# Patient Record
Sex: Male | Born: 1955 | Race: White | Hispanic: No | Marital: Married | Smoking: Never smoker
Health system: Southern US, Community
[De-identification: ages and names within clinical notes are randomized; demographics above are authoritative.]

## PROBLEM LIST (undated history)

## (undated) DIAGNOSIS — F419 Anxiety disorder, unspecified: Secondary | ICD-10-CM

## (undated) DIAGNOSIS — T8859XA Other complications of anesthesia, initial encounter: Secondary | ICD-10-CM

## (undated) DIAGNOSIS — Z860101 Personal history of adenomatous and serrated colon polyps: Secondary | ICD-10-CM

## (undated) DIAGNOSIS — M1990 Unspecified osteoarthritis, unspecified site: Secondary | ICD-10-CM

## (undated) DIAGNOSIS — F909 Attention-deficit hyperactivity disorder, unspecified type: Secondary | ICD-10-CM

## (undated) DIAGNOSIS — Z87898 Personal history of other specified conditions: Secondary | ICD-10-CM

## (undated) DIAGNOSIS — J302 Other seasonal allergic rhinitis: Secondary | ICD-10-CM

## (undated) DIAGNOSIS — Z9889 Other specified postprocedural states: Secondary | ICD-10-CM

## (undated) DIAGNOSIS — E039 Hypothyroidism, unspecified: Secondary | ICD-10-CM

## (undated) DIAGNOSIS — Z86010 Personal history of colonic polyps: Secondary | ICD-10-CM

## (undated) DIAGNOSIS — T4145XA Adverse effect of unspecified anesthetic, initial encounter: Secondary | ICD-10-CM

## (undated) DIAGNOSIS — I10 Essential (primary) hypertension: Secondary | ICD-10-CM

## (undated) DIAGNOSIS — C801 Malignant (primary) neoplasm, unspecified: Secondary | ICD-10-CM

## (undated) DIAGNOSIS — F32A Depression, unspecified: Secondary | ICD-10-CM

## (undated) DIAGNOSIS — Z85828 Personal history of other malignant neoplasm of skin: Secondary | ICD-10-CM

## (undated) DIAGNOSIS — F329 Major depressive disorder, single episode, unspecified: Secondary | ICD-10-CM

## (undated) HISTORY — DX: Essential (primary) hypertension: I10

## (undated) HISTORY — PX: POLYPECTOMY: SHX149

## (undated) HISTORY — PX: CHOLECYSTECTOMY: SHX55

## (undated) HISTORY — DX: Hypothyroidism, unspecified: E03.9

## (undated) HISTORY — DX: Unspecified osteoarthritis, unspecified site: M19.90

## (undated) HISTORY — PX: TOE OSTEOTOMY: SHX1071

## (undated) HISTORY — PX: COLONOSCOPY: SHX174

---

## 1982-09-14 HISTORY — PX: INGUINAL HERNIA REPAIR: SUR1180

## 2011-12-02 ENCOUNTER — Encounter: Payer: Self-pay | Admitting: Internal Medicine

## 2011-12-23 ENCOUNTER — Ambulatory Visit: Payer: Self-pay | Admitting: Internal Medicine

## 2011-12-30 ENCOUNTER — Encounter: Payer: Self-pay | Admitting: Internal Medicine

## 2012-01-01 ENCOUNTER — Encounter: Payer: Self-pay | Admitting: Internal Medicine

## 2012-01-01 ENCOUNTER — Ambulatory Visit (INDEPENDENT_AMBULATORY_CARE_PROVIDER_SITE_OTHER): Payer: BC Managed Care – PPO | Admitting: Internal Medicine

## 2012-01-01 VITALS — BP 130/86 | HR 82 | Ht 68.0 in | Wt 201.0 lb

## 2012-01-01 DIAGNOSIS — Z86010 Personal history of colonic polyps: Secondary | ICD-10-CM | POA: Insufficient documentation

## 2012-01-01 DIAGNOSIS — E039 Hypothyroidism, unspecified: Secondary | ICD-10-CM | POA: Insufficient documentation

## 2012-01-01 DIAGNOSIS — Z1211 Encounter for screening for malignant neoplasm of colon: Secondary | ICD-10-CM

## 2012-01-01 DIAGNOSIS — I10 Essential (primary) hypertension: Secondary | ICD-10-CM | POA: Insufficient documentation

## 2012-01-01 MED ORDER — PEG-KCL-NACL-NASULF-NA ASC-C 100 G PO SOLR: 1.0000 | Freq: Once | ORAL | Status: DC

## 2012-01-01 NOTE — Patient Instructions (Signed)
.  You have been scheduled for a colonoscopy with propofol. Please follow written instructions given to you at your visit today.  Please pick up your prep kit at the pharmacy within the next 1-3 days.  We have sent the following medications to your pharmacy for you to pick up at your convenience: moviprep

## 2012-01-01 NOTE — Progress Notes (Signed)
Subjective:    Patient ID: Robert Blair, male    DOB: 02/16/1956, 56 y.o.   MRN: [id number]  HPI Robert Blair is a 56 yo male with PMH of hypertension and hypothyroidism who is seen given in consultation from Dr. Su Hilt given his history of colon polyps. The patient is doing well today, without GI complaint. He does report very rare scant bright red blood on the toilet tissue. He feels this is due to perianal inflammation/irritation. He says this is worse when he eats a high fiber diet, particularly raw vegetables. Bowel habits for him to been normal, which consists of 4-5 bowel movements per day. Stools are soft but formed. No diarrhea or constipation. No abdominal pain. No change in appetite. No nausea or vomiting. No weight loss. He was recently diagnosed with hypothyroidism, and he was started on levothyroxine and the doses are being adjusted.  He reports having had a colonoscopy 5 years ago, with removal of 2 benign colon polyps. He was recommended that he repeat the colonoscopy in 5 years time.  This was performed by Dr. Matthias Hughs.  Review of Systems As per history of present illness, otherwise negative except for arthritis of great toe  Past Medical History  Diagnosis Date  . Unspecified essential hypertension   . Hypothyroidism   . Personal history of colonic polyps     Benign   Past Surgical History  Procedure Date  . Hernia repair     1984   Current Outpatient Prescriptions  Medication Sig Dispense Refill  . levothyroxine (SYNTHROID, LEVOTHROID) 50 MCG tablet Take 50 mcg by mouth daily.      . valsartan-hydrochlorothiazide (DIOVAN HCT) 320-12.5 MG per tablet Take 1 tablet by mouth daily.      . peg 3350 powder (MOVIPREP) SOLR Take 1 kit (100 g total) by mouth once.  1 kit  0   No Known Allergies  Family History  Problem Relation Age of Onset  . Breast cancer    . Colon polyps -- father     History  Substance Use Topics  . Smoking status: Never Smoker   . Smokeless  tobacco: Never Used  . Alcohol Use: No       Objective:   Physical Exam BP 130/86  Pulse 82  Ht 5\' 8"  (1.727 m)  Wt 201 lb (91.173 kg)  BMI 30.56 kg/m2 Constitutional: Well-developed and well-nourished. No distress. HEENT: Normocephalic and atraumatic. Oropharynx is clear and moist. No oropharyngeal exudate. Conjunctivae are normal. Pupils are equal round and reactive to light. No scleral icterus. Neck: Neck supple. Trachea midline. Cardiovascular: Normal rate, regular rhythm and intact distal pulses. No M/R/G Pulmonary/chest: Effort normal and breath sounds normal. No wheezing, rales or rhonchi. Abdominal: Soft, nontender, nondistended. Bowel sounds active throughout. There are no masses palpable. No hepatosplenomegaly. Extremities: no clubbing, cyanosis, or edema Lymphadenopathy: No cervical adenopathy noted. Neurological: Alert and oriented to person place and time. Skin: Skin is warm and dry. No rashes noted. Psychiatric: Normal mood and affect. Behavior is normal.    Assessment & Plan:  Robert Blair is a 56 yo male with PMH of hypertension and hypothyroidism who is seen given in consultation from Dr. Su Hilt given his history of colon polyps  1. History of colon polyps -- given the recommendation for repeat exam in 5 years, colon polyps that were removed were likely adenomatous. We will track down the colonoscopy and pathology records to confirm this. He is without symptoms today, he does have occasional scant rectal bleeding  which is likely due to perianal inflammation. He is not having bleeding now. I recommend repeating the colonoscopy, given that it has been 5 years since his last exam. We discussed the test today including the risks and benefits and he is agreeable to proceed. Time was provided for questions and answers.

## 2012-01-13 ENCOUNTER — Telehealth: Payer: Self-pay | Admitting: Gastroenterology

## 2012-01-13 NOTE — Telephone Encounter (Signed)
Called pt to confirm appointment time change from 2pm to 10:30am on 01/14/2012, LVM

## 2012-01-14 ENCOUNTER — Encounter: Payer: Self-pay | Admitting: Internal Medicine

## 2012-01-14 ENCOUNTER — Telehealth: Payer: Self-pay

## 2012-01-14 ENCOUNTER — Ambulatory Visit (AMBULATORY_SURGERY_CENTER): Payer: BC Managed Care – PPO | Admitting: Internal Medicine

## 2012-01-14 VITALS — BP 136/89 | HR 70 | Temp 96.7°F | Resp 16 | Ht 68.0 in | Wt 201.0 lb

## 2012-01-14 DIAGNOSIS — Z1211 Encounter for screening for malignant neoplasm of colon: Secondary | ICD-10-CM

## 2012-01-14 DIAGNOSIS — D126 Benign neoplasm of colon, unspecified: Secondary | ICD-10-CM

## 2012-01-14 DIAGNOSIS — Z860100 Personal history of colon polyps, unspecified: Secondary | ICD-10-CM

## 2012-01-14 DIAGNOSIS — D128 Benign neoplasm of rectum: Secondary | ICD-10-CM

## 2012-01-14 DIAGNOSIS — D129 Benign neoplasm of anus and anal canal: Secondary | ICD-10-CM

## 2012-01-14 MED ORDER — SODIUM CHLORIDE 0.9 % IV SOLN: 500.0000 mL | INTRAVENOUS | Status: DC

## 2012-01-14 NOTE — Progress Notes (Signed)
Patient did not have preoperative order for IV antibiotic SSI prophylaxis. (G8918)  Patient did not experience any of the following events: a burn prior to discharge; a fall within the facility; wrong site/side/patient/procedure/implant event; or a hospital transfer or hospital admission upon discharge from the facility. (G8907)  

## 2012-01-14 NOTE — Op Note (Signed)
[facility] [address] [place], [place]  [address]  COLONOSCOPY PROCEDURE REPORT  PATIENT:  Robert Blair, Robert Blair  MR#:  [id number] BIRTHDATE:  November 20, 1955, 56 yrs. old  GENDER:  male ENDOSCOPIST:  Carie Caddy. Dhairya Corales, MD REF. BY:  Burton Apley, M.D. PROCEDURE DATE:  01/14/2012 PROCEDURE:  Colonoscopy with snare polypectomy, Colon with cold biopsy polypectomy ASA CLASS:  Class II INDICATIONS:  surveillance and high-risk screening, history of colon polyps, last colonoscopy 5 years ago MEDICATIONS:   MAC sedation, administered by CRNA, propofol (Diprivan) 300 mg IV  DESCRIPTION OF PROCEDURE:   After the risks benefits and alternatives of the procedure were thoroughly explained, informed consent was obtained.  Digital rectal exam was performed and revealed no rectal masses.   The LB [id number] endoscope was introduced through the anus and advanced to the terminal ileum which was intubated for a short distance, without limitations. The quality of the prep was good, using MoviPrep.  The instrument was then slowly withdrawn as the colon was fully examined. <<PROCEDUREIMAGES>>  FINDINGS:  The terminal ileum appeared normal.  A 2 mm sessile polyp was found at the appendiceal orifice. Polyp was snared without cautery. Retrieval was successful.  Four sessile polyps measuring 3 - 5 mm were found in the ascending colon. Polyps were snared without cautery. Retrieval was successful.  Two sessile polyps measuring 3 - 5 mm were found in the descending colon. Polyp was snared without cautery. Retrieval was successful.  The polyp was removed using cold biopsy forceps.  Mild diverticulosis was found in the left colon.   Retroflexed views in the rectum revealed small internal hemorrhoids.   The scope was then withdrawn from the cecum and the procedure completed.  COMPLICATIONS:  None ENDOSCOPIC IMPRESSION: 1) Normal terminal ileum 2) Sessile polyp at the appendiceal orifice.  Removed  and sent to pathology. 3) Four polyps in the ascending colon.  Removed and sent to pathology. 4) Two polyps in the descending colon/  Removed and sent to pathology. 5) Mild diverticulosis in the left colon 6) Small internal hemorrhoids  RECOMMENDATIONS: 1) Hold aspirin, aspirin products, and anti-inflammatory medication for 1 week. 2) Await pathology results 3) High fiber diet. 4) If the polyps removed today are proven to be adenomatous (pre-cancerous) polyps, you will need a colonoscopy in 3 years. Otherwise you should have a repeat colonoscopy in 5 years given your history of previous colon polyps. You will receive a letter within 1-2 weeks with the results of your biopsy as well as final recommendations. Please call my office if you have not received a letter after 3 weeks.  Carie Caddy. Rhea Belton, MD  CC:  The Patient Burton Apley, MD  n. Rosalie DoctorCarie Caddy. Tieasha Larsen at 01/14/2012 11:12 AM  Adair Laundry, [phone]

## 2012-01-14 NOTE — Patient Instructions (Signed)
YOU HAD AN ENDOSCOPIC PROCEDURE TODAY AT THE [facility]: Refer to the procedure report that was given to you for any specific questions about what was found during the examination.  If the procedure report does not answer your questions, please call your gastroenterologist to clarify.  If you requested that your care partner not be given the details of your procedure findings, then the procedure report has been included in a sealed envelope for you to review at your convenience later.  YOU SHOULD EXPECT: Some feelings of bloating in the abdomen. Passage of more gas than usual.  Walking can help get rid of the air that was put into your GI tract during the procedure and reduce the bloating. If you had a lower endoscopy (such as a colonoscopy or flexible sigmoidoscopy) you may notice spotting of blood in your stool or on the toilet paper. If you underwent a bowel prep for your procedure, then you may not have a normal bowel movement for a few days.  DIET: Your first meal following the procedure should be a light meal and then it is ok to progress to your normal diet.  A half-sandwich or bowl of soup is an example of a good first meal.  Heavy or fried foods are harder to digest and may make you feel nauseous or bloated.  Likewise meals heavy in dairy and vegetables can cause extra gas to form and this can also increase the bloating.  Drink plenty of fluids but you should avoid alcoholic beverages for 24 hours.  ACTIVITY: Your care partner should take you home directly after the procedure.  You should plan to take it easy, moving slowly for the rest of the day.  You can resume normal activity the day after the procedure however you should NOT DRIVE or use heavy machinery for 24 hours (because of the sedation medicines used during the test).    SYMPTOMS TO REPORT IMMEDIATELY: A gastroenterologist can be reached at any hour.  During normal business hours, 8:30 AM to 5:00 PM Monday through Friday,  call [phone].  After hours and on weekends, please call the GI answering service at [phone] who will take a message and have the physician on call contact you.   Following lower endoscopy (colonoscopy or flexible sigmoidoscopy):  Excessive amounts of blood in the stool  Significant tenderness or worsening of abdominal pains  Swelling of the abdomen that is new, acute  Fever of 100F or higher    FOLLOW UP: If any biopsies were taken you will be contacted by phone or by letter within the next 1-3 weeks.  Call your gastroenterologist if you have not heard about the biopsies in 3 weeks.  Our staff will call the home number listed on your records the next business day following your procedure to check on you and address any questions or concerns that you may have at that time regarding the information given to you following your procedure. This is a courtesy call and so if there is no answer at the home number and we have not heard from you through the emergency physician on call, we will assume that you have returned to your regular daily activities without incident.  SIGNATURES/CONFIDENTIALITY: You and/or your care partner have signed paperwork which will be entered into your electronic medical record.  These signatures attest to the fact that that the information above on your After Visit Summary has been reviewed and is understood.  Full responsibility of the confidentiality   of this discharge information lies with you and/or your care-partner.     

## 2012-01-14 NOTE — Telephone Encounter (Signed)
Verified patient knew about his appointment time change;  pt acknowledged the change

## 2012-01-15 ENCOUNTER — Telehealth: Payer: Self-pay | Admitting: *Deleted

## 2012-01-15 NOTE — Telephone Encounter (Signed)
  Follow up Call-  Call back number 01/14/2012  Post procedure Call Back phone  # [phone]  Permission to leave phone message Yes     Patient questions:  Do you have a fever, pain , or abdominal swelling? no Pain Score  0 *  Have you tolerated food without any problems? yes  Have you been able to return to your normal activities? yes  Do you have any questions about your discharge instructions: Diet   no Medications  no Follow up visit  no  Do you have questions or concerns about your Care? no  Actions: * If pain score is 4 or above: No action needed, pain <4.

## 2012-01-19 ENCOUNTER — Encounter: Payer: Self-pay | Admitting: Internal Medicine

## 2013-01-06 DIAGNOSIS — L988 Other specified disorders of the skin and subcutaneous tissue: Secondary | ICD-10-CM | POA: Insufficient documentation

## 2013-01-06 DIAGNOSIS — L574 Cutis laxa senilis: Secondary | ICD-10-CM | POA: Insufficient documentation

## 2013-07-07 DIAGNOSIS — Z0000 Encounter for general adult medical examination without abnormal findings: Secondary | ICD-10-CM | POA: Insufficient documentation

## 2014-05-16 ENCOUNTER — Other Ambulatory Visit: Payer: Self-pay | Admitting: Internal Medicine

## 2014-05-16 ENCOUNTER — Ambulatory Visit
Admission: RE | Admit: 2014-05-16 | Discharge: 2014-05-16 | Disposition: A | Discharge status: Home / Self Care | Payer: BC Managed Care – PPO | Source: Ambulatory Visit | Attending: Internal Medicine | Admitting: Internal Medicine

## 2014-05-16 DIAGNOSIS — M79645 Pain in left finger(s): Secondary | ICD-10-CM

## 2014-05-16 DIAGNOSIS — W19XXXA Unspecified fall, initial encounter: Secondary | ICD-10-CM

## 2014-09-14 DIAGNOSIS — C801 Malignant (primary) neoplasm, unspecified: Secondary | ICD-10-CM

## 2014-09-14 HISTORY — DX: Malignant (primary) neoplasm, unspecified: C80.1

## 2014-10-03 ENCOUNTER — Other Ambulatory Visit (INDEPENDENT_AMBULATORY_CARE_PROVIDER_SITE_OTHER): Payer: Self-pay | Admitting: General Surgery

## 2014-10-23 ENCOUNTER — Other Ambulatory Visit (HOSPITAL_COMMUNITY): Payer: Self-pay | Admitting: *Deleted

## 2014-10-24 ENCOUNTER — Encounter (HOSPITAL_COMMUNITY)
Admission: RE | Admit: 2014-10-24 | Discharge: 2014-10-24 | Disposition: A | Discharge status: Home / Self Care | Payer: BLUE CROSS/BLUE SHIELD | Source: Ambulatory Visit | Attending: General Surgery | Admitting: General Surgery

## 2014-10-24 ENCOUNTER — Encounter (HOSPITAL_COMMUNITY): Payer: Self-pay

## 2014-10-24 DIAGNOSIS — Z01818 Encounter for other preprocedural examination: Secondary | ICD-10-CM | POA: Diagnosis not present

## 2014-10-24 DIAGNOSIS — K4091 Unilateral inguinal hernia, without obstruction or gangrene, recurrent: Secondary | ICD-10-CM | POA: Diagnosis not present

## 2014-10-24 HISTORY — DX: Malignant (primary) neoplasm, unspecified: C80.1

## 2014-10-24 HISTORY — DX: Major depressive disorder, single episode, unspecified: F32.9

## 2014-10-24 HISTORY — DX: Depression, unspecified: F32.A

## 2014-10-24 LAB — CBC WITH DIFFERENTIAL/PLATELET
Basophils Absolute: 0 10*3/uL (ref 0.0–0.1)
Basophils Relative: 0 % (ref 0–1)
Eosinophils Absolute: 0.2 10*3/uL (ref 0.0–0.7)
Eosinophils Relative: 2 % (ref 0–5)
HEMATOCRIT: 45.5 % (ref 39.0–52.0)
Hemoglobin: 15.8 g/dL (ref 13.0–17.0)
LYMPHS ABS: 2.1 10*3/uL (ref 0.7–4.0)
LYMPHS PCT: 23 % (ref 12–46)
MCH: 30.2 pg (ref 26.0–34.0)
MCHC: 34.7 g/dL (ref 30.0–36.0)
MCV: 86.8 fL (ref 78.0–100.0)
Monocytes Absolute: 0.8 10*3/uL (ref 0.1–1.0)
Monocytes Relative: 9 % (ref 3–12)
Neutro Abs: 6 10*3/uL (ref 1.7–7.7)
Neutrophils Relative %: 66 % (ref 43–77)
PLATELETS: 243 10*3/uL (ref 150–400)
RBC: 5.24 MIL/uL (ref 4.22–5.81)
RDW: 13.5 % (ref 11.5–15.5)
WBC: 9.1 10*3/uL (ref 4.0–10.5)

## 2014-10-24 LAB — COMPREHENSIVE METABOLIC PANEL
ALBUMIN: 4.5 g/dL (ref 3.5–5.2)
ALT: 46 U/L (ref 0–53)
ANION GAP: 7 (ref 5–15)
AST: 33 U/L (ref 0–37)
Alkaline Phosphatase: 72 U/L (ref 39–117)
BUN: 19 mg/dL (ref 6–23)
CALCIUM: 9.8 mg/dL (ref 8.4–10.5)
CO2: 32 mmol/L (ref 19–32)
CREATININE: 1.12 mg/dL (ref 0.50–1.35)
Chloride: 101 mmol/L (ref 96–112)
GFR calc Af Amer: 81 mL/min — ABNORMAL LOW (ref >90)
GFR, EST NON AFRICAN AMERICAN: 70 mL/min — AB (ref >90)
Glucose, Bld: 97 mg/dL (ref 70–99)
Potassium: 3.3 mmol/L — ABNORMAL LOW (ref 3.5–5.1)
Sodium: 140 mmol/L (ref 135–145)
Total Bilirubin: 0.7 mg/dL (ref 0.3–1.2)
Total Protein: 7.5 g/dL (ref 6.0–8.3)

## 2014-10-24 NOTE — Patient Instructions (Signed)
[place]  10/24/2014   Your procedure is scheduled on:    Monday October 29, 2014   Report to [facility] Main Entrance and follow signs to  [place] arrive at 5:30 AM.  Call this number if you have problems the morning of surgery [phone] or Presurgical Testing [phone].   Remember:  Do not eat food or drink liquids :After Midnight.  For Living Will and/or Health Care Power Attorney Forms: please provide copy for your medical record, may bring AM of surgery (forms should be already notarized-we do not provide this service).     Take these medicines the morning of surgery with A SIP OF WATER: Amlodipine;Bupropion (Wellbutrin);Levothyroxine                                You may not have any metal on your body including hair pins and piercings  Do not wear jewelry, cologne, lotions, powders, or deodorant.  Men may shave face and neck.               Do not bring valuables to the hospital. [place].  Contacts, dentures or bridgework may not be worn into surgery.    Patients discharged the day of surgery will not be allowed to drive home.  Name and phone number of your driver:Brenda Chana Bode (friend) or Beola Cord (girlfriend)  ________________________________________________________________________  [facility] - Preparing for Surgery Before surgery, you can play an important role.  Because skin is not sterile, your skin needs to be as free of germs as possible.  You can reduce the number of germs on your skin by washing with CHG (chlorahexidine gluconate) soap before surgery.  CHG is an antiseptic cleaner which kills germs and bonds with the skin to continue killing germs even after washing. Please DO NOT use if you have an allergy to CHG or antibacterial soaps.  If your skin becomes reddened/irritated stop using the CHG and inform your nurse when you arrive at Short Stay. Do not shave (including legs and underarms)  for at least 48 hours prior to the first CHG shower.  You may shave your face/neck. Please follow these instructions carefully:  1.  Shower with CHG Soap the night before surgery and the  morning of Surgery.  2.  If you choose to wash your hair, wash your hair first as usual with your  normal  shampoo.  3.  After you shampoo, rinse your hair and body thoroughly to remove the  shampoo.                           4.  Use CHG as you would any other liquid soap.  You can apply chg directly  to the skin and wash                       Gently with a scrungie or clean washcloth.  5.  Apply the CHG Soap to your body ONLY FROM THE NECK DOWN.   Do not use on face/ open                           Wound or open sores. Avoid contact with eyes, ears mouth and genitals (private parts).  Wash face,  Genitals (private parts) with your normal soap.             6.  Wash thoroughly, paying special attention to the area where your surgery  will be performed.  7.  Thoroughly rinse your body with warm water from the neck down.  8.  DO NOT shower/wash with your normal soap after using and rinsing off  the CHG Soap.                9.  Pat yourself dry with a clean towel.            10.  Wear clean pajamas.            11.  Place clean sheets on your bed the night of your first shower and do not  sleep with pets. Day of Surgery : Do not apply any lotions/deodorants the morning of surgery.  Please wear clean clothes to the hospital/surgery center.  FAILURE TO FOLLOW THESE INSTRUCTIONS MAY RESULT IN THE CANCELLATION OF YOUR SURGERY PATIENT SIGNATURE_________________________________  NURSE SIGNATURE__________________________________  ________________________________________________________________________

## 2014-10-24 NOTE — Progress Notes (Signed)
EKG per chart 11/30/2013 per Dr Mancel Bale

## 2014-10-28 NOTE — H&P (Signed)
Robert Blair [facility]  Location: [facility] Patient #: [id number] DOB: August 29, 1956 Single / Language: Cleophus Molt / Race: White Male      History of Present Illness  Patient words: RIH.  The patient is a 58 year old male who presents with a complaint of recurrent right inguinal hernia. This is a very pleasant, 59 year old Caucasian male, referred by Dr. [name] for evaluation and management of a symptomatic, recurrent right inguinal hernia. The patient underwent open repair of right inguinal hernia with mesh, per his report, in the mid 80s. He states that he has known he had a recurrence for about 2 years but it did not bother him. He underwent repair of a right foot fracture in September 2015. He was trying to get back in his SUV and slipped and felt increased pain in his right groin and the bulge was much bigger. He's been wearing a truss and feels much better now. No nausea vomiting or abdominal pain. Comorbidities include hypertension and hypothyroidism. No history of abdominal surgery.   Addendum Note  I have discussed the indications, details, techniques and numerous risk of inguinal hernia surgery with him. He is aware of the risk of bleeding, infection, recurrence, nerve damage with chronic pain, open laparotomy, and other unforeseen problems. All of his questions were answered. He understands all of these issues. He agrees with this plan.    Other Problems  Depression Inguinal Hernia Thyroid Disease  Past Surgical History Colon Polyp Removal - Colonoscopy Oral Surgery  Diagnostic Studies History  Colonoscopy 1-5 years ago  Allergies  No Known Drug Allergies01/20/2016  Medication History  AmLODIPine Besylate (10MG  Tablet, Oral) Active. BuPROPion HCl ER (XL) (150MG  Tablet ER 24HR, Oral) Active. Levothyroxine Sodium (100MCG Tablet, Oral) Active. Valsartan-Hydrochlorothiazide (320-25MG  Tablet, Oral) Active.  Social History  Alcohol  use Occasional alcohol use. Caffeine use Tea. No drug use Tobacco use Never smoker.  Family History  Alcohol Abuse Father. Breast Cancer Mother. Cerebrovascular Accident Father. Colon Polyps Father. Depression Mother. Heart Disease Mother. Heart disease in male family member before age 48 Hypertension Father, Mother.  Review of Systems General Not Present- Appetite Loss, Chills, Fatigue, Fever, Night Sweats, Weight Gain and Weight Loss. Skin Not Present- Change in Wart/Mole, Dryness, Hives, Jaundice, New Lesions, Non-Healing Wounds, Rash and Ulcer. HEENT Not Present- Earache, Hearing Loss, Hoarseness, Nose Bleed, Oral Ulcers, Ringing in the Ears, Seasonal Allergies, Sinus Pain, Sore Throat, Visual Disturbances, Wears glasses/contact lenses and Yellow Eyes. Respiratory Not Present- Bloody sputum, Chronic Cough, Difficulty Breathing, Snoring and Wheezing. Breast Not Present- Breast Mass, Breast Pain, Nipple Discharge and Skin Changes. Cardiovascular Not Present- Chest Pain, Difficulty Breathing Lying Down, Leg Cramps, Palpitations, Rapid Heart Rate, Shortness of Breath and Swelling of Extremities. Gastrointestinal Not Present- Abdominal Pain, Bloating, Bloody Stool, Change in Bowel Habits, Chronic diarrhea, Constipation, Difficulty Swallowing, Excessive gas, Gets full quickly at meals, Hemorrhoids, Indigestion, Nausea, Rectal Pain and Vomiting. Male Genitourinary Not Present- Blood in Urine, Change in Urinary Stream, Frequency, Impotence, Nocturia, Painful Urination, Urgency and Urine Leakage.   Vitals   Weight: 198 lb Height: 68in Body Surface Area: 2.08 m Body Mass Index: 30.11 kg/m Pulse: 80 (Regular)  BP: 126/78 (Sitting, Left Arm, Standard)    Physical Exam  General Mental Status-Alert. General Appearance-Consistent with stated age. Hydration-Well hydrated. Voice-Normal.  Head and Neck Head-normocephalic, atraumatic with no lesions or  palpable masses. Trachea-midline. Thyroid Gland Characteristics - normal size and consistency.  Eye Eyeball - Bilateral-Extraocular movements intact. Sclera/Conjunctiva - Bilateral-No scleral icterus.  Chest and Lung Exam Chest and lung exam reveals -quiet, even and easy respiratory effort with no use of accessory muscles and on auscultation, normal breath sounds, no adventitious sounds and normal vocal resonance. Inspection Chest Wall - Normal. Back - normal.  Cardiovascular Cardiovascular examination reveals -normal heart sounds, regular rate and rhythm with no murmurs and normal pedal pulses bilaterally.  Abdomen Inspection Inspection of the abdomen reveals - No Hernias. Skin - Scar - no surgical scars. Palpation/Percussion Palpation and Percussion of the abdomen reveal - Soft, Non Tender, No Rebound tenderness, No Rigidity (guarding) and No hepatosplenomegaly. Auscultation Auscultation of the abdomen reveals - Bowel sounds normal. Note: Umbilicus normal. No hernia. No scars.   Male Genitourinary Note: Moderate sized right inguinal hernia. Obvious when standing. Does not extend into the scrotum but comes close. A little bit tender but easily reducible. No hernia on the left. No scrotal or testicular mass. Well-healed transverse scar right inguinal area consistent with his history of open repair   Neurologic Neurologic evaluation reveals -alert and oriented x 3 with no impairment of recent or remote memory. Mental Status-Normal.  Musculoskeletal Normal Exam - Left-Upper Extremity Strength Normal and Lower Extremity Strength Normal. Normal Exam - Right-Upper Extremity Strength Normal and Lower Extremity Strength Normal.  Lymphatic Head & Neck  General Head & Neck Lymphatics: Bilateral - Description - Normal. Axillary  General Axillary Region: Bilateral - Description - Normal. Tenderness - Non Tender. Femoral & Inguinal  Generalized Femoral &  Inguinal Lymphatics: Bilateral - Description - Normal. Tenderness - Non Tender.    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 you will be scheduled for laparoscopic repair of your recurrent right inguinal hernia with mesh, possible open repair in the near future We have discussed the techniques and risks of this surgery in detail Please read the patient information booklets that I gave you.  HYPERTENSION, BENIGN (Can not exclude) (401.1  I10) Impression: Controlled on Diovan HCT  HISTORY OF RIGHT INGUINAL HERNIA REPAIR (V45.89  U98.11) Impression: Lichtenstein repair with mesh, 1980s, per history  HYPOTHYROIDISM, UNSPECIFIED TYPE (244.9  E03.9)   Jeymi Hepp M. Dalbert Batman, M.D., [facility] General and Minimally invasive Surgery Breast and Colorectal Surgery Office:   [phone] Pager:   [phone]

## 2014-10-28 NOTE — Anesthesia Preprocedure Evaluation (Addendum)
Anesthesia Evaluation  Patient identified by MRN, date of birth, ID band Patient awake    Reviewed: Allergy & Precautions, H&P , NPO status , Patient's Chart, lab work & pertinent test results  Airway Mallampati: II  TM Distance: >3 FB Neck ROM: full    Dental no notable dental hx. (+) Teeth Intact, Dental Advisory Given   Pulmonary neg pulmonary ROS,  breath sounds clear to auscultation  Pulmonary exam normal       Cardiovascular Exercise Tolerance: Good hypertension, Pt. on medications Rhythm:regular Rate:Normal     Neuro/Psych negative neurological ROS  negative psych ROS   GI/Hepatic negative GI ROS, Neg liver ROS,   Endo/Other  negative endocrine ROSHypothyroidism   Renal/GU negative Renal ROS  negative genitourinary   Musculoskeletal   Abdominal   Peds  Hematology negative hematology ROS (+)   Anesthesia Other Findings   Reproductive/Obstetrics negative OB ROS                            Anesthesia Physical Anesthesia Plan  ASA: II  Anesthesia Plan: General   Post-op Pain Management:    Induction: Intravenous  Airway Management Planned: Oral ETT  Additional Equipment:   Intra-op Plan:   Post-operative Plan: Extubation in OR  Informed Consent: I have reviewed the patients History and Physical, chart, labs and discussed the procedure including the risks, benefits and alternatives for the proposed anesthesia with the patient or authorized representative who has indicated his/her understanding and acceptance.   Dental Advisory Given  Plan Discussed with: CRNA and Surgeon  Anesthesia Plan Comments:         Anesthesia Quick Evaluation

## 2014-10-29 ENCOUNTER — Ambulatory Visit (HOSPITAL_COMMUNITY): Payer: BLUE CROSS/BLUE SHIELD | Admitting: Anesthesiology

## 2014-10-29 ENCOUNTER — Encounter (HOSPITAL_COMMUNITY)
Admission: RE | Disposition: A | Discharge status: Home / Self Care | Payer: Self-pay | Source: Ambulatory Visit | Attending: General Surgery

## 2014-10-29 ENCOUNTER — Encounter (HOSPITAL_COMMUNITY): Payer: Self-pay | Admitting: Anesthesiology

## 2014-10-29 ENCOUNTER — Ambulatory Visit (HOSPITAL_COMMUNITY)
Admission: RE | Admit: 2014-10-29 | Discharge: 2014-10-29 | Disposition: A | Discharge status: Home / Self Care | Payer: BLUE CROSS/BLUE SHIELD | Source: Ambulatory Visit | Attending: General Surgery | Admitting: General Surgery

## 2014-10-29 DIAGNOSIS — I10 Essential (primary) hypertension: Secondary | ICD-10-CM | POA: Insufficient documentation

## 2014-10-29 DIAGNOSIS — F329 Major depressive disorder, single episode, unspecified: Secondary | ICD-10-CM | POA: Diagnosis not present

## 2014-10-29 DIAGNOSIS — Z79899 Other long term (current) drug therapy: Secondary | ICD-10-CM | POA: Diagnosis not present

## 2014-10-29 DIAGNOSIS — E039 Hypothyroidism, unspecified: Secondary | ICD-10-CM | POA: Insufficient documentation

## 2014-10-29 DIAGNOSIS — Z8249 Family history of ischemic heart disease and other diseases of the circulatory system: Secondary | ICD-10-CM | POA: Insufficient documentation

## 2014-10-29 DIAGNOSIS — K4091 Unilateral inguinal hernia, without obstruction or gangrene, recurrent: Secondary | ICD-10-CM | POA: Diagnosis present

## 2014-10-29 HISTORY — PX: INGUINAL HERNIA REPAIR: SHX194

## 2014-10-29 HISTORY — PX: INSERTION OF MESH: SHX5868

## 2014-10-29 SURGERY — REPAIR, HERNIA, INGUINAL, LAPAROSCOPIC
Anesthesia: General | Site: Groin | Laterality: Right

## 2014-10-29 MED ORDER — BUPIVACAINE-EPINEPHRINE 0.5% -1:200000 IJ SOLN
INTRAMUSCULAR | Status: DC | PRN
  Administered 2014-10-29: 10 mL

## 2014-10-29 MED ORDER — DEXAMETHASONE SODIUM PHOSPHATE 10 MG/ML IJ SOLN
INTRAMUSCULAR | Status: DC | PRN
Start: 2014-10-29 — End: 2014-10-29
  Administered 2014-10-29: 10 mg via INTRAVENOUS

## 2014-10-29 MED ORDER — PROPOFOL 10 MG/ML IV BOLUS
INTRAVENOUS | Status: AC
  Filled 2014-10-29: qty 20

## 2014-10-29 MED ORDER — MIDAZOLAM HCL 2 MG/2ML IJ SOLN
INTRAMUSCULAR | Status: AC
  Filled 2014-10-29: qty 2

## 2014-10-29 MED ORDER — LACTATED RINGERS IV SOLN: INTRAVENOUS | Status: DC

## 2014-10-29 MED ORDER — DEXAMETHASONE SODIUM PHOSPHATE 10 MG/ML IJ SOLN
INTRAMUSCULAR | Status: AC
  Filled 2014-10-29: qty 1

## 2014-10-29 MED ORDER — CHLORHEXIDINE GLUCONATE 4 % EX LIQD: 1.0000 "application " | Freq: Once | CUTANEOUS | Status: DC

## 2014-10-29 MED ORDER — LIDOCAINE HCL (CARDIAC) 20 MG/ML IV SOLN
INTRAVENOUS | Status: AC
  Filled 2014-10-29: qty 5

## 2014-10-29 MED ORDER — SODIUM CHLORIDE 0.9 % IV SOLN: 250.0000 mL | INTRAVENOUS | Status: DC | PRN

## 2014-10-29 MED ORDER — NEOSTIGMINE METHYLSULFATE 10 MG/10ML IV SOLN
INTRAVENOUS | Status: DC | PRN
  Administered 2014-10-29: 4 mg via INTRAVENOUS

## 2014-10-29 MED ORDER — 0.9 % SODIUM CHLORIDE (POUR BTL) OPTIME
TOPICAL | Status: DC | PRN
  Administered 2014-10-29: 1000 mL

## 2014-10-29 MED ORDER — GLYCOPYRROLATE 0.2 MG/ML IJ SOLN
INTRAMUSCULAR | Status: DC | PRN
  Administered 2014-10-29: .8 mg via INTRAVENOUS

## 2014-10-29 MED ORDER — HYDROMORPHONE HCL 1 MG/ML IJ SOLN
INTRAMUSCULAR | Status: DC | PRN
  Administered 2014-10-29: 0.5 mg via INTRAVENOUS

## 2014-10-29 MED ORDER — ROCURONIUM BROMIDE 100 MG/10ML IV SOLN
INTRAVENOUS | Status: AC
  Filled 2014-10-29: qty 1

## 2014-10-29 MED ORDER — PROMETHAZINE HCL 25 MG/ML IJ SOLN
INTRAMUSCULAR | Status: AC
  Filled 2014-10-29: qty 1

## 2014-10-29 MED ORDER — FENTANYL CITRATE 0.05 MG/ML IJ SOLN
INTRAMUSCULAR | Status: DC | PRN
  Administered 2014-10-29 (×2): 50 ug via INTRAVENOUS
  Administered 2014-10-29: 100 ug via INTRAVENOUS

## 2014-10-29 MED ORDER — MIDAZOLAM HCL 5 MG/5ML IJ SOLN
INTRAMUSCULAR | Status: DC | PRN
  Administered 2014-10-29: 2 mg via INTRAVENOUS

## 2014-10-29 MED ORDER — OXYCODONE-ACETAMINOPHEN 7.5-325 MG PO TABS: 1.0000 | ORAL_TABLET | ORAL | Status: DC | PRN

## 2014-10-29 MED ORDER — GLYCOPYRROLATE 0.2 MG/ML IJ SOLN
INTRAMUSCULAR | Status: AC
  Filled 2014-10-29: qty 4

## 2014-10-29 MED ORDER — LACTATED RINGERS IR SOLN
Status: DC | PRN
  Administered 2014-10-29: 1000 mL

## 2014-10-29 MED ORDER — PROPOFOL 10 MG/ML IV BOLUS
INTRAVENOUS | Status: DC | PRN
  Administered 2014-10-29: 200 mg via INTRAVENOUS

## 2014-10-29 MED ORDER — ONDANSETRON HCL 4 MG/2ML IJ SOLN
INTRAMUSCULAR | Status: AC
  Filled 2014-10-29: qty 2

## 2014-10-29 MED ORDER — LIDOCAINE HCL (CARDIAC) 20 MG/ML IV SOLN
INTRAVENOUS | Status: DC | PRN
  Administered 2014-10-29: 50 mg via INTRAVENOUS

## 2014-10-29 MED ORDER — FENTANYL CITRATE 0.05 MG/ML IJ SOLN
INTRAMUSCULAR | Status: AC
  Filled 2014-10-29: qty 5

## 2014-10-29 MED ORDER — HYDROMORPHONE HCL 2 MG/ML IJ SOLN
INTRAMUSCULAR | Status: AC
  Filled 2014-10-29: qty 1

## 2014-10-29 MED ORDER — HYDROMORPHONE HCL 1 MG/ML IJ SOLN
0.2500 mg | INTRAMUSCULAR | Status: DC | PRN
  Administered 2014-10-29 (×2): 0.5 mg via INTRAVENOUS

## 2014-10-29 MED ORDER — OXYCODONE HCL 5 MG PO TABS: 5.0000 mg | ORAL_TABLET | ORAL | Status: DC | PRN

## 2014-10-29 MED ORDER — BUPIVACAINE-EPINEPHRINE (PF) 0.5% -1:200000 IJ SOLN
INTRAMUSCULAR | Status: AC
  Filled 2014-10-29: qty 30

## 2014-10-29 MED ORDER — CEFAZOLIN SODIUM-DEXTROSE 2-3 GM-% IV SOLR
2.0000 g | INTRAVENOUS | Status: AC
  Administered 2014-10-29: 2 g via INTRAVENOUS

## 2014-10-29 MED ORDER — CEFAZOLIN SODIUM-DEXTROSE 2-3 GM-% IV SOLR
INTRAVENOUS | Status: AC
  Filled 2014-10-29: qty 50

## 2014-10-29 MED ORDER — SODIUM CHLORIDE 0.9 % IJ SOLN: 3.0000 mL | INTRAMUSCULAR | Status: DC | PRN

## 2014-10-29 MED ORDER — LACTATED RINGERS IV SOLN
INTRAVENOUS | Status: DC | PRN
  Administered 2014-10-29: 07:00:00 via INTRAVENOUS

## 2014-10-29 MED ORDER — ROCURONIUM BROMIDE 100 MG/10ML IV SOLN
INTRAVENOUS | Status: DC | PRN
  Administered 2014-10-29: 50 mg via INTRAVENOUS
  Administered 2014-10-29: 10 mg via INTRAVENOUS

## 2014-10-29 MED ORDER — NEOSTIGMINE METHYLSULFATE 10 MG/10ML IV SOLN
INTRAVENOUS | Status: AC
  Filled 2014-10-29: qty 1

## 2014-10-29 MED ORDER — SODIUM CHLORIDE 0.9 % IJ SOLN
3.0000 mL | Freq: Two times a day (BID) | INTRAMUSCULAR | Status: DC
Start: 2014-10-29 — End: 2014-10-29

## 2014-10-29 MED ORDER — SODIUM CHLORIDE 0.9 % IV SOLN: INTRAVENOUS | Status: DC

## 2014-10-29 MED ORDER — ACETAMINOPHEN 10 MG/ML IV SOLN
1000.0000 mg | Freq: Once | INTRAVENOUS | Status: AC
  Administered 2014-10-29: 1000 mg via INTRAVENOUS
  Filled 2014-10-29: qty 100

## 2014-10-29 MED ORDER — BUPIVACAINE-EPINEPHRINE (PF) 0.25% -1:200000 IJ SOLN
INTRAMUSCULAR | Status: AC
  Filled 2014-10-29: qty 30

## 2014-10-29 MED ORDER — BACITRACIN ZINC 500 UNIT/GM EX OINT
TOPICAL_OINTMENT | CUTANEOUS | Status: AC
  Filled 2014-10-29: qty 28.35

## 2014-10-29 MED ORDER — HYDROMORPHONE HCL 1 MG/ML IJ SOLN
INTRAMUSCULAR | Status: AC
  Filled 2014-10-29: qty 1

## 2014-10-29 MED ORDER — PROMETHAZINE HCL 25 MG/ML IJ SOLN
6.2500 mg | Freq: Four times a day (QID) | INTRAMUSCULAR | Status: AC | PRN
  Administered 2014-10-29: 6.25 mg via INTRAVENOUS

## 2014-10-29 MED ORDER — ONDANSETRON HCL 4 MG/2ML IJ SOLN
INTRAMUSCULAR | Status: DC | PRN
  Administered 2014-10-29: 4 mg via INTRAVENOUS

## 2014-10-29 SURGICAL SUPPLY — 32 items
APPLIER CLIP LOGIC TI 5 (MISCELLANEOUS) ×3 IMPLANT
BENZOIN TINCTURE PRP APPL 2/3 (GAUZE/BANDAGES/DRESSINGS) IMPLANT
CABLE HIGH FREQUENCY MONO STRZ (ELECTRODE) ×6 IMPLANT
DECANTER SPIKE VIAL GLASS SM (MISCELLANEOUS) IMPLANT
DISSECT BALLN SPACEMKR + OVL (BALLOONS) ×3
DISSECTOR BALLN SPACEMKR + OVL (BALLOONS) ×2 IMPLANT
DISSECTOR BLUNT TIP ENDO 5MM (MISCELLANEOUS) IMPLANT
DRAPE LAPAROSCOPIC ABDOMINAL (DRAPES) ×3 IMPLANT
DRAPE WARM FLUID 44X44 (DRAPE) IMPLANT
ELECT REM PT RETURN 9FT ADLT (ELECTROSURGICAL) ×3
ELECTRODE REM PT RTRN 9FT ADLT (ELECTROSURGICAL) ×2 IMPLANT
GLOVE EUDERMIC 7 POWDERFREE (GLOVE) ×3 IMPLANT
GOWN STRL REUS W/TWL XL LVL3 (GOWN DISPOSABLE) ×9 IMPLANT
KIT BASIN OR (CUSTOM PROCEDURE TRAY) ×3 IMPLANT
LIQUID BAND (GAUZE/BANDAGES/DRESSINGS) ×3 IMPLANT
MESH 3DMAX LIGHT 4.1X6.2 RT LR (Mesh General) ×3 IMPLANT
NEEDLE INSUFFLATION 14GA 120MM (NEEDLE) ×3 IMPLANT
SCISSORS LAP 5X35 DISP (ENDOMECHANICALS) ×3 IMPLANT
SET IRRIG TUBING LAPAROSCOPIC (IRRIGATION / IRRIGATOR) IMPLANT
SOLUTION ANTI FOG 6CC (MISCELLANEOUS) ×3 IMPLANT
STOPCOCK 4 WAY LG BORE MALE ST (IV SETS) ×3 IMPLANT
STRIP CLOSURE SKIN 1/2X4 (GAUZE/BANDAGES/DRESSINGS) IMPLANT
SUT MNCRL AB 4-0 PS2 18 (SUTURE) ×3 IMPLANT
SUT VIC AB 3-0 SH 27 (SUTURE)
SUT VIC AB 3-0 SH 27XBRD (SUTURE) IMPLANT
TACKER 5MM HERNIA 3.5CML NAB (ENDOMECHANICALS) ×3 IMPLANT
TOWEL OR 17X26 10 PK STRL BLUE (TOWEL DISPOSABLE) ×3 IMPLANT
TOWEL OR NON WOVEN STRL DISP B (DISPOSABLE) ×3 IMPLANT
TRAY FOLEY CATH 14FRSI W/METER (CATHETERS) ×3 IMPLANT
TRAY LAPAROSCOPIC (CUSTOM PROCEDURE TRAY) ×3 IMPLANT
TROCAR CANNULA W/PORT DUAL 5MM (MISCELLANEOUS) ×3 IMPLANT
TUBING INSUFFLATION 10FT LAP (TUBING) ×3 IMPLANT

## 2014-10-29 NOTE — Anesthesia Postprocedure Evaluation (Signed)
  Anesthesia Post-op Note  Patient: Robert Blair  Procedure(s) Performed: Procedure(s) (LRB): LAPAROSCOPIC REPAIR RECURRENT RIGHT INGUINAL  (Right) INSERTION OF MESH (Right)  Patient Location: PACU  Anesthesia Type: General  Level of Consciousness: awake and alert   Airway and Oxygen Therapy: Patient Spontanous Breathing  Post-op Pain: mild  Post-op Assessment: Post-op Vital signs reviewed, Patient's Cardiovascular Status Stable, Respiratory Function Stable, Patent Airway and No signs of Nausea or vomiting  Last Vitals:  Filed Vitals:   10/29/14 1015  BP: 94/63  Pulse: 73  Temp:   Resp: 11    Post-op Vital Signs: stable   Complications: No apparent anesthesia complications

## 2014-10-29 NOTE — Transfer of Care (Signed)
Immediate Anesthesia Transfer of Care Note  Patient: Robert Blair  Procedure(s) Performed: Procedure(s) (LRB): LAPAROSCOPIC REPAIR RECURRENT RIGHT INGUINAL  (Right) INSERTION OF MESH (Right)  Patient Location: PACU  Anesthesia Type: General  Level of Consciousness: sedated, patient cooperative and responds to stimulation  Airway & Oxygen Therapy: Patient Spontanous Breathing and Patient connected to face mask oxgen  Post-op Assessment: Report given to PACU RN and Post -op Vital signs reviewed and stable  Post vital signs: Reviewed and stable  Complications: No apparent anesthesia complications

## 2014-10-29 NOTE — Interval H&P Note (Signed)
History and Physical Interval Note:  10/29/2014 6:55 AM  Robert Blair  has presented today for surgery, with the diagnosis of recurrent right inguinal hernia  The goals and the various methods of treatment have been discussed with the patient and family. After consideration of risks, benefits and other options for treatment, the patient has consented to  Procedure(s): LAPAROSCOPIC REPAIR RECURRENT RIGHT INGUINAL HERNIA POSSIBLE OPEN (Right) INSERTION OF MESH (Right) as a surgical intervention .  The patient's history has been reviewed, patient examined today, no change in status, stable for surgery.  I have reviewed the patient's chart and labs.  Questions were answered to the patient's satisfaction.     [name]

## 2014-10-29 NOTE — Discharge Instructions (Signed)
[facility] _______[facility] ° °UMBILICAL OR INGUINAL HERNIA REPAIR: POST OP INSTRUCTIONS ° °Always review your discharge instruction sheet given to you by the facility where your surgery was performed. °IF YOU HAVE DISABILITY OR FAMILY LEAVE FORMS, YOU MUST BRING THEM TO THE OFFICE FOR PROCESSING.   °DO NOT GIVE THEM TO YOUR DOCTOR. ° °1. A  prescription for pain medication may be given to you upon discharge.  Take your pain medication as prescribed, if needed.  If narcotic pain medicine is not needed, then you may take acetaminophen (Tylenol) or ibuprofen (Advil) as needed. °2. Take your usually prescribed medications unless otherwise directed. °3. If you need a refill on your pain medication, please contact your pharmacy.  They will contact our office to request authorization. Prescriptions will not be filled after 5 pm or on week-ends. °4. You should follow a light diet the first 24 hours after arrival home, such as soup and crackers, etc.  Be sure to include lots of fluids daily.  Resume your normal diet the day after surgery. °5. Most patients will experience some swelling and bruising around the umbilicus or in the groin and scrotum.  Ice packs and reclining will help.  Swelling and bruising can take several days to resolve.  °6. It is common to experience some constipation if taking pain medication after surgery.  Increasing fluid intake and taking a stool softener (such as Colace) will usually help or prevent this problem from occurring.  A mild laxative (Milk of Magnesia or Miralax) should be taken according to package directions if there are no bowel movements after 48 hours. °7. Unless discharge instructions indicate otherwise, you may remove your bandages 24-48 hours after surgery, and you may shower at that time.  You may have steri-strips (small skin tapes) in place directly over the incision.  These strips should be left on the skin for 7-10 days.  If your surgeon used skin glue on the  incision, you may shower in 24 hours.  The glue will flake off over the next 2-3 weeks.  Any sutures or staples will be removed at the office during your follow-up visit. °8. ACTIVITIES:  You may resume regular (light) daily activities beginning the next day--such as daily self-care, walking, climbing stairs--gradually increasing activities as tolerated.  You may have sexual intercourse when it is comfortable.  Refrain from any heavy lifting or straining until approved by your doctor. °a. You may drive when you are no longer taking prescription pain medication, you can comfortably wear a seatbelt, and you can safely maneuver your car and apply brakes. °b. RETURN TO WORK:  __________________________________________________________ °9. You should see your doctor in the office for a follow-up appointment approximately 2-3 weeks after your surgery.  Make sure that you call for this appointment within a day or two after you arrive home to insure a convenient appointment time. °10. OTHER INSTRUCTIONS:  __________________________________________________________________________________________________________________________________________________________________________________________  °WHEN TO CALL YOUR DOCTOR: °1. Fever over 101.0 °2. Inability to urinate °3. Nausea and/or vomiting °4. Extreme swelling or bruising °5. Continued bleeding from incision. °6. Increased pain, redness, or drainage from the incision ° °The clinic staff is available to answer your questions during regular business hours.  Please don’t hesitate to call and ask to speak to one of the nurses for clinical concerns.  If you have a medical emergency, go to the nearest emergency room or call 911.  A surgeon from [facility] is always on call at the hospital ° ° °  [address] [place], [place], [place]  [address] ?  [address], [place], [place]   [phone] ? [phone] ? FAX [phone] Web site:  [email or web address]                       Inguinal Hernia, Adult  Care After Refer to this sheet in the next few weeks. These discharge instructions provide you with general information on caring for yourself after you leave the hospital. Your caregiver may also give you specific instructions. Your treatment has been planned according to the most current medical practices available, but unavoidable complications sometimes occur. If you have any problems or questions after discharge, please call your caregiver. HOME CARE INSTRUCTIONS  Put ice on the operative site.  Put ice in a plastic bag.  Place a towel between your skin and the bag.  Leave the ice on for 15-20 minutes at a time, 03-04 times a day while awake.  Change bandages (dressings) as directed.  Keep the wound dry and clean. The wound may be washed gently with soap and water. Gently blot or dab the wound dry. It is okay to take showers 24 to 48 hours after surgery. Do not take baths, use swimming pools, or use hot tubs for 10 days, or as directed by your caregiver.  Only take over-the-counter or prescription medicines for pain, discomfort, or fever as directed by your caregiver.  Continue your normal diet as directed.  Do not lift anything more than 10 pounds or play contact sports for 3 weeks, or as directed. SEEK MEDICAL CARE IF:  There is redness, swelling, or increasing pain in the wound.  There is fluid (pus) coming from the wound.  There is drainage from a wound lasting longer than 1 day.  You have an oral temperature above 102 F (38.9 C).  You notice a bad smell coming from the wound or dressing.  The wound breaks open after the stitches (sutures) have been removed.  You notice increasing pain in the shoulders (shoulder strap areas).  You develop dizzy episodes or fainting while standing.  You feel sick to your stomach (nauseous) or throw up (vomit). SEEK IMMEDIATE MEDICAL  CARE IF:  You develop a rash.  You have difficulty breathing.  You develop a reaction or have side effects to medicines you were given. MAKE SURE YOU:   Understand these instructions.  Will watch your condition.  Will get help right away if you are not doing well or get worse. Document Released: 10/01/2006 Document Revised: 11/23/2011 Document Reviewed: 07/31/2009 [facility] Patient Information 2015 [place], [place]. This information is not intended to replace advice given to you by your health care provider. Make sure you discuss any questions you have with your health care provider.

## 2014-10-29 NOTE — Anesthesia Procedure Notes (Signed)
Procedure Name: Intubation Date/Time: 10/29/2014 7:18 AM Performed by: Anne Fu Pre-anesthesia Checklist: Patient identified, Emergency Drugs available, Suction available, Patient being monitored and Timeout performed Patient Re-evaluated:Patient Re-evaluated prior to inductionOxygen Delivery Method: Circle system utilized Preoxygenation: Pre-oxygenation with 100% oxygen Intubation Type: IV induction Ventilation: Mask ventilation without difficulty Laryngoscope Size: Mac and 4 Grade View: Grade II Tube type: Oral Tube size: 7.5 mm Number of attempts: 1 Airway Equipment and Method: Stylet Placement Confirmation: ETT inserted through vocal cords under direct vision,  positive ETCO2,  CO2 detector and breath sounds checked- equal and bilateral Secured at: 21 cm Tube secured with: Tape Dental Injury: Teeth and Oropharynx as per pre-operative assessment

## 2014-10-29 NOTE — Op Note (Signed)
Patient Name:           Robert Blair   Date of Surgery:        10/29/2014  Pre op Diagnosis:      Recurrent right inguinal hernia  Post op Diagnosis:    Recurrent right inguinal hernia  Procedure:                 Laparoscopic, preperitoneal repair of recurrent right inguinal hernia with mesh  Surgeon:                     Edsel Petrin. Dalbert Batman, M.D., FACS  Assistant:                      OR staff  Operative Indications:. This is a very pleasant, 59 year old Caucasian male, referred by Dr. [name] for evaluation and management of a symptomatic, recurrent right inguinal hernia. The patient underwent open repair of right inguinal hernia with mesh, per his report, in the mid 80s. He states that he has known he had a recurrence for about 2 years but it did not bother him. He underwent repair of a right foot fracture in September 2015. He was trying to get back in his SUV and slipped and felt increased pain in his right groin and the bulge was much bigger. He's been wearing a truss and feels much better now. No nausea vomiting or abdominal pain. Exam reveals a reducible medium sized bulge in the right inguinal area that does not extend past the external inguinal ring. No evidence of hernia on the left. Comorbidities include hypertension and hypothyroidism. No history of abdominal surgery.  Operative Findings:       The patient had a moderately large, somewhat incarcerated indirect right inguinal hernia. This was mostly a large bolus of preperitoneal fat that had to be dissected out of the inguinal canal lateral to the cord structures. Separate from this there was a small indirect sac on the anterior surface of the cord structures. but it did not really go up into the internal ring. Medial to this there was some fatty tissue stuck in the direct space, although he really did not have a direct bulge.  There was an increased amount of scar tissue in the area, presumed secondary to his previous  surgery. The anatomy of the symphysis pubis, Cooper's ligaments, posterior rectus sheath, inferior epigastric vessels was fairly traditional.  Procedure in Detail:          Following the induction of general endotracheal anesthesia a Foley catheter was inserted and the bladder emptied. The balloon was left deflated on the Foley. The abdomen and genitalia were prepped and draped in a sterile fashion. Intravenous antibiotics were given. Surgical timeout was performed. 0.5% Marcaine with epinephrine was used as local infiltration anesthetic.      Transverse incision was made at the lower rim of the umbilicus. The fascia was incised transversely. We entered the right rectus sheath. The Spacemaker balloon was inserted in the right rectus sheath in the midline to just above the symphysis pubis. Video camera was inserted and the Spacemaker balloon was inflated with air under direct vision and deployed nicely. This was held in place for 5 minutes. The balloon was removed. The camera trocar balloon was inflated and the camera trocar secured and connected to the insufflator. Insufflation was good and we could see fairly well. A pneumoperitoneum had to be released through a Veress needle in the right subcostal  area. A 5 mm trochar was placed in the midline below the umbilicus. I used this to clean off of peritoneum on the left side and placed another 5 mm trocar on  left side under direct vision. We dissected out Cooper's ligament. We dissected some fatty tissue out of the direct space but there really wasn't much of a direct hernia. We spent a long time cleaning off the lateral abdominal wall and mobilizing the cord structures and the indirect hernia as described above. Once this was done there was nothing remaining but the testicular vessels and the vas deferens which were visualized. I inserted a 3-D Max mesh, large size, light weight. This was inserted and positioned so as to overlap the midline slightly, overlap  Cooper's ligament slightly, and then it deployed laterally quite nicely. This was secured with the 5 mm ProTacker with some tacks on the superior rim of Cooper's ligament, up along the midline, across the posterior belly rectus muscle. Laterally I placed about 3 tacks being sure that I could palpate the tacker through the abdominal wall to be sure that I stayed above the iliopubic tract. This seemed to have very good coverage and positioning and all of the fatty tissue in hernia that been present was back well above well below the lower rim of the mesh.     Field was irrigated little bit but there really wasn't any bleeding. Pneumoperitoneum was released and all the trochars were removed. The fascia at the umbilicus was closed with 2 figure-of-eight sutures of 0 Vicryl. Skin incisions were closed with subcuticular 4-0 Monocryl and Dermabond. The patient tolerated procedure well was taken to PACU in stable condition. EBL 20 mL or less. Counts correct. Complications none.     Edsel Petrin. Dalbert Batman, M.D., FACS General and Minimally Invasive Surgery Breast and Colorectal Surgery  10/29/2014 8:58 AM

## 2014-10-30 ENCOUNTER — Encounter (HOSPITAL_COMMUNITY): Payer: Self-pay | Admitting: Emergency Medicine

## 2014-10-30 ENCOUNTER — Emergency Department (HOSPITAL_COMMUNITY)
Admission: EM | Admit: 2014-10-30 | Discharge: 2014-10-30 | Disposition: A | Discharge status: Home / Self Care | Payer: BLUE CROSS/BLUE SHIELD | Attending: Emergency Medicine | Admitting: Emergency Medicine

## 2014-10-30 DIAGNOSIS — Z86010 Personal history of colonic polyps: Secondary | ICD-10-CM | POA: Insufficient documentation

## 2014-10-30 DIAGNOSIS — I10 Essential (primary) hypertension: Secondary | ICD-10-CM | POA: Insufficient documentation

## 2014-10-30 DIAGNOSIS — Z85828 Personal history of other malignant neoplasm of skin: Secondary | ICD-10-CM | POA: Insufficient documentation

## 2014-10-30 DIAGNOSIS — M7989 Other specified soft tissue disorders: Secondary | ICD-10-CM | POA: Diagnosis not present

## 2014-10-30 DIAGNOSIS — F329 Major depressive disorder, single episode, unspecified: Secondary | ICD-10-CM | POA: Diagnosis not present

## 2014-10-30 DIAGNOSIS — E039 Hypothyroidism, unspecified: Secondary | ICD-10-CM | POA: Diagnosis not present

## 2014-10-30 DIAGNOSIS — R339 Retention of urine, unspecified: Secondary | ICD-10-CM

## 2014-10-30 DIAGNOSIS — Z79899 Other long term (current) drug therapy: Secondary | ICD-10-CM | POA: Diagnosis not present

## 2014-10-30 MED ORDER — TAMSULOSIN HCL 0.4 MG PO CAPS: 0.4000 mg | ORAL_CAPSULE | Freq: Every day | ORAL | Status: DC

## 2014-10-30 NOTE — ED Notes (Signed)
Pt had a Sx for hernia 10/29/2014 and after sx is not able to urinate, 10/10 soreness for not been able to urinate. Denies fever, chills, n/v/d.

## 2014-10-30 NOTE — Discharge Instructions (Signed)
Acute Urinary Retention °Acute urinary retention is the temporary inability to urinate. °This is a common problem in older men. As men age their prostates become larger and block the flow of urine from the bladder. This is usually a problem that has come on gradually.  °HOME CARE INSTRUCTIONS °If you are sent home with a Foley catheter and a drainage system, you will need to discuss the best course of action with your health care provider. While the catheter is in, maintain a good intake of fluids. Keep the drainage bag emptied and lower than your catheter. This is so that contaminated urine will not flow back into your bladder, which could lead to a urinary tract infection. °There are two main types of drainage bags. One is a large bag that usually is used at night. It has a good capacity that will allow you to sleep through the night without having to empty it. The second type is called a leg bag. It has a smaller capacity, so it needs to be emptied more frequently. However, the main advantage is that it can be attached by a leg strap and can go underneath your clothing, allowing you the freedom to move about or leave your home. °Only take over-the-counter or prescription medicines for pain, discomfort, or fever as directed by your health care provider.  °SEEK MEDICAL CARE IF: °· You develop a low-grade fever. °· You experience spasms or leakage of urine with the spasms. °SEEK IMMEDIATE MEDICAL CARE IF:  °· You develop chills or fever. °· Your catheter stops draining urine. °· Your catheter falls out. °· You start to develop increased bleeding that does not respond to rest and increased fluid intake. °MAKE SURE YOU: °· Understand these instructions. °· Will watch your condition. °· Will get help right away if you are not doing well or get worse. °Document Released: 12/07/2000 Document Revised: 09/05/2013 Document Reviewed: 02/09/2013 °[employer]® Patient Information ©2015 [employer]. This information is not  intended to replace advice given to you by your health care provider. Make sure you discuss any questions you have with your health care provider. ° °

## 2014-10-30 NOTE — ED Provider Notes (Signed)
CSN: [id number]     Arrival date & time 10/30/14  0346 History   First MD Initiated Contact with Patient 10/30/14 0405     Chief Complaint  Patient presents with  . Urinary Retention     (Consider location/radiation/quality/duration/timing/severity/associated sxs/prior Treatment) HPI 59 year old male presents to emergency department with complaint of inability to urinate.  Patient had right inguinal hernia repair yesterday.  He reports while in postop, he was unable to urinate.  He reports he had in and out cath prior to leaving.  Since coming home, he has been unable to urinate.  He denies previous problems with urinary retention.  He reports he had an elevated PSA but normal biopsy done in the past.  Patient reports he is doing well as far as his surgery goes.  He reports he has not yet had a bowel movement post surgery Past Medical History  Diagnosis Date  . Unspecified essential hypertension   . Hypothyroidism   . Personal history of colonic polyps     Benign  . Depression   . Cancer     basal cell carcinoma / nose removed 2015    Past Surgical History  Procedure Laterality Date  . Hernia repair      1984   Family History  Problem Relation Age of Onset  . Breast cancer    . Colon polyps    . Colon cancer Neg Hx   . Rectal cancer Neg Hx   . Stomach cancer Neg Hx    History  Substance Use Topics  . Smoking status: Never Smoker   . Smokeless tobacco: Never Used  . Alcohol Use: Yes     Comment: rare    Review of Systems   See History of Present Illness; otherwise all other systems are reviewed and negative  Allergies  Chamomile  Home Medications   Prior to Admission medications   Medication Sig Start Date End Date Taking? Authorizing Provider  amLODipine (NORVASC) 10 MG tablet Take 10 mg by mouth daily.    Historical Provider, MD  buPROPion (WELLBUTRIN XL) 150 MG 24 hr tablet Take 450 mg by mouth daily.    Historical Provider, MD  levothyroxine (SYNTHROID,  LEVOTHROID) 100 MCG tablet Take 100 mcg by mouth daily before breakfast.    Historical Provider, MD  oxyCODONE-acetaminophen (PERCOCET) 7.5-325 MG per tablet Take 1 tablet by mouth every 4 (four) hours as needed for pain. 10/29/14   Fanny Skates, MD  tamsulosin (FLOMAX) 0.4 MG CAPS capsule Take 1 capsule (0.4 mg total) by mouth daily. 10/30/14   Kalman Drape, MD  valsartan-hydrochlorothiazide (DIOVAN-HCT) 320-25 MG per tablet Take 1 tablet by mouth daily.    Historical Provider, MD   BP 132/84 mmHg  Pulse 90  Temp(Src) 98 F (36.7 C) (Oral)  Resp 18  Ht 5\' 8"  (1.727 m)  Wt 203 lb (92.08 kg)  BMI 30.87 kg/m2  SpO2 94% Physical Exam  Constitutional: He is oriented to person, place, and time. He appears well-developed and well-nourished.  HENT:  Head: Normocephalic and atraumatic.  Nose: Nose normal.  Mouth/Throat: Oropharynx is clear and moist.  Eyes: Conjunctivae and EOM are normal. Pupils are equal, round, and reactive to light.  Neck: Normal range of motion. Neck supple. No JVD present. No tracheal deviation present. No thyromegaly present.  Cardiovascular: Normal rate, regular rhythm, normal heart sounds and intact distal pulses.  Exam reveals no gallop and no friction rub.   No murmur heard. Pulmonary/Chest: Effort normal and breath  sounds normal. No stridor. No respiratory distress. He has no wheezes. He has no rales. He exhibits no tenderness.  Abdominal: Soft. Bowel sounds are normal. He exhibits no distension and no mass. There is no tenderness. There is no rebound and no guarding.  Patient is abdomen is soft, he has diffuse tenderness to palpation.  The abdomen is not distended.  His incisions are clean, dry and intact.  He has bruising around the incision sites, and also has ecchymosis to scrotum mainly along the right side.  Musculoskeletal: Normal range of motion. He exhibits no edema or tenderness.  Lymphadenopathy:    He has no cervical adenopathy.  Neurological: He is  alert and oriented to person, place, and time. He displays normal reflexes. He exhibits normal muscle tone. Coordination normal.  Skin: Skin is warm and dry. No rash noted. No erythema. No pallor.  Psychiatric: He has a normal mood and affect. His behavior is normal. Judgment and thought content normal.  Nursing note and vitals reviewed.   ED Course  Procedures (including critical care time) Labs Review Labs Reviewed - No data to display  Imaging Review No results found.   EKG Interpretation None      MDM   Final diagnoses:  Urinary retention   59 yo male with post op urinary retention.  He had in and out cath done in pacu, foley catheter placed here with over 600 cc urine.  Will place on flomax, have him f/u with urology this week.     Kalman Drape, MD 10/30/14 [phone]

## 2015-02-12 ENCOUNTER — Ambulatory Visit (AMBULATORY_SURGERY_CENTER): Payer: Self-pay | Admitting: *Deleted

## 2015-02-12 VITALS — Ht 67.75 in | Wt 210.8 lb

## 2015-02-12 DIAGNOSIS — Z86010 Personal history of colonic polyps: Secondary | ICD-10-CM

## 2015-02-12 MED ORDER — NA SULFATE-K SULFATE-MG SULF 17.5-3.13-1.6 GM/177ML PO SOLN: 1.0000 | Freq: Once | ORAL | Status: DC

## 2015-02-12 NOTE — Progress Notes (Signed)
No egg or soy allergy No home 02 use No diet pills Last procedure had "sleepy bladder" post op, had to have cath after, no other issues  emmi declined

## 2015-02-19 ENCOUNTER — Encounter: Payer: Self-pay | Admitting: Internal Medicine

## 2015-02-25 ENCOUNTER — Ambulatory Visit (AMBULATORY_SURGERY_CENTER): Payer: BLUE CROSS/BLUE SHIELD | Admitting: Internal Medicine

## 2015-02-25 ENCOUNTER — Encounter: Payer: Self-pay | Admitting: Internal Medicine

## 2015-02-25 VITALS — BP 152/106 | HR 70 | Temp 96.4°F | Resp 15 | Ht 67.75 in | Wt 210.0 lb

## 2015-02-25 DIAGNOSIS — D123 Benign neoplasm of transverse colon: Secondary | ICD-10-CM

## 2015-02-25 DIAGNOSIS — Z86010 Personal history of colonic polyps: Secondary | ICD-10-CM | POA: Diagnosis not present

## 2015-02-25 DIAGNOSIS — D122 Benign neoplasm of ascending colon: Secondary | ICD-10-CM

## 2015-02-25 DIAGNOSIS — D124 Benign neoplasm of descending colon: Secondary | ICD-10-CM

## 2015-02-25 MED ORDER — SODIUM CHLORIDE 0.9 % IV SOLN: 500.0000 mL | INTRAVENOUS | Status: DC

## 2015-02-25 NOTE — Progress Notes (Signed)
Report to PACU, RN, vss, BBS= Clear.  

## 2015-02-25 NOTE — Patient Instructions (Signed)
YOU HAD AN ENDOSCOPIC PROCEDURE TODAY AT [facility]:   Refer to the procedure report that was given to you for any specific questions about what was found during the examination.  If the procedure report does not answer your questions, please call your gastroenterologist to clarify.  If you requested that your care partner not be given the details of your procedure findings, then the procedure report has been included in a sealed envelope for you to review at your convenience later.  YOU SHOULD EXPECT: Some feelings of bloating in the abdomen. Passage of more gas than usual.  Walking can help get rid of the air that was put into your GI tract during the procedure and reduce the bloating. If you had a lower endoscopy (such as a colonoscopy or flexible sigmoidoscopy) you may notice spotting of blood in your stool or on the toilet paper. If you underwent a bowel prep for your procedure, you may not have a normal bowel movement for a few days.  Please Note:  You might notice some irritation and congestion in your nose or some drainage.  This is from the oxygen used during your procedure.  There is no need for concern and it should clear up in a day or so.  SYMPTOMS TO REPORT IMMEDIATELY:   Following lower endoscopy (colonoscopy or flexible sigmoidoscopy):  Excessive amounts of blood in the stool  Significant tenderness or worsening of abdominal pains  Swelling of the abdomen that is new, acute  Fever of 100F or higher   For urgent or emergent issues, a gastroenterologist can be reached at any hour by calling [phone].   DIET: Your first meal following the procedure should be a small meal and then it is ok to progress to your normal diet. Heavy or fried foods are harder to digest and may make you feel nauseous or bloated.  Likewise, meals heavy in dairy and vegetables can increase bloating.  Drink plenty of fluids but you should avoid alcoholic beverages for 24  hours.  ACTIVITY:  You should plan to take it easy for the rest of today and you should NOT DRIVE or use heavy machinery until tomorrow (because of the sedation medicines used during the test).    FOLLOW UP: Our staff will call the number listed on your records the next business day following your procedure to check on you and address any questions or concerns that you may have regarding the information given to you following your procedure. If we do not reach you, we will leave a message.  However, if you are feeling well and you are not experiencing any problems, there is no need to return our call.  We will assume that you have returned to your regular daily activities without incident.  If any biopsies were taken you will be contacted by phone or by letter within the next 1-3 weeks.  Please call us at [phone] if you have not heard about the biopsies in 3 weeks.    SIGNATURES/CONFIDENTIALITY: You and/or your care partner have signed paperwork which will be entered into your electronic medical record.  These signatures attest to the fact that that the information above on your After Visit Summary has been reviewed and is understood.  Full responsibility of the confidentiality of this discharge information lies with you and/or your care-partner.  Discharge instructions given Diverticulosis/Polyp handout given Await Pathology results High fiber diet You will receive a letter within 1-2 weeks with results

## 2015-02-25 NOTE — Progress Notes (Signed)
Called to room to assist during endoscopic procedure.  Patient ID and intended procedure confirmed with present staff. Received instructions for my participation in the procedure from the performing physician.  

## 2015-02-25 NOTE — Op Note (Signed)
[place]  [address] [place], [id number]   COLONOSCOPY PROCEDURE REPORT  PATIENT: Robert Blair, Robert Blair  MR#: [id number] BIRTHDATE: 11/08/1955 , 33  yrs. old GENDER: male ENDOSCOPIST: Jerene Bears, MD PROCEDURE DATE:  02/25/2015 PROCEDURE:   Colonoscopy, surveillance and Colonoscopy with snare polypectomy First Screening Colonoscopy - Avg.  risk and is 50 yrs.  old or older - No.  Prior Negative Screening - Now for repeat screening. N/A  History of Adenoma - Now for follow-up colonoscopy & has been > or = to 3 yrs.  Yes hx of adenoma.  Has been 3 or more years since last colonoscopy.  Polyps removed today? Yes ASA CLASS:   Class II INDICATIONS:Surveillance due to prior colonic neoplasia and PH Colon Adenoma, last colonoscopy 3 yrs ago. MEDICATIONS: Monitored anesthesia care, Propofol 230 mg IV, and Lidocaine 150 mg IV  DESCRIPTION OF PROCEDURE:   After the risks benefits and alternatives of the procedure were thoroughly explained, informed consent was obtained.  The digital rectal exam revealed no rectal mass.   The LB Olympus Loaner [id number]  endoscope was introduced through the anus and advanced to the cecum, which was identified by both the appendix and ileocecal valve. No adverse events experienced.   The quality of the prep was (Suprep was used) good. The instrument was then slowly withdrawn as the colon was fully examined. Estimated blood loss is zero unless otherwise noted in this procedure report.   COLON FINDINGS: Melanosis coli was found throughout the entire examined colon.   Three sessile polyps ranging from 4 to 11mm in size were found in the ascending colon, transverse colon, and descending colon.  Polypectomies were performed with a cold snare. The resection was complete, the polyp tissue was completely retrieved and sent to histology.   There was moderate diverticulosis noted in the descending colon and sigmoid colon. Retroflexed views revealed  internal hemorrhoids. The time to cecum = 2.7 Withdrawal time = 14.1   The scope was withdrawn and the procedure completed. COMPLICATIONS: There were no immediate complications.  ENDOSCOPIC IMPRESSION: 1.   Melanosis coli was found throughout the entire examined colon 2.   Three sessile polyps ranging from 4 to 37mm in size were found in the ascending colon, transverse colon, and descending colon; polypectomies were performed with a cold snare 3.   Moderate diverticulosis was noted in the descending colon and sigmoid colon  RECOMMENDATIONS: 1.  Await pathology results 2.  High fiber diet 3.  Timing of repeat colonoscopy will be determined by pathology findings. 4.  You will receive a letter within 1-2 weeks with the results of your biopsy as well as final recommendations.  Please call my office if you have not received a letter after 3 weeks.  eSigned:  Jerene Bears, MD 02/25/2015 9:17 AM   cc: [name], MD and The Patient

## 2015-02-26 ENCOUNTER — Telehealth: Payer: Self-pay | Admitting: *Deleted

## 2015-02-26 NOTE — Telephone Encounter (Signed)
  Follow up Call-  Call back number 02/25/2015  Post procedure Call Back phone  # [phone]  Permission to leave phone message Yes     Patient questions:  Do you have a fever, pain , or abdominal swelling? No. Pain Score  0 *  Have you tolerated food without any problems? Yes.    Have you been able to return to your normal activities? Yes.    Do you have any questions about your discharge instructions: Diet   No. Medications  No. Follow up visit  No.  Do you have questions or concerns about your Care? No.  Actions: * If pain score is 4 or above: No action needed, pain <4.

## 2015-03-04 ENCOUNTER — Encounter: Payer: Self-pay | Admitting: Internal Medicine

## 2015-06-28 ENCOUNTER — Other Ambulatory Visit: Payer: Self-pay | Admitting: General Surgery

## 2015-06-28 DIAGNOSIS — N433 Hydrocele, unspecified: Secondary | ICD-10-CM

## 2015-07-02 ENCOUNTER — Encounter (INDEPENDENT_AMBULATORY_CARE_PROVIDER_SITE_OTHER): Payer: Self-pay

## 2015-07-02 ENCOUNTER — Ambulatory Visit
Admission: RE | Admit: 2015-07-02 | Discharge: 2015-07-02 | Disposition: A | Discharge status: Home / Self Care | Payer: BLUE CROSS/BLUE SHIELD | Source: Ambulatory Visit | Attending: General Surgery | Admitting: General Surgery

## 2015-07-02 DIAGNOSIS — N433 Hydrocele, unspecified: Secondary | ICD-10-CM

## 2015-08-12 ENCOUNTER — Other Ambulatory Visit: Payer: Self-pay | Admitting: Urology

## 2015-08-20 ENCOUNTER — Encounter (HOSPITAL_BASED_OUTPATIENT_CLINIC_OR_DEPARTMENT_OTHER): Payer: Self-pay | Admitting: *Deleted

## 2015-08-20 NOTE — Progress Notes (Signed)
NPO AFTER MN.  ARRIVE AT 0800.  NEEDS ISTAT AND EKG.  WILL TAKE AM MEDS W/ EXCEPTION NO DIOVAN/HCT W/ SIPS OF WATER.

## 2015-08-25 ENCOUNTER — Encounter (HOSPITAL_BASED_OUTPATIENT_CLINIC_OR_DEPARTMENT_OTHER): Payer: Self-pay | Admitting: Anesthesiology

## 2015-08-25 NOTE — Anesthesia Preprocedure Evaluation (Addendum)
Anesthesia Evaluation  Patient identified by MRN, date of birth, ID band Patient awake    Reviewed: Allergy & Precautions, NPO status , Patient's Chart, lab work & pertinent test results  History of Anesthesia Complications (+) history of anesthetic complications  Airway Mallampati: II  TM Distance: >3 FB Neck ROM: Full    Dental no notable dental hx. (+) Teeth Intact, Dental Advisory Given   Pulmonary neg pulmonary ROS,    Pulmonary exam normal breath sounds clear to auscultation       Cardiovascular hypertension, Pt. on medications Normal cardiovascular exam Rhythm:Regular Rate:Normal     Neuro/Psych PSYCHIATRIC DISORDERS Depression negative neurological ROS     GI/Hepatic negative GI ROS, Neg liver ROS,   Endo/Other  Hypothyroidism   Renal/GU negative Renal ROS  negative genitourinary   Musculoskeletal negative musculoskeletal ROS (+)   Abdominal (+) + obese,   Peds negative pediatric ROS (+)  Hematology negative hematology ROS (+)   Anesthesia Other Findings   Reproductive/Obstetrics negative OB ROS                            Anesthesia Physical Anesthesia Plan  ASA: II  Anesthesia Plan: General   Post-op Pain Management:    Induction: Intravenous  Airway Management Planned: LMA  Additional Equipment:   Intra-op Plan:   Post-operative Plan: Extubation in OR  Informed Consent: I have reviewed the patients History and Physical, chart, labs and discussed the procedure including the risks, benefits and alternatives for the proposed anesthesia with the patient or authorized representative who has indicated his/her understanding and acceptance.   Dental advisory given  Plan Discussed with: CRNA  Anesthesia Plan Comments:         Anesthesia Quick Evaluation

## 2015-08-26 ENCOUNTER — Ambulatory Visit (HOSPITAL_BASED_OUTPATIENT_CLINIC_OR_DEPARTMENT_OTHER)
Admission: RE | Admit: 2015-08-26 | Discharge: 2015-08-26 | Disposition: A | Discharge status: Home / Self Care | Payer: 59 | Source: Ambulatory Visit | Attending: Urology | Admitting: Urology

## 2015-08-26 ENCOUNTER — Ambulatory Visit (HOSPITAL_BASED_OUTPATIENT_CLINIC_OR_DEPARTMENT_OTHER): Payer: 59 | Admitting: Anesthesiology

## 2015-08-26 ENCOUNTER — Other Ambulatory Visit: Payer: Self-pay

## 2015-08-26 ENCOUNTER — Encounter (HOSPITAL_BASED_OUTPATIENT_CLINIC_OR_DEPARTMENT_OTHER)
Admission: RE | Disposition: A | Discharge status: Home / Self Care | Payer: Self-pay | Source: Ambulatory Visit | Attending: Urology

## 2015-08-26 ENCOUNTER — Encounter (HOSPITAL_BASED_OUTPATIENT_CLINIC_OR_DEPARTMENT_OTHER): Payer: Self-pay | Admitting: Anesthesiology

## 2015-08-26 DIAGNOSIS — Z6833 Body mass index (BMI) 33.0-33.9, adult: Secondary | ICD-10-CM | POA: Insufficient documentation

## 2015-08-26 DIAGNOSIS — N433 Hydrocele, unspecified: Secondary | ICD-10-CM | POA: Diagnosis not present

## 2015-08-26 DIAGNOSIS — N5089 Other specified disorders of the male genital organs: Secondary | ICD-10-CM | POA: Diagnosis present

## 2015-08-26 DIAGNOSIS — I10 Essential (primary) hypertension: Secondary | ICD-10-CM | POA: Diagnosis not present

## 2015-08-26 DIAGNOSIS — E669 Obesity, unspecified: Secondary | ICD-10-CM | POA: Diagnosis not present

## 2015-08-26 DIAGNOSIS — E039 Hypothyroidism, unspecified: Secondary | ICD-10-CM | POA: Diagnosis not present

## 2015-08-26 HISTORY — DX: Other specified postprocedural states: Z98.890

## 2015-08-26 HISTORY — DX: Personal history of adenomatous and serrated colon polyps: Z86.0101

## 2015-08-26 HISTORY — DX: Personal history of other specified conditions: Z87.898

## 2015-08-26 HISTORY — DX: Personal history of colonic polyps: Z86.010

## 2015-08-26 HISTORY — DX: Other complications of anesthesia, initial encounter: T88.59XA

## 2015-08-26 HISTORY — PX: HYDROCELE EXCISION: SHX482

## 2015-08-26 HISTORY — DX: Other specified postprocedural states: Z85.828

## 2015-08-26 HISTORY — DX: Adverse effect of unspecified anesthetic, initial encounter: T41.45XA

## 2015-08-26 HISTORY — DX: Other seasonal allergic rhinitis: J30.2

## 2015-08-26 LAB — POCT I-STAT 4, (NA,K, GLUC, HGB,HCT)
Glucose, Bld: 96 mg/dL (ref 65–99)
HEMATOCRIT: 50 % (ref 39.0–52.0)
HEMOGLOBIN: 17 g/dL (ref 13.0–17.0)
Potassium: 3.5 mmol/L (ref 3.5–5.1)
SODIUM: 139 mmol/L (ref 135–145)

## 2015-08-26 SURGERY — HYDROCELECTOMY
Anesthesia: General | Laterality: Right

## 2015-08-26 MED ORDER — MIDAZOLAM HCL 5 MG/5ML IJ SOLN
INTRAMUSCULAR | Status: DC | PRN
  Administered 2015-08-26: 2 mg via INTRAVENOUS

## 2015-08-26 MED ORDER — DEXAMETHASONE SODIUM PHOSPHATE 10 MG/ML IJ SOLN
INTRAMUSCULAR | Status: AC
  Filled 2015-08-26: qty 1

## 2015-08-26 MED ORDER — DEXAMETHASONE SODIUM PHOSPHATE 4 MG/ML IJ SOLN
INTRAMUSCULAR | Status: DC | PRN
  Administered 2015-08-26: 10 mg via INTRAVENOUS

## 2015-08-26 MED ORDER — EPHEDRINE SULFATE 50 MG/ML IJ SOLN
INTRAMUSCULAR | Status: AC
  Filled 2015-08-26: qty 1

## 2015-08-26 MED ORDER — PROMETHAZINE HCL 25 MG/ML IJ SOLN
6.2500 mg | INTRAMUSCULAR | Status: DC | PRN
  Filled 2015-08-26: qty 1

## 2015-08-26 MED ORDER — LACTATED RINGERS IV SOLN
INTRAVENOUS | Status: DC
  Administered 2015-08-26 (×3): via INTRAVENOUS
  Filled 2015-08-26: qty 1000

## 2015-08-26 MED ORDER — BUPIVACAINE HCL (PF) 0.25 % IJ SOLN
INTRAMUSCULAR | Status: DC | PRN
  Administered 2015-08-26: 14 mL

## 2015-08-26 MED ORDER — KETOROLAC TROMETHAMINE 30 MG/ML IJ SOLN
INTRAMUSCULAR | Status: DC | PRN
  Administered 2015-08-26: 30 mg via INTRAVENOUS

## 2015-08-26 MED ORDER — ONDANSETRON HCL 4 MG/2ML IJ SOLN
INTRAMUSCULAR | Status: DC | PRN
  Administered 2015-08-26: 4 mg via INTRAVENOUS

## 2015-08-26 MED ORDER — FENTANYL CITRATE (PF) 100 MCG/2ML IJ SOLN
25.0000 ug | INTRAMUSCULAR | Status: DC | PRN
  Filled 2015-08-26: qty 1

## 2015-08-26 MED ORDER — MIDAZOLAM HCL 2 MG/2ML IJ SOLN
INTRAMUSCULAR | Status: AC
  Filled 2015-08-26: qty 2

## 2015-08-26 MED ORDER — EPHEDRINE SULFATE 50 MG/ML IJ SOLN
INTRAMUSCULAR | Status: AC
  Filled 2015-08-26: qty 3

## 2015-08-26 MED ORDER — HYDROCODONE-ACETAMINOPHEN 5-325 MG PO TABS: 1.0000 | ORAL_TABLET | ORAL | Status: DC | PRN

## 2015-08-26 MED ORDER — FENTANYL CITRATE (PF) 100 MCG/2ML IJ SOLN
INTRAMUSCULAR | Status: AC
  Filled 2015-08-26: qty 4

## 2015-08-26 MED ORDER — PROPOFOL 10 MG/ML IV BOLUS
INTRAVENOUS | Status: DC | PRN
  Administered 2015-08-26: 200 mg via INTRAVENOUS
  Administered 2015-08-26: 80 mg via INTRAVENOUS

## 2015-08-26 MED ORDER — FENTANYL CITRATE (PF) 100 MCG/2ML IJ SOLN
INTRAMUSCULAR | Status: DC | PRN
  Administered 2015-08-26 (×2): 50 ug via INTRAVENOUS

## 2015-08-26 MED ORDER — CEFAZOLIN SODIUM-DEXTROSE 2-3 GM-% IV SOLR
INTRAVENOUS | Status: AC
  Filled 2015-08-26: qty 50

## 2015-08-26 MED ORDER — 0.9 % SODIUM CHLORIDE (POUR BTL) OPTIME
TOPICAL | Status: DC | PRN
  Administered 2015-08-26: 500 mL

## 2015-08-26 MED ORDER — PROPOFOL 10 MG/ML IV BOLUS
INTRAVENOUS | Status: AC
  Filled 2015-08-26: qty 40

## 2015-08-26 MED ORDER — CEFAZOLIN SODIUM-DEXTROSE 2-3 GM-% IV SOLR
2.0000 g | INTRAVENOUS | Status: AC
  Administered 2015-08-26: 2 g via INTRAVENOUS
  Filled 2015-08-26: qty 50

## 2015-08-26 MED ORDER — CEFAZOLIN SODIUM 1-5 GM-% IV SOLN
1.0000 g | INTRAVENOUS | Status: DC
  Filled 2015-08-26: qty 50

## 2015-08-26 MED ORDER — LIDOCAINE HCL (CARDIAC) 20 MG/ML IV SOLN
INTRAVENOUS | Status: DC | PRN
  Administered 2015-08-26: 80 mg via INTRAVENOUS

## 2015-08-26 MED ORDER — ONDANSETRON HCL 4 MG/2ML IJ SOLN
INTRAMUSCULAR | Status: AC
  Filled 2015-08-26: qty 2

## 2015-08-26 MED ORDER — LIDOCAINE HCL (CARDIAC) 20 MG/ML IV SOLN
INTRAVENOUS | Status: AC
  Filled 2015-08-26: qty 10

## 2015-08-26 MED ORDER — KETOROLAC TROMETHAMINE 30 MG/ML IJ SOLN
INTRAMUSCULAR | Status: AC
  Filled 2015-08-26: qty 1

## 2015-08-26 SURGICAL SUPPLY — 40 items
BLADE CLIPPER SURG (BLADE) ×3 IMPLANT
BLADE SURG 15 STRL LF DISP TIS (BLADE) ×1 IMPLANT
BLADE SURG 15 STRL SS (BLADE) ×2
BNDG GAUZE ELAST 4 BULKY (GAUZE/BANDAGES/DRESSINGS) ×3 IMPLANT
CANISTER SUCTION 1200CC (MISCELLANEOUS) IMPLANT
CANISTER SUCTION 2500CC (MISCELLANEOUS) ×3 IMPLANT
CLEANER CAUTERY TIP 5X5 PAD (MISCELLANEOUS) ×1 IMPLANT
CLOTH BEACON ORANGE TIMEOUT ST (SAFETY) ×3 IMPLANT
COVER BACK TABLE 60X90IN (DRAPES) ×3 IMPLANT
COVER MAYO STAND STRL (DRAPES) ×3 IMPLANT
DISSECTOR ROUND CHERRY 3/8 STR (MISCELLANEOUS) IMPLANT
DRAIN PENROSE 18X1/4 LTX STRL (WOUND CARE) ×3 IMPLANT
DRAPE PED LAPAROTOMY (DRAPES) ×3 IMPLANT
ELECT REM PT RETURN 9FT ADLT (ELECTROSURGICAL) ×3
ELECTRODE REM PT RTRN 9FT ADLT (ELECTROSURGICAL) ×1 IMPLANT
GLOVE BIO SURGEON STRL SZ8 (GLOVE) ×3 IMPLANT
GOWN STRL REUS W/ TWL LRG LVL3 (GOWN DISPOSABLE) ×1 IMPLANT
GOWN STRL REUS W/ TWL XL LVL3 (GOWN DISPOSABLE) ×1 IMPLANT
GOWN STRL REUS W/TWL LRG LVL3 (GOWN DISPOSABLE) ×2
GOWN STRL REUS W/TWL XL LVL3 (GOWN DISPOSABLE) ×2
KIT ROOM TURNOVER WOR (KITS) ×3 IMPLANT
NEEDLE HYPO 22GX1.5 SAFETY (NEEDLE) ×3 IMPLANT
NS IRRIG 500ML POUR BTL (IV SOLUTION) ×3 IMPLANT
PACK BASIN DAY SURGERY FS (CUSTOM PROCEDURE TRAY) ×3 IMPLANT
PAD CLEANER CAUTERY TIP 5X5 (MISCELLANEOUS) ×2
PENCIL BUTTON HOLSTER BLD 10FT (ELECTRODE) ×3 IMPLANT
SUPPORT SCROTAL LG STRP (MISCELLANEOUS) ×2 IMPLANT
SUPPORTER ATHLETIC LG (MISCELLANEOUS) ×1
SUT CHROMIC 2 0 SH (SUTURE) ×3 IMPLANT
SUT CHROMIC 3 0 SH 27 (SUTURE) ×3 IMPLANT
SUT CHROMIC 4 0 SH 27 (SUTURE) IMPLANT
SUT MNCRL AB 4-0 PS2 18 (SUTURE) ×3 IMPLANT
SYRINGE CONTROL L 12CC (SYRINGE) ×3 IMPLANT
TOWEL NATURAL 6PK STERILE (DISPOSABLE) ×6 IMPLANT
TOWEL OR 17X24 6PK STRL BLUE (TOWEL DISPOSABLE) IMPLANT
TRAY DSU PREP LF (CUSTOM PROCEDURE TRAY) ×3 IMPLANT
TUBE CONNECTING 12'X1/4 (SUCTIONS) ×1
TUBE CONNECTING 12X1/4 (SUCTIONS) ×2 IMPLANT
WATER STERILE IRR 500ML POUR (IV SOLUTION) IMPLANT
YANKAUER SUCT BULB TIP NO VENT (SUCTIONS) ×3 IMPLANT

## 2015-08-26 NOTE — H&P (Signed)
Urology History and Physical Exam  CC: Right scrotal swelling  HPI: 59 year old male presents for right hydrocelectomy. His hydrocele has been present since an elective right inguinal hernia repair in Feb. The pt has had this aspirated twice since then--it reaccumulates rapidly.  PMH: Past Medical History  Diagnosis Date  . Unspecified essential hypertension   . Hypothyroidism   . Depression   . Seasonal allergies   . History of basal cell carcinoma excision     2015  nose  . History of urinary retention     10-30-2014  POST OP  . History of adenomatous polyp of colon     2008/  2013/  2016  . Complication of anesthesia     URINARY RETENTION    PSH: Past Surgical History  Procedure Laterality Date  . Inguinal hernia repair Right 10/29/2014    Procedure: LAPAROSCOPIC REPAIR RECURRENT RIGHT INGUINAL ;  Surgeon: Fanny Skates, MD;  Location: [facility];  Service: General;  Laterality: Right;  . Insertion of mesh Right 10/29/2014    Procedure: INSERTION OF MESH;  Surgeon: Fanny Skates, MD;  Location: [facility];  Service: General;  Laterality: Right;  . Colonoscopy  last one 02-25-2015  . Inguinal hernia repair Right 1984  . Toe osteotomy Bilateral right 2002/  left 2013    great toe    Allergies: Allergies  Allergen Reactions  . Chamomile Other (See Comments)    Eye irritation/ red    Medications: No prescriptions prior to admission     Social History: Social History   Social History  . Marital Status: Single    Spouse Name: N/A  . Number of Children: N/A  . Years of Education: N/A   Occupational History  . self employed    Social History Main Topics  . Smoking status: Never Smoker   . Smokeless tobacco: Never Used  . Alcohol Use: Yes     Comment: rare  . Drug Use: No  . Sexual Activity: Not on file   Other Topics Concern  . Not on file   Social History Narrative    Family History: Family History  Problem Relation Age of Onset  . Colon cancer Neg  Hx   . Rectal cancer Neg Hx   . Stomach cancer Neg Hx   . Breast cancer Mother   . Colon polyps Father   . Stroke Father   . Alzheimer's disease Father   . Hypertension Mother   . Depression Mother   . Cancer - Other Mother     unknown GI cancer    Review of Systems: Positive: Right scrotal swelling Negative:   A further 10 point review of systems was negative except what is listed in the HPI.                  Physical Exam: @VITALS2 @ General: No acute distress.  Awake. Head:  Normocephalic.  Atraumatic. ENT:  EOMI.  Mucous membranes moist Neck:  Supple.  No lymphadenopathy. CV:  S1 present. S2 present. Regular rate. Pulmonary: Equal effort bilaterally.  Clear to auscultation bilaterally. Abdomen: Soft.  Nontender to palpation. Skin:  Normal turgor.  No visible rash. Extremity: No gross deformity of bilateral upper extremities.  No gross deformity of                             lower extremities. Neurologic: Alert. Appropriate mood.  Penis:  Circumcised.  No lesions. Urethra:  Orthotopic meatus. Scrotum: No lesions.  No ecchymosis.  No erythema.Right hydrocele Testicles: Descended bilaterally.  No masses bilaterally. Epididymis: Palpable bilaterally. Nontender to palpation.  Studies:  No results for input(s): HGB, WBC, PLT in the last 72 hours.  No results for input(s): NA, K, CL, CO2, BUN, CREATININE, CALCIUM, GFRNONAA, GFRAA in the last 72 hours.  Invalid input(s): MAGNESIUM   No results for input(s): INR, APTT in the last 72 hours.  Invalid input(s): PT   Invalid input(s): ABG    Assessment:  Recurrent right hydrocele  Plan: Right hydrocelectomy

## 2015-08-26 NOTE — Transfer of Care (Signed)
Immediate Anesthesia Transfer of Care Note  Patient: Robert Blair  Procedure(s) Performed: Procedure(s): RIGHT HYDROCELECTOMY ADULT (Right)  Patient Location: PACU  Anesthesia Type:General  Level of Consciousness: awake, alert , oriented and patient cooperative  Airway & Oxygen Therapy: Patient Spontanous Breathing and Patient connected to nasal cannula oxygen  Post-op Assessment: Report given to RN and Post -op Vital signs reviewed and stable  Post vital signs: Reviewed and stable  Last Vitals:  Filed Vitals:   08/26/15 0758  BP: 149/100  Pulse: 71  Temp: 36.4 C  Resp: 18    Complications: No apparent anesthesia complications

## 2015-08-26 NOTE — Anesthesia Postprocedure Evaluation (Signed)
Anesthesia Post Note  Patient: Robert Blair  Procedure(s) Performed: Procedure(s) (LRB): RIGHT HYDROCELECTOMY ADULT (Right)  Patient location during evaluation: PACU Anesthesia Type: General Level of consciousness: awake and alert Pain management: pain level controlled Vital Signs Assessment: post-procedure vital signs reviewed and stable Respiratory status: spontaneous breathing, nonlabored ventilation, respiratory function stable and patient connected to nasal cannula oxygen Cardiovascular status: blood pressure returned to baseline and stable Postop Assessment: no signs of nausea or vomiting Anesthetic complications: no    Last Vitals:  Filed Vitals:   08/26/15 1100 08/26/15 1140  BP: 135/93 141/96  Pulse: 79 78  Temp:  36.5 C  Resp: 18 18    Last Pain: There were no vitals filed for this visit.               Blakely Maranan J

## 2015-08-26 NOTE — Discharge Instructions (Signed)
Scrotal surgery postoperative instructions  Wound:  In most cases your incision will have absorbable sutures that will dissolve within the first 2-3 weeks. Some will fall out even earlier. Expect some redness as the sutures dissolve but this should occur only around the sutures. If there is generalized redness, especially with increasing pain or swelling, let us know. The scrotum will very likely get "black and blue" as the blood in the tissues spread. Sometimes the whole scrotum will turn colors. The black and blue is followed by a yellow and brown color. In time, all the discoloration will go away. In some cases some firm swelling in the area of the testicle may persist for up to 4-6 weeks after the surgery and is considered normal in most cases.  Drain:  If the surgeon placed a drain through the bottom part of your scrotum, it is held in with a small stitch. When instructed, cut the small stitch and slide to drain out (next Monday). Once the drain has been removed, a small hole made drain out for another day or 2. If so, keep a clean washcloth underneath your supportive undergarment, or sterile gauze. Until the hole seals up, all bathing should be in the shower, and not in the bathtub.  Diet:  You may return to your normal diet within 24 hours following your surgery. You may note some mild nausea and possibly vomiting the first 6-8 hours following surgery. This is usually due to the side effects of anesthesia, and will disappear quite soon. I would suggest clear liquids and a very light meal the first evening following your surgery.  Activity:  Your physical activity should be restricted the first 48 hours. During that time you should remain relatively inactive, moving about only when necessary. During the first 7-10 days following surgery you should avoid lifting any heavy objects (anything greater than 15 pounds), and avoid strenuous exercise. If you work, ask [place] specifically about your  restrictions, both for work and home. We will write a note to your employer if needed.  You should plan to wear a tight pair of jockey shorts or an athletic supporter for the first 4-5 days, even to sleep. This will keep the scrotum immobilized to some degree and keep the swelling down. You may find it more comfortable to wear a support longer.  Ice packs should be placed on and off over the scrotum for the first 48 hours. Frozen peas or corn in a ZipLock bag can be frozen, used and re-frozen. Fifteen minutes on and 15 minutes off is a reasonable schedule. The ice is a good pain reliever and keeps the swelling down.  Hygiene:  You may shower 48 hours after your surgery. Tub bathing should be restricted until the seventh day.   Medication:  You will be sent home with some type of pain medication. In many cases you will be sent home with a narcotic pain pill (Vicodin or Tylox). If the pain is not too bad, you may take either Tylenol (acetaminophen) or Advil (ibuprofen) which contain no narcotic agents, and might be tolerated a little better, with fewer side effects. If the pain medication you are sent home with does not control the pain, you will have to let us know. Some narcotic pain medications cannot be given or refilled by a phone call to a pharmacy.  Problems you should report to [place]:   Fever of 101.0 degrees Fahrenheit or greater.  Moderate or severe swelling under the skin incision or  involving the scrotum.  Drug reaction such as hives, a rash, nausea or vomiting.    Post Anesthesia Home Care Instructions  Activity: Get plenty of rest for the remainder of the day. A responsible adult should stay with you for 24 hours following the procedure.  For the next 24 hours, DO NOT: -Drive a car -Paediatric nurse -Drink alcoholic beverages -Take any medication unless instructed by your physician -Make any legal decisions or sign important papers.  Meals: Start with liquid foods such  as gelatin or soup. Progress to regular foods as tolerated. Avoid greasy, spicy, heavy foods. If nausea and/or vomiting occur, drink only clear liquids until the nausea and/or vomiting subsides. Call your physician if vomiting continues.  Special Instructions/Symptoms: Your throat may feel dry or sore from the anesthesia or the breathing tube placed in your throat during surgery. If this causes discomfort, gargle with warm salt water. The discomfort should disappear within 24 hours.  If you had a scopolamine patch placed behind your ear for the management of post- operative nausea and/or vomiting:  1. The medication in the patch is effective for 72 hours, after which it should be removed.  Wrap patch in a tissue and discard in the trash. Wash hands thoroughly with soap and water. 2. You may remove the patch earlier than 72 hours if you experience unpleasant side effects which may include dry mouth, dizziness or visual disturbances. 3. Avoid touching the patch. Wash your hands with soap and water after contact with the patch.

## 2015-08-26 NOTE — Anesthesia Procedure Notes (Signed)
Procedure Name: LMA Insertion Date/Time: 08/26/2015 9:43 AM Performed by: Wanita Chamberlain Pre-anesthesia Checklist: Patient identified, Timeout performed, Emergency Drugs available, Suction available and Patient being monitored Patient Re-evaluated:Patient Re-evaluated prior to inductionOxygen Delivery Method: Circle system utilized Preoxygenation: Pre-oxygenation with 100% oxygen Intubation Type: IV induction Ventilation: Mask ventilation without difficulty LMA: LMA inserted LMA Size: 4.0 Number of attempts: 1 Airway Equipment and Method: Bite block Placement Confirmation: positive ETCO2 and breath sounds checked- equal and bilateral Tube secured with: Tape Dental Injury: Teeth and Oropharynx as per pre-operative assessment

## 2015-08-26 NOTE — Op Note (Signed)
Preoperative diagnosis: Right hydrocele   Postoperative diagnosis: Same   Procedure:Right hydrocele repair   Surgeon: Lillette Boxer. Jeananne Bedwell, M.D.   Anesthesia: Gen.   Indications: Patient presented with scrotal swelling. A hydrocele was confirmed with scrotal ultrasonography. The patient was symptomatic from his hydrocele and requested surgical intervention. He appeared to understand the risks benefits potential complications of this procedure.   Procedure: The patient was properly identified and marked in the holding room. He received preoperative IV antibiotics. He was then taken to the operating room where general anesthetic was administered using the LMA. The scrotum was then prepped and draped in the usual manner. Appropriate surgical timeout was performed. An incision was made in the median raphae of the scrotum. The hydrocele was encountered which was freed from the scrotal wall and then the testis and hydrocele sac were delivered from the incision. The hydrocele sac was then incised anteriorly and a large amount of fluid was obtained. The testis was carefully inspected and no other pathology was appreciated. The hydrocele sac was moderate in thickness.  The sac was then plicated posteriorly with a running 3-0 chromic suture. The spermatic cord block was then performed with quarter percent Marcaine. The testis was returned to the hemiscrotum taking great care to make sure that there was no twisting of the spermatic cord. Inspection of the inside of the scrotum revealed no significant bleeding. A quarter-inch Penrose was brought through the lower aspect of the scrotum into the affected hemiscrotum and sutured to the skin. The scrotal incision was then closed anatomically, first with a running 3-0 chromic reapproximating the dartos fascia, and then a 4-0 Monocryl used to close the skin in a simple running fashion. A fluff dressing and jockstrap was then placed Sponge and needle counts were correct.  No obvious complications occurred and the patient was brought to recovery room in stable condition.

## 2015-08-27 ENCOUNTER — Encounter (HOSPITAL_BASED_OUTPATIENT_CLINIC_OR_DEPARTMENT_OTHER): Payer: Self-pay | Admitting: Urology

## 2016-11-19 ENCOUNTER — Ambulatory Visit (HOSPITAL_COMMUNITY)
Admission: EM | Admit: 2016-11-19 | Discharge: 2016-11-19 | Disposition: A | Discharge status: Home / Self Care | Payer: 59 | Attending: Internal Medicine | Admitting: Internal Medicine

## 2016-11-19 ENCOUNTER — Encounter (HOSPITAL_COMMUNITY): Payer: Self-pay | Admitting: Emergency Medicine

## 2016-11-19 DIAGNOSIS — J019 Acute sinusitis, unspecified: Secondary | ICD-10-CM

## 2016-11-19 MED ORDER — ONDANSETRON 4 MG PO TBDP: 4.0000 mg | ORAL_TABLET | Freq: Three times a day (TID) | ORAL | 0 refills | Status: DC | PRN

## 2016-11-19 MED ORDER — PREDNISONE 50 MG PO TABS: ORAL_TABLET | ORAL | 0 refills | Status: DC

## 2016-11-19 MED ORDER — AMOXICILLIN-POT CLAVULANATE 875-125 MG PO TABS: 1.0000 | ORAL_TABLET | Freq: Two times a day (BID) | ORAL | 0 refills | Status: DC

## 2016-11-19 NOTE — ED Provider Notes (Signed)
CSN: [id number]     Arrival date & time 11/19/16  1307 History   First MD Initiated Contact with Patient 11/19/16 1347     Chief Complaint  Patient presents with  . URI   (Consider location/radiation/quality/duration/timing/severity/associated sxs/prior Treatment) 61 year old male presents to clinic with a 2 week history of headache, sinus pain, and congestion. States his his symptoms initially started 2 weeks ago as being URI like, however they've continued on without relief. He is been taking over-the-counter Allegra-D, along with Excedrin headache, with some relief. He denies any cough, he denies any pressure in his ears, difficulty hearing, he denies any dental pain, he denies nausea, vomiting, diarrhea, abdominal pain, he denies any chest pain, shortness of breath, wheezing, he denies any swelling to his hands, feet, or ankles, he does state he has been feeling fatigued, and "tired and worn out". His blood pressure is elevated today, states he has been taking decongestions, and caffeine containing analgesics. He does not smoke, past family history significant for CVA, and hypertension, and for cancers.   The history is provided by the patient.  URI    Past Medical History:  Diagnosis Date  . Complication of anesthesia    URINARY RETENTION  . Depression   . History of adenomatous polyp of colon    2008/  2013/  2016  . History of basal cell carcinoma excision    2015  nose  . History of urinary retention    10-30-2014  POST OP  . Hypothyroidism   . Seasonal allergies   . Unspecified essential hypertension    Past Surgical History:  Procedure Laterality Date  . COLONOSCOPY  last one 02-25-2015  . HYDROCELE EXCISION Right 08/26/2015   Procedure: RIGHT HYDROCELECTOMY ADULT;  Surgeon: Franchot Gallo, MD;  Location: [facility];  Service: Urology;  Laterality: Right;  . INGUINAL HERNIA REPAIR Right 10/29/2014   Procedure: LAPAROSCOPIC REPAIR RECURRENT RIGHT INGUINAL  ;  Surgeon: Fanny Skates, MD;  Location: [facility];  Service: General;  Laterality: Right;  . INGUINAL HERNIA REPAIR Right 1984  . INSERTION OF MESH Right 10/29/2014   Procedure: INSERTION OF MESH;  Surgeon: Fanny Skates, MD;  Location: [facility];  Service: General;  Laterality: Right;  . TOE OSTEOTOMY Bilateral right 2002/  left 2013   great toe   Family History  Problem Relation Age of Onset  . Breast cancer Mother   . Hypertension Mother   . Depression Mother   . Cancer - Other Mother     unknown GI cancer  . Colon polyps Father   . Stroke Father   . Alzheimer's disease Father   . Colon cancer Neg Hx   . Rectal cancer Neg Hx   . Stomach cancer Neg Hx    Social History  Substance Use Topics  . Smoking status: Never Smoker  . Smokeless tobacco: Never Used  . Alcohol use Yes     Comment: rare    Review of Systems  Reason unable to perform ROS: as covered in HPI.  All other systems reviewed and are negative.   Allergies  Chamomile and Hydrocodone  Home Medications   Prior to Admission medications   Medication Sig Start Date End Date Taking? Authorizing Provider  amLODipine (NORVASC) 10 MG tablet Take 10 mg by mouth every morning.    Yes Historical Provider, MD  aspirin-acetaminophen-caffeine (EXCEDRIN MIGRAINE) [phone] MG tablet Take 1 tablet by mouth every 6 (six) hours as needed for headache.   Yes Historical  Provider, MD  buPROPion (WELLBUTRIN XL) 150 MG 24 hr tablet Take 450 mg by mouth every morning.    Yes Historical Provider, MD  clomiPHENE (CLOMID) 50 MG tablet Take 50 mg by mouth every morning.   Yes Historical Provider, MD  fexofenadine-pseudoephedrine (ALLEGRA-D 24) 180-240 MG per 24 hr tablet Take 1 tablet by mouth daily as needed.    Yes Historical Provider, MD  levothyroxine (SYNTHROID, LEVOTHROID) 100 MCG tablet Take 100 mcg by mouth daily before breakfast.   Yes Historical Provider, MD  MAGNESIUM PO Take 1 tablet by mouth daily.   Yes Historical Provider,  MD  sennosides-docusate sodium (SENOKOT-S) 8.6-50 MG tablet Take 1 tablet by mouth as needed for constipation.   Yes Historical Provider, MD  valsartan-hydrochlorothiazide (DIOVAN-HCT) 320-25 MG per tablet Take 1 tablet by mouth every morning.    Yes Historical Provider, MD  amoxicillin-clavulanate (AUGMENTIN) 875-125 MG tablet Take 1 tablet by mouth 2 (two) times daily. 11/19/16   Barnet Glasgow, NP  CIALIS 20 MG tablet TAKE 2 TABLETS BY MOUTH DAILY AS NEEDED 02/12/15   Historical Provider, MD  fluticasone (FLONASE) 50 MCG/ACT nasal spray instill 2 sprays into each nostril once daily  PRN 02/12/15   Historical Provider, MD  ondansetron (ZOFRAN ODT) 4 MG disintegrating tablet Take 1 tablet (4 mg total) by mouth every 8 (eight) hours as needed for nausea or vomiting. 11/19/16   Barnet Glasgow, NP  predniSONE (DELTASONE) 50 MG tablet Take one tablet daily with food 11/19/16   Barnet Glasgow, NP  TURMERIC PO Take 1 tablet by mouth daily.    Historical Provider, MD   Meds Ordered and Administered this Visit  Medications - No data to display  BP (!) 180/11 (BP Location: Left Arm)   Pulse 73   Temp 97.9 F (36.6 C) (Oral)   Resp 16   SpO2 99%  No data found.   Physical Exam  Constitutional: He is oriented to person, place, and time. He appears well-developed and well-nourished. No distress.  HENT:  Head: Normocephalic and atraumatic.  Right Ear: Tympanic membrane and external ear normal.  Left Ear: Tympanic membrane and external ear normal.  Nose: Nose normal. Right sinus exhibits no maxillary sinus tenderness and no frontal sinus tenderness. Left sinus exhibits no maxillary sinus tenderness and no frontal sinus tenderness.  Mouth/Throat: Uvula is midline and oropharynx is clear and moist. No oropharyngeal exudate.  Eyes: Pupils are equal, round, and reactive to light.  Neck: Normal range of motion. Neck supple. No JVD present.  Cardiovascular: Normal rate and regular rhythm.    Pulmonary/Chest: Effort normal and breath sounds normal. No respiratory distress. He has no wheezes.  Abdominal: Soft. Bowel sounds are normal.  Musculoskeletal: He exhibits no edema or tenderness.  Lymphadenopathy:    He has no cervical adenopathy.  Neurological: He is alert and oriented to person, place, and time.  Skin: Skin is warm and dry. Capillary refill takes less than 2 seconds. No rash noted. He is not diaphoretic. No erythema.  Psychiatric: He has a normal mood and affect. His behavior is normal.  Nursing note and vitals reviewed.   Urgent Care Course     Procedures (including critical care time)  Labs Review Labs Reviewed - No data to display  Imaging Review No results found.    MDM   1. Acute sinusitis, recurrence not specified, unspecified location    You have acute sinusitis. I have prescribed Augmentin to treat your infection. Take 1 tablet twice a  day for 10 days. I have also prescribed a steroid called prednisone, take one tablet daily with food. You may take Tylenol every 4 hours for fever, pain, or headache, not to exceed 4,000 mg a day, or you may take Ibuprofen every 6-8 hours, not to exceed 1600 mg a day. For congestion, you may use Flonase nasal spray 2 sprays, each nostril once a day, or a pseudoephedrine containing product daily. I also recommend Mucinex, or Mucinex DM twice daily with a full glass of water. For Nausea, I have prescribed Zofran, take 1 tablet under the tongue every 8 hours as needed.         Barnet Glasgow, NP 11/19/16 1439

## 2016-11-19 NOTE — Discharge Instructions (Signed)
You have acute sinusitis. I have prescribed Augmentin to treat your infection. Take 1 tablet twice a day for 10 days. I have also prescribed a steroid called prednisone, take one tablet daily with food. You may take Tylenol every 4 hours for fever, pain, or headache, not to exceed 4,000 mg a day, or you may take Ibuprofen every 6-8 hours, not to exceed 1600 mg a day. For congestion, you may use Flonase nasal spray 2 sprays, each nostril once a day, or a pseudoephedrine containing product daily. I also recommend Mucinex, or Mucinex DM twice daily with a full glass of water. For Nausea, I have prescribed Zofran, take 1 tablet under the tongue every 8 hours as needed.

## 2016-11-19 NOTE — ED Triage Notes (Signed)
Pt c/o cold sx onset: 2 weeks  Sx include: HA, facial pressures  Denies: nasal congestion/drainage, fevers  Taking: OTC cold meds w/temp relief.   A&O x4... NAD

## 2017-11-04 ENCOUNTER — Other Ambulatory Visit: Payer: Self-pay | Admitting: Internal Medicine

## 2017-11-04 ENCOUNTER — Ambulatory Visit
Admission: RE | Admit: 2017-11-04 | Discharge: 2017-11-04 | Disposition: A | Discharge status: Home / Self Care | Payer: BLUE CROSS/BLUE SHIELD | Source: Ambulatory Visit | Attending: Internal Medicine | Admitting: Internal Medicine

## 2017-11-04 DIAGNOSIS — M25552 Pain in left hip: Secondary | ICD-10-CM

## 2017-12-02 ENCOUNTER — Encounter (INDEPENDENT_AMBULATORY_CARE_PROVIDER_SITE_OTHER): Payer: Self-pay | Admitting: Orthopaedic Surgery

## 2017-12-02 ENCOUNTER — Ambulatory Visit (INDEPENDENT_AMBULATORY_CARE_PROVIDER_SITE_OTHER): Payer: BLUE CROSS/BLUE SHIELD | Admitting: Orthopaedic Surgery

## 2017-12-02 VITALS — BP 157/107 | HR 84 | Resp 18 | Ht 67.25 in | Wt 225.0 lb

## 2017-12-02 DIAGNOSIS — M25552 Pain in left hip: Secondary | ICD-10-CM | POA: Diagnosis not present

## 2017-12-02 NOTE — Progress Notes (Signed)
Office Visit Note   Patient: Robert Blair           Date of Birth: 11-25-1955           MRN: [id number] Visit Date: 12/02/2017              Requested by: [name], [address], [place], [place] [id number] PCP: [name], MD   Assessment & Plan: Visit Diagnoses:  1. Pain of left hip joint     Plan: Osteoarthritis left hip.  Long discussion regarding x-ray findings and treatment options.  Presently not having much pain.  In the future can consider cortisone injections, NSAIDs and even hip replacement.  We will plan to see back at any time in the future.  At this point I do not think he will need any specific treatment.  He can simply call us at any time we would consider intra-articular cortisone injection  Follow-Up Instructions: Return if symptoms worsen or fail to improve.   Orders:  No orders of the defined types were placed in this encounter.  No orders of the defined types were placed in this encounter.     Procedures: No procedures performed   Clinical Data: No additional findings.   Subjective: Chief Complaint  Patient presents with  . Left Hip - Pain    Left hip pain for 7 months  Recurrent episodes of left hip pain over a period 7 or 8 months.  He has had some while swinging a golf club.  And with twisting exercises with his left lower extremity.  Pain is experienced in his groin and anterior thigh and over the greater trochanter.  He recently saw Dr. Mancel Bale with x-rays demonstrating osteoarthritis.  He has tried some prednisone that made a "big difference"presently not having much pain.  Does not have any trouble sleeping.  No related back pain.  No numbness or tingling in the left lower extremity. I reviewed the films of his left hip on the PACS system and agree with the findings.  There is significant narrowing of the superior joint space associated with small peripheral osteophytes consistent with the osteoarthritis  HPI  Review of  Systems  Constitutional: Negative for fatigue and fever.  HENT: Negative for ear pain.   Eyes: Negative for pain.  Respiratory: Negative for cough and shortness of breath.   Cardiovascular: Negative for leg swelling.  Gastrointestinal: Positive for constipation. Negative for blood in stool and diarrhea.  Genitourinary: Negative for difficulty urinating.  Musculoskeletal: Negative for back pain.  Skin: Negative for rash and wound.  Allergic/Immunologic: Negative for food allergies.  Neurological: Positive for weakness. Negative for dizziness, light-headedness, numbness and headaches.  Hematological: Does not bruise/bleed easily.  Psychiatric/Behavioral: Positive for sleep disturbance.     Objective: Vital Signs: BP (!) 157/107 (BP Location: Right Arm, Patient Position: Sitting, Cuff Size: Normal)   Pulse 84   Resp 18   Ht 5' 7.25" (1.708 m)   Wt 225 lb (102.1 kg)   BMI 34.98 kg/m   Physical Exam  Ortho Exam awake alert and oriented x3.  Comfortable sitting.  Minimal limp with relation.  Pain on the extremes of internal/external rotation of his left hip.  No pain with motion of his right hip.  No pain with flexion extension.  Neurovascular exam intact left lower extremity.  Straight leg raise negative.  No knee pain.  No pain over greater trochanter.  Specialty Comments:  No specialty comments available.  Imaging: No results  found.   PMFS History: Patient Active Problem List   Diagnosis Date Noted  . Recurrent right inguinal hernia 10/29/2014  . Hypertension 01/01/2012  . Hypothyroidism 01/01/2012  . History of adenomatous polyp of colon 01/01/2012   Past Medical History:  Diagnosis Date  . Complication of anesthesia    URINARY RETENTION  . Depression   . History of adenomatous polyp of colon    2008/  2013/  2016  . History of basal cell carcinoma excision    2015  nose  . History of urinary retention    10-30-2014  POST OP  . Hypothyroidism   . Seasonal  allergies   . Unspecified essential hypertension     Family History  Problem Relation Age of Onset  . Breast cancer Mother   . Hypertension Mother   . Depression Mother   . Cancer - Other Mother        unknown GI cancer  . Colon polyps Father   . Stroke Father   . Alzheimer's disease Father   . Colon cancer Neg Hx   . Rectal cancer Neg Hx   . Stomach cancer Neg Hx     Past Surgical History:  Procedure Laterality Date  . COLONOSCOPY  last one 02-25-2015  . HYDROCELE EXCISION Right 08/26/2015   Procedure: RIGHT HYDROCELECTOMY ADULT;  Surgeon: Franchot Gallo, MD;  [facility];  Service: Urology;  Laterality: Right;  . INGUINAL HERNIA REPAIR Right 10/29/2014   Procedure: LAPAROSCOPIC REPAIR RECURRENT RIGHT INGUINAL ;  Surgeon: Fanny Skates, MD;  Location: [facility];  Service: General;  Laterality: Right;  . INGUINAL HERNIA REPAIR Right 1984  . INSERTION OF MESH Right 10/29/2014   Procedure: INSERTION OF MESH;  Surgeon: Fanny Skates, MD;  Location: [facility];  Service: General;  Laterality: Right;  . TOE OSTEOTOMY Bilateral right 2002/  left 2013   great toe   Social History   Occupational History  . Occupation: self employed  Tobacco Use  . Smoking status: Never Smoker  . Smokeless tobacco: Never Used  Substance and Sexual Activity  . Alcohol use: Yes    Comment: rare  . Drug use: No  . Sexual activity: Not on file

## 2017-12-06 ENCOUNTER — Encounter: Payer: Self-pay | Admitting: Internal Medicine

## 2018-01-11 ENCOUNTER — Ambulatory Visit
Admission: RE | Admit: 2018-01-11 | Discharge: 2018-01-11 | Disposition: A | Discharge status: Home / Self Care | Payer: BLUE CROSS/BLUE SHIELD | Source: Ambulatory Visit | Attending: Internal Medicine | Admitting: Internal Medicine

## 2018-01-11 ENCOUNTER — Other Ambulatory Visit: Payer: Self-pay | Admitting: Internal Medicine

## 2018-01-11 DIAGNOSIS — M79642 Pain in left hand: Secondary | ICD-10-CM

## 2018-02-14 ENCOUNTER — Other Ambulatory Visit: Payer: Self-pay

## 2018-02-14 ENCOUNTER — Ambulatory Visit (AMBULATORY_SURGERY_CENTER): Payer: Self-pay | Admitting: *Deleted

## 2018-02-14 VITALS — Ht 67.0 in | Wt 221.2 lb

## 2018-02-14 DIAGNOSIS — Z860100 Personal history of colon polyps, unspecified: Secondary | ICD-10-CM

## 2018-02-14 MED ORDER — NA SULFATE-K SULFATE-MG SULF 17.5-3.13-1.6 GM/177ML PO SOLN: 1.0000 | Freq: Once | ORAL | 0 refills | Status: AC

## 2018-02-14 NOTE — Progress Notes (Signed)
No egg or soy allergy known to patient  No issues with past sedation with any surgeries  or procedures, no intubation problems  No diet pills per patient No home 02 use per patient  No blood thinners per patient  Pt states he has  issues with constipation , patient uses 2 ex lax tablets daily with regular soft BM No A fib or A flutter  EMMI video sent to pt's e mail ,patient declined PAY NO MORE THAN 50 COUPON FOR SUPREP GIVEN TO PT IN PV TODAY

## 2018-02-18 ENCOUNTER — Encounter: Payer: Self-pay | Admitting: Internal Medicine

## 2018-02-28 ENCOUNTER — Ambulatory Visit (AMBULATORY_SURGERY_CENTER): Payer: BLUE CROSS/BLUE SHIELD | Admitting: Internal Medicine

## 2018-02-28 ENCOUNTER — Encounter: Payer: Self-pay | Admitting: Internal Medicine

## 2018-02-28 ENCOUNTER — Other Ambulatory Visit: Payer: Self-pay

## 2018-02-28 VITALS — BP 116/80 | HR 70 | Temp 98.4°F | Resp 12 | Ht 67.0 in | Wt 221.0 lb

## 2018-02-28 DIAGNOSIS — K635 Polyp of colon: Secondary | ICD-10-CM

## 2018-02-28 DIAGNOSIS — Z86010 Personal history of colonic polyps: Secondary | ICD-10-CM

## 2018-02-28 DIAGNOSIS — D125 Benign neoplasm of sigmoid colon: Secondary | ICD-10-CM

## 2018-02-28 DIAGNOSIS — D124 Benign neoplasm of descending colon: Secondary | ICD-10-CM

## 2018-02-28 MED ORDER — SODIUM CHLORIDE 0.9 % IV SOLN: 500.0000 mL | Freq: Once | INTRAVENOUS | Status: DC

## 2018-02-28 NOTE — Progress Notes (Signed)
A and O x3. Report to RN. Tolerated MAC anesthesia well.

## 2018-02-28 NOTE — Patient Instructions (Signed)
Discharge instructions given. Handouts on polyps,diverticulosis and hemorrhoids. Resume previous medications. YOU HAD AN ENDOSCOPIC PROCEDURE TODAY AT THE [facility]:   Refer to the procedure report that was given to you for any specific questions about what was found during the examination.  If the procedure report does not answer your questions, please call your gastroenterologist to clarify.  If you requested that your care partner not be given the details of your procedure findings, then the procedure report has been included in a sealed envelope for you to review at your convenience later.  YOU SHOULD EXPECT: Some feelings of bloating in the abdomen. Passage of more gas than usual.  Walking can help get rid of the air that was put into your GI tract during the procedure and reduce the bloating. If you had a lower endoscopy (such as a colonoscopy or flexible sigmoidoscopy) you may notice spotting of blood in your stool or on the toilet paper. If you underwent a bowel prep for your procedure, you may not have a normal bowel movement for a few days.  Please Note:  You might notice some irritation and congestion in your nose or some drainage.  This is from the oxygen used during your procedure.  There is no need for concern and it should clear up in a day or so.  SYMPTOMS TO REPORT IMMEDIATELY:   Following lower endoscopy (colonoscopy or flexible sigmoidoscopy):  Excessive amounts of blood in the stool  Significant tenderness or worsening of abdominal pains  Swelling of the abdomen that is new, acute  Fever of 100F or higher   For urgent or emergent issues, a gastroenterologist can be reached at any hour by calling [phone].   DIET:  We do recommend a small meal at first, but then you may proceed to your regular diet.  Drink plenty of fluids but you should avoid alcoholic beverages for 24 hours.  ACTIVITY:  You should plan to take it easy for the rest of today and you  should NOT DRIVE or use heavy machinery until tomorrow (because of the sedation medicines used during the test).    FOLLOW UP: Our staff will call the number listed on your records the next business day following your procedure to check on you and address any questions or concerns that you may have regarding the information given to you following your procedure. If we do not reach you, we will leave a message.  However, if you are feeling well and you are not experiencing any problems, there is no need to return our call.  We will assume that you have returned to your regular daily activities without incident.  If any biopsies were taken you will be contacted by phone or by letter within the next 1-3 weeks.  Please call us at [phone] if you have not heard about the biopsies in 3 weeks.    SIGNATURES/CONFIDENTIALITY: You and/or your care partner have signed paperwork which will be entered into your electronic medical record.  These signatures attest to the fact that that the information above on your After Visit Summary has been reviewed and is understood.  Full responsibility of the confidentiality of this discharge information lies with you and/or your care-partner. 

## 2018-02-28 NOTE — Progress Notes (Signed)
Called to room to assist during endoscopic procedure.  Patient ID and intended procedure confirmed with present staff. Received instructions for my participation in the procedure from the performing physician.  

## 2018-02-28 NOTE — Op Note (Signed)
[facility] Patient Name: Robert Blair Procedure Date: 02/28/2018 8:48 AM MRN: [id number] Endoscopist: Jerene Bears , MD Age: 62 Referring MD:  Date of Birth: 02/22/1956 Gender: Male Account #: [id number] Procedure:                Colonoscopy Indications:              Surveillance: Personal history of adenomatous                            polyps on last colonoscopy 3 years ago Medicines:                Monitored Anesthesia Care Procedure:                Pre-Anesthesia Assessment:                           - Prior to the procedure, a History and Physical                            was performed, and patient medications and                            allergies were reviewed. The patient's tolerance of                            previous anesthesia was also reviewed. The risks                            and benefits of the procedure and the sedation                            options and risks were discussed with the patient.                            All questions were answered, and informed consent                            was obtained. Prior Anticoagulants: The patient has                            taken no previous anticoagulant or antiplatelet                            agents. ASA Grade Assessment: II - A patient with                            mild systemic disease. After reviewing the risks                            and benefits, the patient was deemed in                            satisfactory condition to undergo the procedure.  After obtaining informed consent, the colonoscope                            was passed under direct vision. Throughout the                            procedure, the patient's blood pressure, pulse, and                            oxygen saturations were monitored continuously. The                            Colonoscope was introduced through the anus and                            advanced to the cecum,  identified by appendiceal                            orifice and ileocecal valve. The colonoscopy was                            performed without difficulty. The patient tolerated                            the procedure well. The quality of the bowel                            preparation was good. The ileocecal valve,                            appendiceal orifice, and rectum were photographed. Scope In: 8:54:48 AM Scope Out: 9:10:13 AM Scope Withdrawal Time: 0 hours 13 minutes 41 seconds  Total Procedure Duration: 0 hours 15 minutes 25 seconds  Findings:                 The digital rectal exam was normal.                           A 1 mm polyp was found in the descending colon. The                            polyp was sessile. The polyp was removed with a                            cold biopsy forceps. Resection and retrieval were                            complete.                           A 5 mm polyp was found in the sigmoid colon. The                            polyp was sessile. The polyp was  removed with a                            cold snare. Resection and retrieval were complete.                           Multiple small and large-mouthed diverticula were                            found in the sigmoid colon and descending colon.                           Internal hemorrhoids were found during                            retroflexion. The hemorrhoids were small. Complications:            No immediate complications. Estimated Blood Loss:     Estimated blood loss was minimal. Impression:               - One 1 mm polyp in the descending colon, removed                            with a cold biopsy forceps. Resected and retrieved.                           - One 5 mm polyp in the sigmoid colon, removed with                            a cold snare. Resected and retrieved.                           - Moderate diverticulosis in the sigmoid colon and                            in the  descending colon.                           - Internal hemorrhoids. Recommendation:           - Patient has a contact number available for                            emergencies. The signs and symptoms of potential                            delayed complications were discussed with the                            patient. Return to normal activities tomorrow.                            Written discharge instructions were provided to the                            patient.                           -  Resume previous diet.                           - Continue present medications.                           - Await pathology results.                           - Repeat colonoscopy is recommended for                            surveillance. The colonoscopy date will be                            determined after pathology results from today's                            exam become available for review. Jerene Bears, MD 02/28/2018 9:15:41 AM This report has been signed electronically.

## 2018-02-28 NOTE — Progress Notes (Signed)
Pt. Reports no change in his medical or surgical history since his pre-visit 02/14/2018.

## 2018-03-01 ENCOUNTER — Telehealth: Payer: Self-pay

## 2018-03-01 NOTE — Telephone Encounter (Signed)
  Follow up Call-  Call back number 02/28/2018  Post procedure Call Back phone  # [phone]  Permission to leave phone message Yes  Some recent data might be hidden     Patient questions:  Do you have a fever, pain , or abdominal swelling? No. Pain Score  0 *  Have you tolerated food without any problems? Yes.    Have you been able to return to your normal activities? Yes.    Do you have any questions about your discharge instructions: Diet   No. Medications  No. Follow up visit  No.  Do you have questions or concerns about your Care? No.  Actions: * If pain score is 4 or above: No action needed, pain <4.

## 2018-03-03 ENCOUNTER — Encounter (INDEPENDENT_AMBULATORY_CARE_PROVIDER_SITE_OTHER): Payer: Self-pay | Admitting: Orthopaedic Surgery

## 2018-03-03 ENCOUNTER — Ambulatory Visit (INDEPENDENT_AMBULATORY_CARE_PROVIDER_SITE_OTHER): Payer: BLUE CROSS/BLUE SHIELD | Admitting: Orthopaedic Surgery

## 2018-03-03 VITALS — BP 166/93 | HR 77 | Ht 67.0 in | Wt 221.0 lb

## 2018-03-03 DIAGNOSIS — M79642 Pain in left hand: Secondary | ICD-10-CM

## 2018-03-03 MED ORDER — LIDOCAINE HCL 1 % IJ SOLN
1.0000 mL | INTRAMUSCULAR | Status: AC | PRN
  Administered 2018-03-03: 1 mL

## 2018-03-03 MED ORDER — METHYLPREDNISOLONE ACETATE 40 MG/ML IJ SUSP
40.0000 mg | INTRAMUSCULAR | Status: AC | PRN
  Administered 2018-03-03: 40 mg

## 2018-03-03 MED ORDER — BUPIVACAINE HCL 0.5 % IJ SOLN
1.0000 mL | INTRAMUSCULAR | Status: AC | PRN
  Administered 2018-03-03: 1 mL

## 2018-03-03 NOTE — Progress Notes (Signed)
Office Visit Note   Patient: Robert Blair           Date of Birth: February 22, 1956           MRN: [id number] Visit Date: 03/03/2018              Requested by: [name], [address], [place],  [address] PCP: [name], MD   Assessment & Plan: Visit Diagnoses:  1. Pain of left hand     Plan: De Quervain's tenosynovitis left wrist.  Discussion regarding diagnosis and treatment options.  Will inject with cortisone and apply a splint.  Return as needed  Follow-Up Instructions: Return if symptoms worsen or fail to improve.   Orders:  Orders Placed This Encounter  Procedures  . Hand/UE Inj: L extensor compartment 1   No orders of the defined types were placed in this encounter.     Procedures: Hand/UE Inj: L extensor compartment 1 for de Quervain's tenosynovitis on 03/03/2018 10:03 AM Medications: 1 mL lidocaine 1 %; 1 mL bupivacaine 0.5 %; 40 mg methylPREDNISolone acetate 40 MG/ML      Clinical Data: No additional findings.   Subjective: Chief Complaint  Patient presents with  . Follow-up    l wrist pain doing yard work about 2 mo ago  Persistent pain dorsal and radial aspect left wrist the past 2 months which Mr.Hewins . feels is related to doing a lot of yard work.  No numbness or tingling.  Pain is localized in the first dorsal extensor compartment pain has fair amount of pain with motion of his thumb related to that same area.  Films of his left hand had been performed at the end of April.  I reviewed these in the PACS system without any abnormality on the distal wrist and specifically the radial aspect of the wrist  HPI  Review of Systems  Constitutional: Negative for fatigue.  HENT: Negative for ear pain.   Eyes: Negative for pain.  Respiratory: Negative for cough and shortness of breath.   Cardiovascular: Negative for leg swelling.  Gastrointestinal: Positive for constipation. Negative for diarrhea.  Genitourinary: Negative for  difficulty urinating.  Musculoskeletal: Negative for back pain and neck pain.  Skin: Negative for rash.  Neurological: Negative for weakness and light-headedness.  Hematological: Does not bruise/bleed easily.  Psychiatric/Behavioral: Positive for sleep disturbance.     Objective: Vital Signs: BP (!) 166/93 (BP Location: Left Arm, Patient Position: Sitting, Cuff Size: Normal)   Pulse 77   Ht 5\' 7"  (1.702 m)   Wt 221 lb (100.2 kg)   BMI 34.61 kg/m   Physical Exam  Constitutional: He is oriented to person, place, and time. He appears well-developed and well-nourished.  HENT:  Mouth/Throat: Oropharynx is clear and moist.  Eyes: Pupils are equal, round, and reactive to light. EOM are normal.  Pulmonary/Chest: Effort normal.  Neurological: He is alert and oriented to person, place, and time.  Skin: Skin is warm and dry.  Psychiatric: He has a normal mood and affect. His behavior is normal.    Ortho Exam awake alert and oriented x3.  Comfortable sitting.  Exam limited to left wrist where there was pain directly over the first dorsal extensor compartment.  Positive Finkelstein's test.  Skin intact.  No crepitation.  Neurovascular exam intact  Specialty Comments:  No specialty comments available.  Imaging: No results found.   PMFS History: Patient Active Problem List   Diagnosis Date Noted  . Recurrent right inguinal  hernia 10/29/2014  . Hypertension 01/01/2012  . Hypothyroidism 01/01/2012  . History of adenomatous polyp of colon 01/01/2012   Past Medical History:  Diagnosis Date  . Arthritis   . Cancer ([place]) 2016   Basal cell  . Complication of anesthesia    URINARY RETENTION  . Depression   . History of adenomatous polyp of colon    2008/  2013/  2016  . History of basal cell carcinoma excision    2015  nose  . History of urinary retention    10-30-2014  POST OP  . Hypothyroidism   . Seasonal allergies   . Unspecified essential hypertension     Family History    Problem Relation Age of Onset  . Breast cancer Mother   . Hypertension Mother   . Depression Mother   . Cancer - Other Mother        unknown GI cancer  . Colon polyps Father   . Stroke Father   . Alzheimer's disease Father   . Colon cancer Neg Hx   . Rectal cancer Neg Hx   . Stomach cancer Neg Hx   . Liver disease Neg Hx     Past Surgical History:  Procedure Laterality Date  . COLONOSCOPY  last one 02-25-2015  . HYDROCELE EXCISION Right 08/26/2015   Procedure: RIGHT HYDROCELECTOMY ADULT;  Surgeon: Franchot Gallo, MD;  Location: [facility];  Service: Urology;  Laterality: Right;  . INGUINAL HERNIA REPAIR Right 10/29/2014   Procedure: LAPAROSCOPIC REPAIR RECURRENT RIGHT INGUINAL ;  Surgeon: Fanny Skates, MD;  Location: [facility];  Service: General;  Laterality: Right;  . INGUINAL HERNIA REPAIR Right 1984  . INSERTION OF MESH Right 10/29/2014   Procedure: INSERTION OF MESH;  Surgeon: Fanny Skates, MD;  Location: [facility];  Service: General;  Laterality: Right;  . POLYPECTOMY    . TOE OSTEOTOMY Bilateral right 2002/  left 2013   great toe   Social History   Occupational History  . Occupation: self employed  Tobacco Use  . Smoking status: Never Smoker  . Smokeless tobacco: Never Used  Substance and Sexual Activity  . Alcohol use: Yes    Comment: rare  . Drug use: No  . Sexual activity: Not on file

## 2018-03-07 ENCOUNTER — Encounter: Payer: Self-pay | Admitting: Internal Medicine

## 2018-04-21 ENCOUNTER — Ambulatory Visit (INDEPENDENT_AMBULATORY_CARE_PROVIDER_SITE_OTHER): Payer: Self-pay

## 2018-04-21 ENCOUNTER — Encounter (INDEPENDENT_AMBULATORY_CARE_PROVIDER_SITE_OTHER): Payer: Self-pay | Admitting: Orthopaedic Surgery

## 2018-04-21 ENCOUNTER — Ambulatory Visit (INDEPENDENT_AMBULATORY_CARE_PROVIDER_SITE_OTHER): Payer: BLUE CROSS/BLUE SHIELD | Admitting: Orthopaedic Surgery

## 2018-04-21 VITALS — BP 160/111 | HR 88 | Resp 16 | Ht 67.0 in | Wt 221.0 lb

## 2018-04-21 DIAGNOSIS — M1612 Unilateral primary osteoarthritis, left hip: Secondary | ICD-10-CM | POA: Diagnosis not present

## 2018-04-21 DIAGNOSIS — M7542 Impingement syndrome of left shoulder: Secondary | ICD-10-CM

## 2018-04-21 DIAGNOSIS — M25512 Pain in left shoulder: Secondary | ICD-10-CM | POA: Diagnosis not present

## 2018-04-21 MED ORDER — BUPIVACAINE HCL 0.5 % IJ SOLN
2.0000 mL | INTRAMUSCULAR | Status: AC | PRN
  Administered 2018-04-21: 2 mL via INTRA_ARTICULAR

## 2018-04-21 MED ORDER — METHYLPREDNISOLONE ACETATE 40 MG/ML IJ SUSP
40.0000 mg | INTRAMUSCULAR | Status: AC | PRN
  Administered 2018-04-21: 40 mg via INTRA_ARTICULAR

## 2018-04-21 MED ORDER — LIDOCAINE HCL 2 % IJ SOLN
2.0000 mL | INTRAMUSCULAR | Status: AC | PRN
  Administered 2018-04-21: 2 mL

## 2018-04-21 NOTE — Progress Notes (Signed)
Office Visit Note   Patient: Robert Blair           Date of Birth: Dec 13, 1955           MRN: [id number] Visit Date: 04/21/2018              Requested by: [name], MD [address], [place], [place] [address] PCP: [name], MD   Assessment & Plan: Visit Diagnoses:  1. Impingement syndrome of left shoulder   2. Left shoulder pain, unspecified chronicity   3. Unilateral primary osteoarthritis, left hip     Plan:  #1: Corticosteroid injection to the left shoulder subacromially was performed with good results postinjection. #2: If his symptoms do not effectively eliminated then we will consider MRI scan of the left shoulder to rule out cuff pathology. #3: Continue conservative measures for his left hip osteoarthritis.  He would probably have to have a total hip replacement in the future.  Follow-Up Instructions: Return if symptoms worsen or fail to improve.   Orders:  Orders Placed This Encounter  Procedures  . Large Joint Inj  . XR Shoulder Left   No orders of the defined types were placed in this encounter.     Procedures: Large Joint Inj: L subacromial bursa on 04/21/2018 12:26 PM Indications: pain and diagnostic evaluation Details: 25 G 1.5 in needle, anterolateral approach  Arthrogram: No  Medications: 2 mL lidocaine 2 %; 2 mL bupivacaine 0.5 %; 40 mg methylPREDNISolone acetate 40 MG/ML Procedure, treatment alternatives, risks and benefits explained, specific risks discussed. Consent was given by the patient. Immediately prior to procedure a time out was called to verify the correct patient, procedure, equipment, support staff and site/side marked as required. Patient was prepped and draped in the usual sterile fashion.       Clinical Data: No additional findings.   Subjective: Chief Complaint  Patient presents with  . Left Shoulder - Pain  . Left Hip - Pain  . Shoulder Pain    Left shoulder pain x 3 months, no shoulder surgery, not  diabetic, wants injection, limited range of motion, weakness, tightness  . Hip Pain    Left hip pain x 1 year, weakness in leg, dragging leg to climb stairs, no hip surgery, difficulty walking    HPI  Luay is a very pleasant 62 year old white male who is seen today for evaluation of his left shoulder and left hip.  Regards to his left shoulder he has had about 3-month history of pain in the left shoulder especially more in the anterior lateral aspect.  He is finding that he does not have as much motion as he had previously.  He does have some weakness and feels tight at times.  Does not remember any history of injury or trauma.  Review of Systems  Constitutional: Positive for activity change.  HENT: Negative for trouble swallowing.   Eyes: Negative for pain.  Respiratory: Negative for shortness of breath.   Cardiovascular: Negative for chest pain.  Gastrointestinal: Positive for constipation.  Endocrine: Negative for cold intolerance.  Genitourinary: Positive for difficulty urinating.  Musculoskeletal: Positive for neck stiffness.  Skin: Negative for rash.  Allergic/Immunologic: Negative for food allergies.  Neurological: Positive for weakness.  Psychiatric/Behavioral: Negative for sleep disturbance.     Objective: Vital Signs: BP (!) 160/111 (BP Location: Right Arm, Patient Position: Sitting, Cuff Size: Normal)   Pulse 88   Resp 16   Ht 5\' 7"  (1.702 m)   Wt 221 lb (  100.2 kg)   BMI 34.61 kg/m   Physical Exam  Constitutional: He is oriented to person, place, and time. He appears well-developed and well-nourished.  HENT:  Mouth/Throat: Oropharynx is clear and moist.  Eyes: Pupils are equal, round, and reactive to light. EOM are normal.  Pulmonary/Chest: Effort normal.  Neurological: He is alert and oriented to person, place, and time.  Skin: Skin is warm and dry.  Psychiatric: He has a normal mood and affect. His behavior is normal.    Ortho Exam  Examination today of  the left shoulder reveals 135 degrees of abduction and 135 degrees of forward flexion with pain at that extreme.  The arm at 90 degrees of abduction external rotation to 70 degrees internal rotation to about 40 to 45 degrees.  Empty can test is negative.  Positive speeds test  The left hip reveals range of motion to about 100 degrees of forward flexion internal and external rotation about 20 degrees.  Specialty Comments:  No specialty comments available.  Imaging: Xr Shoulder Left  Result Date: 04/21/2018 4 view x-ray of the left shoulder reveals maintenance of joint space.  Has a type II acromion.  Does have some AC degenerative changes also.    PMFS History: Current Outpatient Medications  Medication Sig Dispense Refill  . amLODipine (NORVASC) 10 MG tablet Take 10 mg by mouth every morning.     Marland Kitchen aspirin-acetaminophen-caffeine (EXCEDRIN MIGRAINE) 250-250-65 MG tablet Take 1 tablet by mouth every 6 (six) hours as needed for headache.    Marland Kitchen buPROPion (WELLBUTRIN XL) 150 MG 24 hr tablet Take 450 mg by mouth every morning.     . Cholecalciferol (VITAMIN D PO) Take by mouth.    . CIALIS 20 MG tablet TAKE 2 TABLETS BY MOUTH DAILY AS NEEDED  0  . clomiPHENE (CLOMID) 50 MG tablet Take 50 mg by mouth every morning.    . Cyanocobalamin (VITAMIN B 12 PO) Take by mouth.    . Docosahexaenoic Acid (DHA PO) Take by mouth.    . fexofenadine-pseudoephedrine (ALLEGRA-D 24) 180-240 MG per 24 hr tablet Take 1 tablet by mouth daily as needed.     . fluticasone (FLONASE) 50 MCG/ACT nasal spray instill 2 sprays into each nostril once daily  PRN  0  . levothyroxine (SYNTHROID, LEVOTHROID) 112 MCG tablet   2  . MAGNESIUM PO Take 1 tablet by mouth daily.    . Multiple Vitamin (MULTIVITAMIN) tablet Take 1 tablet by mouth daily.    Marland Kitchen POTASSIUM PO Take by mouth.    . Sennosides (EX-LAX PO) Take 2 tablets by mouth.    . sennosides-docusate sodium (SENOKOT-S) 8.6-50 MG tablet Take 1 tablet by mouth as needed for  constipation.    . valsartan-hydrochlorothiazide (DIOVAN-HCT) 320-25 MG per tablet Take 1 tablet by mouth every morning.     Marland Kitchen levothyroxine (SYNTHROID, LEVOTHROID) 100 MCG tablet   3  . TURMERIC PO Take 1 tablet by mouth daily.     Current Facility-Administered Medications  Medication Dose Route Frequency Provider Last Rate Last Dose  . 0.9 %  sodium chloride infusion  500 mL Intravenous Once Pyrtle, Lajuan Lines, MD         Patient Active Problem List   Diagnosis Date Noted  . Recurrent right inguinal hernia 10/29/2014  . Hypertension 01/01/2012  . Hypothyroidism 01/01/2012  . History of adenomatous polyp of colon 01/01/2012   Past Medical History:  Diagnosis Date  . Arthritis   . Cancer ([place]) 2016  Basal cell  . Complication of anesthesia    URINARY RETENTION  . Depression   . History of adenomatous polyp of colon    2008/  2013/  2016  . History of basal cell carcinoma excision    2015  nose  . History of urinary retention    10-30-2014  POST OP  . Hypothyroidism   . Seasonal allergies   . Unspecified essential hypertension     Family History  Problem Relation Age of Onset  . Breast cancer Mother   . Hypertension Mother   . Depression Mother   . Cancer - Other Mother        unknown GI cancer  . Colon polyps Father   . Stroke Father   . Alzheimer's disease Father   . Colon cancer Neg Hx   . Rectal cancer Neg Hx   . Stomach cancer Neg Hx   . Liver disease Neg Hx     Past Surgical History:  Procedure Laterality Date  . COLONOSCOPY  last one 02-25-2015  . HYDROCELE EXCISION Right 08/26/2015   Procedure: RIGHT HYDROCELECTOMY ADULT;  Surgeon: Franchot Gallo, MD;  [facility];  Service: Urology;  Laterality: Right;  . INGUINAL HERNIA REPAIR Right 10/29/2014   Procedure: LAPAROSCOPIC REPAIR RECURRENT RIGHT INGUINAL ;  Surgeon: Fanny Skates, MD;  Location: [facility];  Service: General;  Laterality: Right;  . INGUINAL HERNIA REPAIR Right 1984    . INSERTION OF MESH Right 10/29/2014   Procedure: INSERTION OF MESH;  Surgeon: Fanny Skates, MD;  Location: [facility];  Service: General;  Laterality: Right;  . POLYPECTOMY    . TOE OSTEOTOMY Bilateral right 2002/  left 2013   great toe   Social History   Occupational History  . Occupation: self employed  Tobacco Use  . Smoking status: Never Smoker  . Smokeless tobacco: Never Used  Substance and Sexual Activity  . Alcohol use: Yes    Comment: rare  . Drug use: No  . Sexual activity: Not on file

## 2018-05-10 ENCOUNTER — Other Ambulatory Visit (INDEPENDENT_AMBULATORY_CARE_PROVIDER_SITE_OTHER): Payer: Self-pay | Admitting: Radiology

## 2018-05-10 ENCOUNTER — Telehealth (INDEPENDENT_AMBULATORY_CARE_PROVIDER_SITE_OTHER): Payer: Self-pay | Admitting: Orthopaedic Surgery

## 2018-05-10 DIAGNOSIS — G8929 Other chronic pain: Secondary | ICD-10-CM

## 2018-05-10 DIAGNOSIS — M25512 Pain in left shoulder: Principal | ICD-10-CM

## 2018-05-10 NOTE — Telephone Encounter (Signed)
Patient called stating he had an appointment on 04/21/18 and was told that if his shoulder didn't get better to call the office and Dr. Durward Fortes / Aaron Edelman would schedule an MRI.

## 2018-05-10 NOTE — Telephone Encounter (Signed)
SENT REFERRAL IN

## 2018-05-10 NOTE — Telephone Encounter (Signed)
Please advise 

## 2018-05-10 NOTE — Telephone Encounter (Signed)
Scheduler MRI left shoulder

## 2018-05-20 ENCOUNTER — Other Ambulatory Visit (INDEPENDENT_AMBULATORY_CARE_PROVIDER_SITE_OTHER): Payer: Self-pay | Admitting: *Deleted

## 2018-05-20 ENCOUNTER — Ambulatory Visit
Admission: RE | Admit: 2018-05-20 | Discharge: 2018-05-20 | Disposition: A | Discharge status: Home / Self Care | Payer: BLUE CROSS/BLUE SHIELD | Source: Ambulatory Visit | Attending: Orthopaedic Surgery | Admitting: Orthopaedic Surgery

## 2018-05-20 DIAGNOSIS — M25512 Pain in left shoulder: Principal | ICD-10-CM

## 2018-05-20 DIAGNOSIS — G8929 Other chronic pain: Secondary | ICD-10-CM

## 2018-05-25 ENCOUNTER — Telehealth (INDEPENDENT_AMBULATORY_CARE_PROVIDER_SITE_OTHER): Payer: Self-pay | Admitting: Orthopaedic Surgery

## 2018-05-25 NOTE — Telephone Encounter (Signed)
Beverlee Nims from [facility] called requesting the PT referral for patient be faxed to their office.   Fax [phone]

## 2018-05-25 NOTE — Telephone Encounter (Signed)
REFAXED REFERRAL,OV NOTE AND INS CARD TO [phone]

## 2018-07-18 ENCOUNTER — Telehealth (INDEPENDENT_AMBULATORY_CARE_PROVIDER_SITE_OTHER): Payer: Self-pay | Admitting: Orthopaedic Surgery

## 2018-07-18 NOTE — Telephone Encounter (Signed)
Patient called stating he needs a new referral sent to [facility] so he can continue physical therapy with his left shoulder.  Patient states he would also like to add his left hip to the referral.  Patient requested a return call to let him know that the referral has been sent.

## 2018-07-18 NOTE — Telephone Encounter (Signed)
yes

## 2018-07-18 NOTE — Telephone Encounter (Signed)
CAN I SEND IN ANOTHER REFERRAL

## 2018-07-19 ENCOUNTER — Other Ambulatory Visit: Payer: Self-pay | Admitting: Radiology

## 2018-07-19 DIAGNOSIS — M25552 Pain in left hip: Secondary | ICD-10-CM

## 2018-07-19 DIAGNOSIS — G8929 Other chronic pain: Secondary | ICD-10-CM

## 2018-07-19 DIAGNOSIS — M25512 Pain in left shoulder: Principal | ICD-10-CM

## 2018-07-19 NOTE — Telephone Encounter (Signed)
Sent referrals in and notified pt.

## 2018-08-29 ENCOUNTER — Encounter (INDEPENDENT_AMBULATORY_CARE_PROVIDER_SITE_OTHER): Payer: Self-pay | Admitting: Orthopaedic Surgery

## 2018-08-29 ENCOUNTER — Ambulatory Visit (INDEPENDENT_AMBULATORY_CARE_PROVIDER_SITE_OTHER): Payer: BLUE CROSS/BLUE SHIELD | Admitting: Orthopaedic Surgery

## 2018-08-29 VITALS — BP 179/116 | HR 77 | Ht 67.0 in | Wt 218.0 lb

## 2018-08-29 DIAGNOSIS — M25512 Pain in left shoulder: Secondary | ICD-10-CM

## 2018-08-29 DIAGNOSIS — G8929 Other chronic pain: Secondary | ICD-10-CM | POA: Diagnosis not present

## 2018-08-29 DIAGNOSIS — M25552 Pain in left hip: Secondary | ICD-10-CM | POA: Diagnosis not present

## 2018-08-29 NOTE — Progress Notes (Signed)
Office Visit Note   Patient: Robert Blair           Date of Birth: 1956-05-10           MRN: [id number] Visit Date: 08/29/2018              Requested by: [name], MD [address], [place], [place] [address] PCP: [name], MD   Assessment & Plan: Visit Diagnoses:  1. Pain in left hip   2. Chronic left shoulder pain     Plan: Recurrent impingement syndrome left shoulder.  Will repeat subacromial cortisone injection.  At some point we want to consider MRI scan.  Has osteoarthritis left hip.  We will schedule intra-articular cortisone injection Follow-Up Instructions: Return if symptoms worsen or fail to improve.   Orders:  Orders Placed This Encounter  Procedures  . DL FLUORO GUIDED NEEDLE PLC ASPIRATION / INJECTTION/LOC   No orders of the defined types were placed in this encounter.     Procedures: No procedures performed   Clinical Data: No additional findings.   Subjective: Chief Complaint  Patient presents with  . Left Shoulder - Pain  . Shoulder Pain     having pain in his left shoulder wants an injection   Mr. Dyar has an established history of osteoarthritis left hip by prior films earlier in the year.  He is tried over-the-counter medicines.  He does have a limp.  He recently was having quite a bit of pain while driving in the car and sitting in a meeting.  He seems to do better when he is ambulating but worse when he sitting.  We had discussed intra-articular cortisone injection the past he like to set that up.  Films were performed in February demonstrating decreased in the left hip joint space with peripheral osteophytes  He also has been seen for impingement syndrome left shoulder.  In August I injected the subacromial space with cortisone and he notes it made a big difference only to have recurrent pain in the last several weeks.  Pain is localized to the shoulder without numbness or tingling.  He said difficulty raising his arm over  his head with no history of injury  HPI  Review of Systems   Objective: Vital Signs: BP (!) 179/116   Pulse 77   Ht 5\' 7"  (1.702 m)   Wt 218 lb (98.9 kg)   BMI 34.14 kg/m   Physical Exam Constitutional:      Appearance: He is well-developed.  Eyes:     Pupils: Pupils are equal, round, and reactive to light.  Pulmonary:     Effort: Pulmonary effort is normal.  Skin:    General: Skin is warm and dry.  Neurological:     Mental Status: He is alert and oriented to person, place, and time.  Psychiatric:        Behavior: Behavior normal.     Ortho Exam positive impingement left shoulder and minimally positive empty can test.  Skin intact.  Biceps intact.  Good grip and good release.  No evidence of instability or adhesive capsulitis. Walks with a slight limp referable to his left hip.  Some decreased range of motion of the left hip with internal and external rotation.  Leg lengths appear to be symmetrical.  Neurovascular exam intact  Specialty Comments:  No specialty comments available.  Imaging: No results found.   PMFS History: Patient Active Problem List   Diagnosis Date Noted  . Recurrent right  inguinal hernia 10/29/2014  . Hypertension 01/01/2012  . Hypothyroidism 01/01/2012  . History of adenomatous polyp of colon 01/01/2012   Past Medical History:  Diagnosis Date  . Arthritis   . Cancer ([place]) 2016   Basal cell  . Complication of anesthesia    URINARY RETENTION  . Depression   . History of adenomatous polyp of colon    2008/  2013/  2016  . History of basal cell carcinoma excision    2015  nose  . History of urinary retention    10-30-2014  POST OP  . Hypothyroidism   . Seasonal allergies   . Unspecified essential hypertension     Family History  Problem Relation Age of Onset  . Breast cancer Mother   . Hypertension Mother   . Depression Mother   . Cancer - Other Mother        unknown GI cancer  . Colon polyps Father   . Stroke Father   .  Alzheimer's disease Father   . Colon cancer Neg Hx   . Rectal cancer Neg Hx   . Stomach cancer Neg Hx   . Liver disease Neg Hx     Past Surgical History:  Procedure Laterality Date  . COLONOSCOPY  last one 02-25-2015  . HYDROCELE EXCISION Right 08/26/2015   Procedure: RIGHT HYDROCELECTOMY ADULT;  Surgeon: Franchot Gallo, MD;  Location: [facility];  Service: Urology;  Laterality: Right;  . INGUINAL HERNIA REPAIR Right 10/29/2014   Procedure: LAPAROSCOPIC REPAIR RECURRENT RIGHT INGUINAL ;  Surgeon: Fanny Skates, MD;  Location: [facility];  Service: General;  Laterality: Right;  . INGUINAL HERNIA REPAIR Right 1984  . INSERTION OF MESH Right 10/29/2014   Procedure: INSERTION OF MESH;  Surgeon: Fanny Skates, MD;  Location: [facility];  Service: General;  Laterality: Right;  . POLYPECTOMY    . TOE OSTEOTOMY Bilateral right 2002/  left 2013   great toe   Social History   Occupational History  . Occupation: self employed  Tobacco Use  . Smoking status: Never Smoker  . Smokeless tobacco: Never Used  Substance and Sexual Activity  . Alcohol use: Yes    Comment: rare  . Drug use: No  . Sexual activity: Not on file     Garald Balding, MD   Note - This record has been created using [employer].  Chart creation errors have been sought, but may not always  have been located. Such creation errors do not reflect on  the standard of medical care.

## 2018-09-08 ENCOUNTER — Ambulatory Visit
Admission: RE | Admit: 2018-09-08 | Discharge: 2018-09-08 | Disposition: A | Discharge status: Home / Self Care | Payer: BLUE CROSS/BLUE SHIELD | Source: Ambulatory Visit | Attending: Orthopaedic Surgery | Admitting: Orthopaedic Surgery

## 2018-09-08 DIAGNOSIS — M25552 Pain in left hip: Secondary | ICD-10-CM

## 2018-09-08 MED ORDER — METHYLPREDNISOLONE ACETATE 40 MG/ML INJ SUSP (RADIOLOG: 120.0000 mg | Freq: Once | INTRAMUSCULAR | Status: DC

## 2018-09-08 MED ORDER — IOPAMIDOL (ISOVUE-M 200) INJECTION 41%: 1.0000 mL | Freq: Once | INTRAMUSCULAR | Status: DC

## 2019-01-09 ENCOUNTER — Telehealth: Payer: Self-pay | Admitting: *Deleted

## 2019-01-09 ENCOUNTER — Other Ambulatory Visit (INDEPENDENT_AMBULATORY_CARE_PROVIDER_SITE_OTHER): Payer: Self-pay | Admitting: Orthopedic Surgery

## 2019-01-09 MED ORDER — METHOCARBAMOL 500 MG PO TABS: 500.0000 mg | ORAL_TABLET | Freq: Every evening | ORAL | 0 refills | Status: DC | PRN

## 2019-02-07 ENCOUNTER — Ambulatory Visit: Payer: BLUE CROSS/BLUE SHIELD | Admitting: Orthopaedic Surgery

## 2019-02-07 ENCOUNTER — Other Ambulatory Visit: Payer: Self-pay

## 2019-02-07 ENCOUNTER — Encounter: Payer: Self-pay | Admitting: Orthopaedic Surgery

## 2019-02-07 VITALS — BP 163/112 | HR 87 | Ht 68.0 in | Wt 220.0 lb

## 2019-02-07 DIAGNOSIS — M25552 Pain in left hip: Secondary | ICD-10-CM

## 2019-02-07 NOTE — Progress Notes (Signed)
Office Visit Note   Patient: Robert Blair           Date of Birth: Jul 23, 1956           MRN: [id number] Visit Date: 02/07/2019              Requested by: [name], [address], [place], [place] [address] PCP: [name], MD   Assessment & Plan: Visit Diagnoses:  1. Pain of left hip joint     Plan: Mr. 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  He has been experiencing left thigh and groin pain.  His pain has been episodic possibly related to activity.  He is not had any specific back pain or numbness or tingling distal to the knee.  He is tried over-the-counter medicines and recently Robaxin.  Presently doing fairly well but just had some concerns and came to the office for further evaluation.  I believe all of his symptoms are related to the arthritis of his hip Long discussion today regarding options over time including hip replacement.  He is not ready yet  Follow-Up Instructions: Return if symptoms worsen or fail to improve.   Orders:  No orders of the defined types were placed in this encounter.  No orders of the defined types were placed in this encounter.     Procedures: No procedures performed   Clinical Data: No additional findings.   Subjective: Chief Complaint  Patient presents with  . Left Hip - Pain  Patient presents today for left hip pain. He was here last in December and received a cortisone injection on 09/08/18. Patient states that his hip is doing okay. He states that this morning he could not walking this morning due to weakness in his left knee. He said that his leg continued to give way, but it has improved since. No pain in his knee.  Films of his pelvis and specifically the left hip were performed in February 2019 demonstrated moderate to marked left hip degenerative changes  HPI  Review of Systems   Objective: Vital Signs: BP (!) 163/112   Pulse 87   Ht 5\' 8"  (1.727 m)   Wt 220 lb (99.8 kg)    BMI 33.45 kg/m   Physical Exam Constitutional:      Appearance: He is well-developed.  Eyes:     Pupils: Pupils are equal, round, and reactive to light.  Pulmonary:     Effort: Pulmonary effort is normal.  Skin:    General: Skin is warm and dry.  Neurological:     Mental Status: He is alert and oriented to person, place, and time.  Psychiatric:        Behavior: Behavior normal.     Ortho Exam significant loss of motion left hip.  Unable to bring the left hip to neutral.  External rotation about 20 degrees.  Does have some thigh atrophy of about 2 cm compared to the right.  No knee pain.  Straight leg raise negative  Specialty Comments:  No specialty comments available.  Imaging: No results found.   PMFS History: Patient Active Problem List   Diagnosis Date Noted  . Pain of left hip joint 02/07/2019  . Recurrent right inguinal hernia 10/29/2014  . Hypertension 01/01/2012  . Hypothyroidism 01/01/2012  . History of adenomatous polyp of colon 01/01/2012   Past Medical History:  Diagnosis Date  . Arthritis   . Cancer ([place]) 2016   Basal cell  .  Complication of anesthesia    URINARY RETENTION  . Depression   . History of adenomatous polyp of colon    2008/  2013/  2016  . History of basal cell carcinoma excision    2015  nose  . History of urinary retention    10-30-2014  POST OP  . Hypothyroidism   . Seasonal allergies   . Unspecified essential hypertension     Family History  Problem Relation Age of Onset  . Breast cancer Mother   . Hypertension Mother   . Depression Mother   . Cancer - Other Mother        unknown GI cancer  . Colon polyps Father   . Stroke Father   . Alzheimer's disease Father   . Colon cancer Neg Hx   . Rectal cancer Neg Hx   . Stomach cancer Neg Hx   . Liver disease Neg Hx     Past Surgical History:  Procedure Laterality Date  . COLONOSCOPY  last one 02-25-2015  . HYDROCELE EXCISION Right 08/26/2015   Procedure: RIGHT  HYDROCELECTOMY ADULT;  Surgeon: Franchot Gallo, MD;  Location: [facility];  Service: Urology;  Laterality: Right;  . INGUINAL HERNIA REPAIR Right 10/29/2014   Procedure: LAPAROSCOPIC REPAIR RECURRENT RIGHT INGUINAL ;  Surgeon: Fanny Skates, MD;  Location: [facility];  Service: General;  Laterality: Right;  . INGUINAL HERNIA REPAIR Right 1984  . INSERTION OF MESH Right 10/29/2014   Procedure: INSERTION OF MESH;  Surgeon: Fanny Skates, MD;  Location: [facility];  Service: General;  Laterality: Right;  . POLYPECTOMY    . TOE OSTEOTOMY Bilateral right 2002/  left 2013   great toe   Social History   Occupational History  . Occupation: self employed  Tobacco Use  . Smoking status: Never Smoker  . Smokeless tobacco: Never Used  Substance and Sexual Activity  . Alcohol use: Yes    Comment: rare  . Drug use: No  . Sexual activity: Not on file

## 2019-05-10 ENCOUNTER — Other Ambulatory Visit: Payer: Self-pay | Admitting: Orthopedic Surgery

## 2019-05-10 ENCOUNTER — Other Ambulatory Visit (INDEPENDENT_AMBULATORY_CARE_PROVIDER_SITE_OTHER): Payer: Self-pay | Admitting: Orthopedic Surgery

## 2019-06-02 ENCOUNTER — Encounter: Payer: Self-pay | Admitting: Orthopaedic Surgery

## 2019-06-02 ENCOUNTER — Ambulatory Visit: Payer: BLUE CROSS/BLUE SHIELD | Admitting: Orthopaedic Surgery

## 2019-06-02 ENCOUNTER — Other Ambulatory Visit: Payer: Self-pay

## 2019-06-02 DIAGNOSIS — M654 Radial styloid tenosynovitis [de Quervain]: Secondary | ICD-10-CM

## 2019-06-02 MED ORDER — METHYLPREDNISOLONE ACETATE 40 MG/ML IJ SUSP
20.0000 mg | INTRAMUSCULAR | Status: AC | PRN
  Administered 2019-06-02: 10:00:00 20 mg

## 2019-06-02 MED ORDER — LIDOCAINE HCL 1 % IJ SOLN
1.0000 mL | INTRAMUSCULAR | Status: AC | PRN
  Administered 2019-06-02: 1 mL

## 2019-06-02 NOTE — Progress Notes (Signed)
Office Visit Note   Patient: Robert Blair           Date of Birth: June 06, 1956           MRN: [id number] Visit Date: 06/02/2019              Requested by: [name], [address], [place],  [place] [address] PCP: [name], MD   Assessment & Plan: Visit Diagnoses:  1. Tendinitis, de Quervain's     Plan: Injected de Quervain's tenosynovitis left wrist last year with good result.  Now has developed similar problem on the right.  Will inject right side and monitor response.  Will also apply de Quervain's splint  Follow-Up Instructions: Return if symptoms worsen or fail to improve.   Orders:  Orders Placed This Encounter  Procedures  . Hand/UE Inj   No orders of the defined types were placed in this encounter.     Procedures: Hand/UE Inj for de Quervain's tenosynovitis on 06/02/2019 10:10 AM Details: 27 G needle, radial approach Medications: 1 mL lidocaine 1 %; 20 mg methylPREDNISolone acetate 40 MG/ML      Clinical Data: No additional findings.   Subjective: Chief Complaint  Patient presents with  . Right Wrist - Pain  Patient presents today for right wrist pain. He was seen in June of last year for left DeQuervain's and states that it has now appeared on the right side. The pain is located laterally. No swelling. The pain happens only with certain movements, or moving his thumb. Patient does not want x-rays. HPI  Review of Systems   Objective: Vital Signs: BP (!) 182/106   Pulse 89   Ht 5\' 7"  (1.702 m)   Wt 220 lb (99.8 kg)   BMI 34.46 kg/m   Physical Exam Constitutional:      Appearance: He is well-developed.  Eyes:     Pupils: Pupils are equal, round, and reactive to light.  Pulmonary:     Effort: Pulmonary effort is normal.  Skin:    General: Skin is warm and dry.  Neurological:     Mental Status: He is alert and oriented to person, place, and time.  Psychiatric:        Behavior: Behavior normal.     Ortho Exam right  wrist with positive tenderness over the first dorsal extensor compartment with a positive Finkelstein's test.  Neurologically intact skin intact no pain at the base of the thumb or in the snuffbox Specialty Comments:  No specialty comments available.  Imaging: No results found.   PMFS History: Patient Active Problem List   Diagnosis Date Noted  . Tendinitis, de Quervain's 06/02/2019  . Pain of left hip joint 02/07/2019  . Recurrent right inguinal hernia 10/29/2014  . Hypertension 01/01/2012  . Hypothyroidism 01/01/2012  . History of adenomatous polyp of colon 01/01/2012   Past Medical History:  Diagnosis Date  . Arthritis   . Cancer ([place]) 2016   Basal cell  . Complication of anesthesia    URINARY RETENTION  . Depression   . History of adenomatous polyp of colon    2008/  2013/  2016  . History of basal cell carcinoma excision    2015  nose  . History of urinary retention    10-30-2014  POST OP  . Hypothyroidism   . Seasonal allergies   . Unspecified essential hypertension     Family History  Problem Relation Age of Onset  . Breast cancer Mother   .  Hypertension Mother   . Depression Mother   . Cancer - Other Mother        unknown GI cancer  . Colon polyps Father   . Stroke Father   . Alzheimer's disease Father   . Colon cancer Neg Hx   . Rectal cancer Neg Hx   . Stomach cancer Neg Hx   . Liver disease Neg Hx     Past Surgical History:  Procedure Laterality Date  . COLONOSCOPY  last one 02-25-2015  . HYDROCELE EXCISION Right 08/26/2015   Procedure: RIGHT HYDROCELECTOMY ADULT;  Surgeon: Franchot Gallo, MD;  Location: [facility];  Service: Urology;  Laterality: Right;  . INGUINAL HERNIA REPAIR Right 10/29/2014   Procedure: LAPAROSCOPIC REPAIR RECURRENT RIGHT INGUINAL ;  Surgeon: Fanny Skates, MD;  Location: [facility];  Service: General;  Laterality: Right;  . INGUINAL HERNIA REPAIR Right 1984  . INSERTION OF MESH Right 10/29/2014   Procedure:  INSERTION OF MESH;  Surgeon: Fanny Skates, MD;  Location: [facility];  Service: General;  Laterality: Right;  . POLYPECTOMY    . TOE OSTEOTOMY Bilateral right 2002/  left 2013   great toe   Social History   Occupational History  . Occupation: self employed  Tobacco Use  . Smoking status: Never Smoker  . Smokeless tobacco: Never Used  Substance and Sexual Activity  . Alcohol use: Yes    Comment: rare  . Drug use: No  . Sexual activity: Not on file

## 2019-07-26 ENCOUNTER — Ambulatory Visit: Payer: BC Managed Care – PPO | Admitting: Orthopaedic Surgery

## 2019-07-26 ENCOUNTER — Encounter: Payer: Self-pay | Admitting: Orthopaedic Surgery

## 2019-07-26 ENCOUNTER — Other Ambulatory Visit: Payer: Self-pay

## 2019-07-26 VITALS — BP 149/87 | HR 67 | Ht 67.0 in | Wt 216.0 lb

## 2019-07-26 DIAGNOSIS — M25552 Pain in left hip: Secondary | ICD-10-CM | POA: Diagnosis not present

## 2019-07-26 NOTE — Progress Notes (Signed)
Office Visit Note   Patient: Robert Blair           Date of Birth: [birth year]           MRN: [id number] Visit Date: 07/26/2019              Requested by: [name], MD [address] [place],  [place] [address] PCP: [name], MD   Assessment & Plan: Visit Diagnoses:  1. Pain of left hip joint     Plan: End-stage osteoarthritis left hip.  Will refer to Dr. Ninfa Linden for total hip replacement.  Mr. Hommerding has reached a point where he has significant compromise of his activities Follow-Up Instructions: Return Will refer to Dr. Ninfa Linden for an anterior approach left total hip replacement.   Orders:  No orders of the defined types were placed in this encounter.  No orders of the defined types were placed in this encounter.     Procedures: No procedures performed   Clinical Data: No additional findings.   Subjective: Chief Complaint  Patient presents with  . Left Hip - Pain, Follow-up  Patient returns to discuss total hip surgery. He is having difficulty going up and down stairs. He also complains of difficulty hitting golf balls. He has pain almost all of the time. Some days are better than others. He has been attending physical therapy and having dry needling, but would like to get a better solution to his pain. He is ready to get this fixed.  He has been taking occasional hydrocodone with some relief.  Prior films in 2019 reveal considerable degenerative change about the left hip.  The left hip was fully covered with narrowing of the superior joint space and subchondral sclerosis on both sides of the joint  HPI  Review of Systems   Objective: Vital Signs: BP (!) 149/87   Pulse 67   Ht 5\' 7"  (1.702 m)   Wt 216 lb (98 kg)   BMI 33.83 kg/m   Physical Exam Constitutional:      Appearance: He is well-developed.  Eyes:     Pupils: Pupils are equal, round, and reactive to light.  Pulmonary:     Effort: Pulmonary effort is normal.  Skin:  General: Skin is warm and dry.  Neurological:     Mental Status: He is alert and oriented to person, place, and time.  Psychiatric:        Behavior: Behavior normal.     Ortho Exam awake alert and oriented x3.  Comfortable sitting.  Has significant loss of motion left hip with internal and external rotation.  I can barely get him to neutral and does walk with an excellent rotated extremity.  Neurologically intact.  Leg lengths appear to be symmetrical  Specialty Comments:  No specialty comments available.  Imaging: No results found.   PMFS History: Patient Active Problem List   Diagnosis Date Noted  . Tendinitis, de Quervain's 06/02/2019  . Pain of left hip joint 02/07/2019  . Recurrent right inguinal hernia 10/29/2014  . Hypertension 01/01/2012  . Hypothyroidism 01/01/2012  . History of adenomatous polyp of colon 01/01/2012   Past Medical History:  Diagnosis Date  . Arthritis   . Cancer ([place]) 2016   Basal cell  . Complication of anesthesia    URINARY RETENTION  . Depression   . History of adenomatous polyp of colon    2008/  2013/  2016  . History of basal cell carcinoma excision    2015  nose  . History of urinary retention    10-30-2014  POST OP  . Hypothyroidism   . Seasonal allergies   . Unspecified essential hypertension     Family History  Problem Relation Age of Onset  . Breast cancer Mother   . Hypertension Mother   . Depression Mother   . Cancer - Other Mother        unknown GI cancer  . Colon polyps Father   . Stroke Father   . Alzheimer's disease Father   . Colon cancer Neg Hx   . Rectal cancer Neg Hx   . Stomach cancer Neg Hx   . Liver disease Neg Hx     Past Surgical History:  Procedure Laterality Date  . COLONOSCOPY  last one 02-25-2015  . HYDROCELE EXCISION Right 08/26/2015   Procedure: RIGHT HYDROCELECTOMY ADULT;  Surgeon: Franchot Gallo, MD;  Location[facility];  Service: Urology;  Laterality: Right;  . INGUINAL  HERNIA REPAIR Right 10/29/2014   Procedure: LAPAROSCOPIC REPAIR RECURRENT RIGHT INGUINAL ;  Surgeon: Fanny Skates, MD;  Location: [facility];  Service: General;  Laterality: Right;  . INGUINAL HERNIA REPAIR Right 1984  . INSERTION OF MESH Right 10/29/2014   Procedure: INSERTION OF MESH;  Surgeon: Fanny Skates, MD;  Location: [facility];  Service: General;  Laterality: Right;  . POLYPECTOMY    . TOE OSTEOTOMY Bilateral right 2002/  left 2013   great toe   Social History   Occupational History  . Occupation: self employed  Tobacco Use  . Smoking status: Never Smoker  . Smokeless tobacco: Never Used  Substance and Sexual Activity  . Alcohol use: Yes    Comment: rare  . Drug use: No  . Sexual activity: Not on file

## 2019-08-07 ENCOUNTER — Ambulatory Visit: Payer: BC Managed Care – PPO | Admitting: Orthopaedic Surgery

## 2019-08-07 ENCOUNTER — Encounter: Payer: Self-pay | Admitting: Orthopaedic Surgery

## 2019-08-07 ENCOUNTER — Ambulatory Visit: Payer: Self-pay

## 2019-08-07 ENCOUNTER — Other Ambulatory Visit: Payer: Self-pay

## 2019-08-07 DIAGNOSIS — M1611 Unilateral primary osteoarthritis, right hip: Secondary | ICD-10-CM | POA: Insufficient documentation

## 2019-08-07 DIAGNOSIS — M25552 Pain in left hip: Secondary | ICD-10-CM | POA: Diagnosis not present

## 2019-08-07 MED ORDER — HYDROCODONE-ACETAMINOPHEN 5-325 MG PO TABS: 1.0000 | ORAL_TABLET | Freq: Four times a day (QID) | ORAL | 0 refills | Status: DC | PRN

## 2019-08-07 NOTE — Progress Notes (Signed)
Office Visit Note   Patient: Robert Blair           Date of Birth: 1956/04/22           MRN: [id number]    Visit Date: 08/07/2019              Requested by: Robert Blair, [address], [place],  [place] [address] PCP: Robert Dy, MD   Assessment & Plan: Visit Diagnoses:  1. Pain of left hip joint   2. Unilateral primary osteoarthritis, right hip     Plan: The patient is definitely interested in hip replacement surgery sometime in January or February of this coming year.  I went over his x-rays in detail.  We talked about hip replacement surgery.  I agree with him proceeding with this given his clinical exam findings and x-ray findings combined with the very conservative treatment.  His pain is detriment affecting his mobility and his quality of life and I do feel that hip replacement surgery could help him significantly.  We talked about what to expect with surgery.  I gave him a handout about it.  I explained his interoperative and postoperative course and we talked about the risk and benefits of surgery.  I gave him our surgery scheduler's card and we will work on getting this on the schedule.  All question concerns were answered and addressed.  Follow-Up Instructions: Return for 2 weeks post-op.   Orders:  Orders Placed This Encounter  Procedures  . XR HIP UNILAT W OR W/O PELVIS 2-3 VIEWS LEFT   Meds ordered this encounter  Medications  . HYDROcodone-acetaminophen (NORCO/VICODIN) 5-325 MG tablet    Sig: Take 1 tablet by mouth every 6 (six) hours as needed for moderate pain.    Dispense:  30 tablet    Refill:  0      Procedures: No procedures performed   Clinical Data: No additional findings.   Subjective: Chief Complaint  Patient presents with  . Left Hip - Pain  The patient is someone who was referred from Dr. Durward Fortes to me to consider direct anterior hip replacement surgery on.  He is a very pleasant 63 year old active individual who has been  dealing with left hip pain that is worsening for well over a year now.  At this point his hip pain is become daily and is detrimentally affecting his mobility, his quality of life and his activities daily living.  He reports groin pain.  He reports a significant limp with stiffness.  He has difficulty getting his shoes and socks on on the left side.  He denies any right hip issues.  He has been getting significantly worse over the past 6 months.  He has noticed decreased strength in the hip.  The pain is global.  He says his range of motion is declined and has had a significantly hard time getting out of a car.  He is already been through physical therapy and has had dry needling as well.  He is worked on activity modification and weight loss as well as taking anti-inflammatories.  HPI  Review of Systems He currently denies any headache, chest pain, shortness of breath, fever, chills, nausea, vomiting  Objective: Vital Signs: There were no vitals taken for this visit.  Physical Exam He is alert and orient x3 and in no acute distress.  He walks with a significant Trendelenburg gait favoring the left side. Ortho Exam His right hip exam is normal with fluid and  full range of motion with no pain in the groin at all.  His left hip has significant limitations with internal and external rotation with severe pain also with attempts of rotation. Specialty Comments:  No specialty comments available.  Imaging: Xr Hip Unilat W Or W/o Pelvis 2-3 Views Left  Result Date: 08/07/2019 A low AP pelvis and lateral left hip shows severe end-stage arthritis of the left hip.  There is complete loss of the superior lateral joint space.  There are large cystic changes in the femoral head and in the acetabulum.  There para-articular osteophytes as well.  This is worsened when compared to films from February 2019.    PMFS History: Patient Active Problem List   Diagnosis Date Noted  . Unilateral primary  osteoarthritis, right hip 08/07/2019  . Tendinitis, de Quervain's 06/02/2019  . Pain of left hip joint 02/07/2019  . Recurrent right inguinal hernia 10/29/2014  . Hypertension 01/01/2012  . Hypothyroidism 01/01/2012  . History of adenomatous polyp of colon 01/01/2012   Past Medical History:  Diagnosis Date  . Arthritis   . Cancer ([place]) 2016   Basal cell  . Complication of anesthesia    URINARY RETENTION  . Depression   . History of adenomatous polyp of colon    2008/  2013/  2016  . History of basal cell carcinoma excision    2015  nose  . History of urinary retention    10-30-2014  POST OP  . Hypothyroidism   . Seasonal allergies   . Unspecified essential hypertension     Family History  Problem Relation Age of Onset  . Breast cancer Mother   . Hypertension Mother   . Depression Mother   . Cancer - Other Mother        unknown GI cancer  . Colon polyps Father   . Stroke Father   . Alzheimer's disease Father   . Colon cancer Neg Hx   . Rectal cancer Neg Hx   . Stomach cancer Neg Hx   . Liver disease Neg Hx     Past Surgical History:  Procedure Laterality Date  . COLONOSCOPY  last one 02-25-2015  . HYDROCELE EXCISION Right 08/26/2015   Procedure: RIGHT HYDROCELECTOMY ADULT;  Surgeon: Franchot Gallo, MD;  [facility];  Service: Urology;  Laterality: Right;  . INGUINAL HERNIA REPAIR Right 10/29/2014   Procedure: LAPAROSCOPIC REPAIR RECURRENT RIGHT INGUINAL ;  Surgeon: Fanny Skates, MD;  Location: [facility];  Service: General;  Laterality: Right;  . INGUINAL HERNIA REPAIR Right 1984  . INSERTION OF MESH Right 10/29/2014   Procedure: INSERTION OF MESH;  Surgeon: Fanny Skates, MD;  Location: [facility];  Service: General;  Laterality: Right;  . POLYPECTOMY    . TOE OSTEOTOMY Bilateral right 2002/  left 2013   great toe   Social History   Occupational History  . Occupation: self employed  Tobacco Use  . Smoking status: Never Smoker  .  Smokeless tobacco: Never Used  Substance and Sexual Activity  . Alcohol use: Yes    Comment: rare  . Drug use: No  . Sexual activity: Not on file

## 2019-08-08 NOTE — Telephone Encounter (Signed)
error 

## 2019-09-04 ENCOUNTER — Other Ambulatory Visit: Payer: Self-pay

## 2019-09-13 NOTE — Patient Instructions (Addendum)
DUE TO COVID-19 ONLY ONE VISITOR IS ALLOWED TO COME WITH YOU AND STAY IN THE WAITING ROOM ONLY DURING PRE OP AND PROCEDURE DAY OF SURGERY. THE 1 VISITOR MAY VISIT WITH YOU AFTER SURGERY IN YOUR PRIVATE ROOM DURING VISITING HOURS ONLY!  YOU NEED TO HAVE A COVID 19 TEST ON: 09/19/2019 @  9:05 AM      , THIS TEST MUST BE DONE BEFORE SURGERY, COME  [place], [place] , [address].  ([place]) ONCE YOUR COVID TEST IS COMPLETED, PLEASE BEGIN THE QUARANTINE INSTRUCTIONS AS OUTLINED IN YOUR HANDOUT.                Robert Blair    Your procedure is scheduled on:  09/22/2019   Report to [facility] Main  Entrance    Report to admitting at: 6:00 AM     Call this number if you have problems the morning of surgery [phone]    Remember:     Dale, NO Christoval.     Take these medicines the morning of surgery with A SIP OF WATER: Amlodipine, Bupropion, Fexofenadine, Isosorbide, Levothyroxine, Metoprolol, Vyvanse, Flonase and Norco as needed.                                 You may not have any metal on your body including hair pins and              piercings  Do not wear jewelry,lotions, powders or perfumes, deodorant             Men may shave face and neck.   Do not bring valuables to the hospital. [place].  Contacts, dentures or bridgework may not be worn into surgery.  Leave suitcase in the car. After surgery it may be brought to your room.     Patients discharged the day of surgery will not be allowed to drive home. IF YOU ARE HAVING SURGERY AND GOING HOME THE SAME DAY, YOU MUST HAVE AN ADULT TO DRIVE YOU HOME AND  BE WITH YOU FOR 24 HOURS. YOU MAY GO HOME BY TAXI OR UBER OR ORTHERWISE, BUT AN ADULT MUST ACCOMPANY YOU HOME AND STAY WITH YOU FOR 24 HOURS.  Name and phone number of your driver:  Special Instructions: N/A               Please read over the following fact sheets you were given: _____________________________________________________________________             NO SOLID FOOD AFTER MIDNIGHT THE NIGHT PRIOR TO SURGERY. NOTHING BY MOUTH EXCEPT CLEAR LIQUIDS UNTIL:5:30 am. PLEASE FINISH ENSURE DRINK PER SURGEON ORDER  WHICH NEEDS TO BE COMPLETED AT: 5:30 AM .   CLEAR LIQUID DIET   Foods Allowed                                                                     Foods Excluded  Coffee and tea, regular and decaf  liquids that you cannot  Plain Jell-O any favor except red or purple                                           see through such as: Fruit ices (not with fruit pulp)                                     milk, soups, orange juice  Iced Popsicles                                    All solid food Carbonated beverages, regular and diet                                    Cranberry, grape and apple juices Sports drinks like Gatorade Lightly seasoned clear broth or consume(fat free) Sugar, honey syrup  Sample Menu Breakfast                                Lunch                                     Supper Cranberry juice                    Beef broth                            Chicken broth Jell-O                                     Grape juice                           Apple juice Coffee or tea                        Jell-O                                      Popsicle                                                Coffee or tea                        Coffee or tea  _____________________________________________________________________   [facility] - Preparing for Surgery Before surgery, you can play an important role.  Because skin is not sterile, your skin needs to be as free of germs as possible.  You can reduce the number of germs on your skin by washing with CHG (chlorahexidine gluconate) soap before surgery.  CHG is an antiseptic cleaner which  kills germs and bonds with  the skin to continue killing germs even after washing. Please DO NOT use if you have an allergy to CHG or antibacterial soaps.  If your skin becomes reddened/irritated stop using the CHG and inform your nurse when you arrive at Short Stay. Do not shave (including legs and underarms) for at least 48 hours prior to the first CHG shower.  You may shave your face/neck. Please follow these instructions carefully:  1.  Shower with CHG Soap the night before surgery and the  morning of Surgery.  2.  If you choose to wash your hair, wash your hair first as usual with your  normal  shampoo.  3.  After you shampoo, rinse your hair and body thoroughly to remove the  shampoo.                           4.  Use CHG as you would any other liquid soap.  You can apply chg directly  to the skin and wash                       Gently with a scrungie or clean washcloth.  5.  Apply the CHG Soap to your body ONLY FROM THE NECK DOWN.   Do not use on face/ open                           Wound or open sores. Avoid contact with eyes, ears mouth and genitals (private parts).                       Wash face,  Genitals (private parts) with your normal soap.             6.  Wash thoroughly, paying special attention to the area where your surgery  will be performed.  7.  Thoroughly rinse your body with warm water from the neck down.  8.  DO NOT shower/wash with your normal soap after using and rinsing off  the CHG Soap.                9.  Pat yourself dry with a clean towel.            10.  Wear clean pajamas.            11.  Place clean sheets on your bed the night of your first shower and do not  sleep with pets. Day of Surgery : Do not apply any lotions/deodorants the morning of surgery.  Please wear clean clothes to the hospital/surgery center.  FAILURE TO FOLLOW THESE INSTRUCTIONS MAY RESULT IN THE CANCELLATION OF YOUR SURGERY PATIENT SIGNATURE_________________________________  NURSE  SIGNATURE__________________________________  ________________________________________________________________________   Robert Blair  An incentive spirometer is a tool that can help keep your lungs clear and active. This tool measures how well you are filling your lungs with each breath. Taking long deep breaths may help reverse or decrease the chance of developing breathing (pulmonary) problems (especially infection) following:  A long period of time when you are unable to move or be active. BEFORE THE PROCEDURE   If the spirometer includes an indicator to show your best effort, your nurse or respiratory therapist will set it to a desired goal.  If possible, sit up straight or lean slightly forward. Try not to slouch.  Hold the incentive spirometer in  an upright position. INSTRUCTIONS FOR USE  1. Sit on the edge of your bed if possible, or sit up as far as you can in bed or on a chair. 2. Hold the incentive spirometer in an upright position. 3. Breathe out normally. 4. Place the mouthpiece in your mouth and seal your lips tightly around it. 5. Breathe in slowly and as deeply as possible, raising the piston or the ball toward the top of the column. 6. Hold your breath for 3-5 seconds or for as long as possible. Allow the piston or ball to fall to the bottom of the column. 7. Remove the mouthpiece from your mouth and breathe out normally. 8. Rest for a few seconds and repeat Steps 1 through 7 at least 10 times every 1-2 hours when you are awake. Take your time and take a few normal breaths between deep breaths. 9. The spirometer may include an indicator to show your best effort. Use the indicator as a goal to work toward during each repetition. 10. After each set of 10 deep breaths, practice coughing to be sure your lungs are clear. If you have an incision (the cut made at the time of surgery), support your incision when coughing by placing a pillow or rolled up towels firmly  against it. Once you are able to get out of bed, walk around indoors and cough well. You may stop using the incentive spirometer when instructed by your caregiver.  RISKS AND COMPLICATIONS  Take your time so you do not get dizzy or light-headed.  If you are in pain, you may need to take or ask for pain medication before doing incentive spirometry. It is harder to take a deep breath if you are having pain. AFTER USE  Rest and breathe slowly and easily.  It can be helpful to keep track of a log of your progress. Your caregiver can provide you with a simple table to help with this. If you are using the spirometer at home, follow these instructions: Robert Blair IF:   You are having difficultly using the spirometer.  You have trouble using the spirometer as often as instructed.  Your pain medication is not giving enough relief while using the spirometer.  You develop fever of 100.5 F (38.1 C) or higher. SEEK IMMEDIATE MEDICAL CARE IF:   You cough up bloody sputum that had not been present before.  You develop fever of 102 F (38.9 C) or greater.  You develop worsening pain at or near the incision site. MAKE SURE YOU:   Understand these instructions.  Will watch your condition.  Will get help right away if you are not doing well or get worse. Document Released: 01/11/2007 Document Revised: 11/23/2011 Document Reviewed: 03/14/2007 [facility] Patient Information 2014 [place], [place].   ________________________________________________________________________

## 2019-09-14 ENCOUNTER — Other Ambulatory Visit: Payer: Self-pay

## 2019-09-14 ENCOUNTER — Encounter (HOSPITAL_COMMUNITY): Payer: Self-pay

## 2019-09-14 ENCOUNTER — Encounter (HOSPITAL_COMMUNITY)
Admission: RE | Admit: 2019-09-14 | Discharge: 2019-09-14 | Disposition: A | Discharge status: Home / Self Care | Payer: BC Managed Care – PPO | Source: Ambulatory Visit | Attending: Orthopaedic Surgery | Admitting: Orthopaedic Surgery

## 2019-09-14 DIAGNOSIS — Z01812 Encounter for preprocedural laboratory examination: Secondary | ICD-10-CM | POA: Insufficient documentation

## 2019-09-14 NOTE — Progress Notes (Signed)
PCP - [name]. LOV 10/20 as per pt. Cardiologist -   Chest x-ray -  EKG -  Stress Test -  ECHO -  Cardiac Cath -   Sleep Study -  CPAP -   Fasting Blood Sugar -  Checks Blood Sugar _____ times a day  Blood Thinner Instructions: Aspirin Instructions: Last Dose:  Anesthesia review:   Patient denies shortness of breath, fever, cough and chest pain at PAT appointment   Patient verbalized understanding of instructions that were given to them at the PAT appointment. Patient was also instructed that they will need to review over the PAT instructions again at home before surgery.

## 2019-09-18 ENCOUNTER — Other Ambulatory Visit: Payer: Self-pay

## 2019-09-18 ENCOUNTER — Encounter (HOSPITAL_COMMUNITY)
Admission: RE | Admit: 2019-09-18 | Discharge: 2019-09-18 | Disposition: A | Discharge status: Home / Self Care | Payer: BC Managed Care – PPO | Source: Ambulatory Visit | Attending: Orthopaedic Surgery | Admitting: Orthopaedic Surgery

## 2019-09-18 DIAGNOSIS — Z01818 Encounter for other preprocedural examination: Secondary | ICD-10-CM | POA: Insufficient documentation

## 2019-09-18 LAB — CBC
HCT: 50.9 % (ref 39.0–52.0)
Hemoglobin: 17.3 g/dL — ABNORMAL HIGH (ref 13.0–17.0)
MCH: 30.1 pg (ref 26.0–34.0)
MCHC: 34 g/dL (ref 30.0–36.0)
MCV: 88.7 fL (ref 80.0–100.0)
Platelets: 250 10*3/uL (ref 150–400)
RBC: 5.74 MIL/uL (ref 4.22–5.81)
RDW: 12.8 % (ref 11.5–15.5)
WBC: 10.2 10*3/uL (ref 4.0–10.5)
nRBC: 0 % (ref 0.0–0.2)

## 2019-09-18 LAB — SURGICAL PCR SCREEN
MRSA, PCR: NEGATIVE
Staphylococcus aureus: NEGATIVE

## 2019-09-18 LAB — BASIC METABOLIC PANEL
Anion gap: 11 (ref 5–15)
BUN: 19 mg/dL (ref 8–23)
CO2: 28 mmol/L (ref 22–32)
Calcium: 9.6 mg/dL (ref 8.9–10.3)
Chloride: 101 mmol/L (ref 98–111)
Creatinine, Ser: 1.11 mg/dL (ref 0.61–1.24)
GFR calc Af Amer: >60 mL/min (ref >60)
GFR calc non Af Amer: >60 mL/min (ref >60)
Glucose, Bld: 117 mg/dL — ABNORMAL HIGH (ref 70–99)
Potassium: 3 mmol/L — ABNORMAL LOW (ref 3.5–5.1)
Sodium: 140 mmol/L (ref 135–145)

## 2019-09-19 ENCOUNTER — Other Ambulatory Visit (HOSPITAL_COMMUNITY)
Admission: RE | Admit: 2019-09-19 | Discharge: 2019-09-19 | Disposition: A | Discharge status: Home / Self Care | Payer: BC Managed Care – PPO | Source: Ambulatory Visit | Attending: Orthopaedic Surgery | Admitting: Orthopaedic Surgery

## 2019-09-19 DIAGNOSIS — Z01812 Encounter for preprocedural laboratory examination: Secondary | ICD-10-CM | POA: Insufficient documentation

## 2019-09-19 DIAGNOSIS — Z20822 Contact with and (suspected) exposure to covid-19: Secondary | ICD-10-CM | POA: Diagnosis not present

## 2019-09-21 ENCOUNTER — Telehealth: Payer: Self-pay | Admitting: *Deleted

## 2019-09-21 LAB — NOVEL CORONAVIRUS, NAA (HOSP ORDER, SEND-OUT TO REF LAB; TAT 18-24 HRS): SARS-CoV-2, NAA: NOT DETECTED

## 2019-09-21 NOTE — Telephone Encounter (Signed)
RNCM spoke with patient prior to surgery to discuss upcoming Left total hip arthroplasty with Dr. Ninfa Linden at [facility] on 09/22/19. He and Dr. Ninfa Linden have discussed the possibility that he may go home same day of surgery depending on how he is doing. Noted that he will receive therapy after surgery and CM will arrange any needed DME.Discussed what to expect after surgery and f/u appointment. Also noted that he will need a FWW, but already has a BSC/3in1. Referral made to [facility] for walker. Anticipate HHPT to be needed. Choice provided and referral made to [facility]. Patient is agreeable to same day discharge and felt he had all questions answered at this time. Will continue to assess for further CM needs.

## 2019-09-21 NOTE — Anesthesia Preprocedure Evaluation (Addendum)
Anesthesia Evaluation  Patient identified by MRN, date of birth, ID band Patient awake  General Assessment Comment:Urinary retention with hernia repair, no other surgeries  Reviewed: Allergy & Precautions, NPO status , Patient's Chart, lab work & pertinent test results, reviewed documented beta blocker date and time   History of Anesthesia Complications (+) history of anesthetic complications  Airway Mallampati: II  TM Distance: >3 FB Neck ROM: Full    Dental no notable dental hx. (+) Teeth Intact, Dental Advisory Given   Pulmonary neg pulmonary ROS,    Pulmonary exam normal breath sounds clear to auscultation       Cardiovascular hypertension, Pt. on medications and Pt. on home beta blockers Normal cardiovascular exam Rhythm:Regular Rate:Normal     Neuro/Psych PSYCHIATRIC DISORDERS Depression negative neurological ROS     GI/Hepatic negative GI ROS, Neg liver ROS,   Endo/Other  Hypothyroidism Obesity BMI 35  Renal/GU negative Renal ROS  negative genitourinary   Musculoskeletal  (+) Arthritis , Osteoarthritis,    Abdominal (+) + obese,   Peds negative pediatric ROS (+)  Hematology negative hematology ROS (+) plt 299   Anesthesia Other Findings   Reproductive/Obstetrics negative OB ROS                            Anesthesia Physical  Anesthesia Plan  ASA: III  Anesthesia Plan: MAC and Spinal   Post-op Pain Management:    Induction:   PONV Risk Score and Plan: 2 and Propofol infusion and TIVA  Airway Management Planned: Simple Face Mask and Natural Airway  Additional Equipment: None  Intra-op Plan:   Post-operative Plan:   Informed Consent: I have reviewed the patients History and Physical, chart, labs and discussed the procedure including the risks, benefits and alternatives for the proposed anesthesia with the patient or authorized representative who has indicated his/her  understanding and acceptance.       Plan Discussed with: CRNA  Anesthesia Plan Comments:         Anesthesia Quick Evaluation

## 2019-09-22 ENCOUNTER — Encounter (HOSPITAL_COMMUNITY): Payer: Self-pay | Admitting: Orthopaedic Surgery

## 2019-09-22 ENCOUNTER — Ambulatory Visit (HOSPITAL_COMMUNITY): Payer: BC Managed Care – PPO | Admitting: Physician Assistant

## 2019-09-22 ENCOUNTER — Ambulatory Visit (HOSPITAL_COMMUNITY): Payer: BC Managed Care – PPO | Admitting: Anesthesiology

## 2019-09-22 ENCOUNTER — Encounter (HOSPITAL_COMMUNITY)
Admission: RE | Disposition: A | Discharge status: Home / Self Care | Payer: Self-pay | Source: Other Acute Inpatient Hospital | Attending: Orthopaedic Surgery

## 2019-09-22 ENCOUNTER — Ambulatory Visit (HOSPITAL_COMMUNITY): Payer: BC Managed Care – PPO

## 2019-09-22 ENCOUNTER — Ambulatory Visit (HOSPITAL_COMMUNITY)
Admission: RE | Admit: 2019-09-22 | Discharge: 2019-09-22 | Disposition: A | Discharge status: Home / Self Care | Payer: BC Managed Care – PPO | Source: Other Acute Inpatient Hospital | Attending: Orthopaedic Surgery | Admitting: Orthopaedic Surgery

## 2019-09-22 ENCOUNTER — Other Ambulatory Visit: Payer: Self-pay | Admitting: Orthopaedic Surgery

## 2019-09-22 DIAGNOSIS — M8568 Other cyst of bone, other site: Secondary | ICD-10-CM | POA: Diagnosis not present

## 2019-09-22 DIAGNOSIS — Z419 Encounter for procedure for purposes other than remedying health state, unspecified: Secondary | ICD-10-CM

## 2019-09-22 DIAGNOSIS — E669 Obesity, unspecified: Secondary | ICD-10-CM | POA: Diagnosis not present

## 2019-09-22 DIAGNOSIS — Z96642 Presence of left artificial hip joint: Secondary | ICD-10-CM

## 2019-09-22 DIAGNOSIS — E039 Hypothyroidism, unspecified: Secondary | ICD-10-CM | POA: Diagnosis not present

## 2019-09-22 DIAGNOSIS — J302 Other seasonal allergic rhinitis: Secondary | ICD-10-CM | POA: Insufficient documentation

## 2019-09-22 DIAGNOSIS — M24852 Other specific joint derangements of left hip, not elsewhere classified: Secondary | ICD-10-CM | POA: Insufficient documentation

## 2019-09-22 DIAGNOSIS — M1612 Unilateral primary osteoarthritis, left hip: Secondary | ICD-10-CM

## 2019-09-22 DIAGNOSIS — F329 Major depressive disorder, single episode, unspecified: Secondary | ICD-10-CM | POA: Insufficient documentation

## 2019-09-22 DIAGNOSIS — M25752 Osteophyte, left hip: Secondary | ICD-10-CM | POA: Insufficient documentation

## 2019-09-22 DIAGNOSIS — Z6834 Body mass index (BMI) 34.0-34.9, adult: Secondary | ICD-10-CM | POA: Diagnosis not present

## 2019-09-22 DIAGNOSIS — I10 Essential (primary) hypertension: Secondary | ICD-10-CM | POA: Insufficient documentation

## 2019-09-22 HISTORY — PX: TOTAL HIP ARTHROPLASTY: SHX124

## 2019-09-22 SURGERY — ARTHROPLASTY, HIP, TOTAL, ANTERIOR APPROACH
Anesthesia: Monitor Anesthesia Care | Site: Hip | Laterality: Left

## 2019-09-22 MED ORDER — KETOROLAC TROMETHAMINE 30 MG/ML IJ SOLN
30.0000 mg | Freq: Once | INTRAMUSCULAR | Status: AC | PRN
  Administered 2019-09-22: 30 mg via INTRAVENOUS

## 2019-09-22 MED ORDER — POVIDONE-IODINE 10 % EX SWAB: 2.0000 "application " | Freq: Once | CUTANEOUS | Status: DC

## 2019-09-22 MED ORDER — PROPOFOL 500 MG/50ML IV EMUL
INTRAVENOUS | Status: AC
  Filled 2019-09-22: qty 150

## 2019-09-22 MED ORDER — LIDOCAINE 2% (20 MG/ML) 5 ML SYRINGE
INTRAMUSCULAR | Status: DC | PRN
  Administered 2019-09-22: 40 mg via INTRAVENOUS

## 2019-09-22 MED ORDER — LACTATED RINGERS IV SOLN: INTRAVENOUS | Status: DC

## 2019-09-22 MED ORDER — KETOROLAC TROMETHAMINE 30 MG/ML IJ SOLN
INTRAMUSCULAR | Status: AC
  Filled 2019-09-22: qty 1

## 2019-09-22 MED ORDER — PHENYLEPHRINE HCL (PRESSORS) 10 MG/ML IV SOLN
INTRAVENOUS | Status: AC
  Filled 2019-09-22: qty 1

## 2019-09-22 MED ORDER — LACTATED RINGERS IV BOLUS
500.0000 mL | Freq: Once | INTRAVENOUS | Status: AC
  Administered 2019-09-22: 500 mL via INTRAVENOUS

## 2019-09-22 MED ORDER — SODIUM CHLORIDE 0.9 % IR SOLN
Status: DC | PRN
  Administered 2019-09-22: 1000 mL

## 2019-09-22 MED ORDER — CEFAZOLIN SODIUM-DEXTROSE 1-4 GM/50ML-% IV SOLN
1.0000 g | Freq: Four times a day (QID) | INTRAVENOUS | Status: DC
Start: 2019-09-22 — End: 2019-09-22
  Administered 2019-09-22: 15:00:00 1 g via INTRAVENOUS

## 2019-09-22 MED ORDER — LIDOCAINE 2% (20 MG/ML) 5 ML SYRINGE
INTRAMUSCULAR | Status: AC
  Filled 2019-09-22: qty 5

## 2019-09-22 MED ORDER — PHENYLEPHRINE 40 MCG/ML (10ML) SYRINGE FOR IV PUSH (FOR BLOOD PRESSURE SUPPORT)
PREFILLED_SYRINGE | INTRAVENOUS | Status: DC | PRN
  Administered 2019-09-22: 120 ug via INTRAVENOUS

## 2019-09-22 MED ORDER — BUPIVACAINE HCL (PF) 0.25 % IJ SOLN
INTRAMUSCULAR | Status: DC | PRN
Start: 2019-09-22 — End: 2019-09-22
  Administered 2019-09-22: 30 mL via INTRA_ARTICULAR

## 2019-09-22 MED ORDER — HYDROMORPHONE HCL 1 MG/ML IJ SOLN
0.2500 mg | INTRAMUSCULAR | Status: DC | PRN
  Administered 2019-09-22 (×4): 0.5 mg via INTRAVENOUS

## 2019-09-22 MED ORDER — PHENYLEPHRINE 40 MCG/ML (10ML) SYRINGE FOR IV PUSH (FOR BLOOD PRESSURE SUPPORT)
PREFILLED_SYRINGE | INTRAVENOUS | Status: AC
  Filled 2019-09-22: qty 10

## 2019-09-22 MED ORDER — ONDANSETRON HCL 4 MG/2ML IJ SOLN
INTRAMUSCULAR | Status: DC | PRN
  Administered 2019-09-22: 4 mg via INTRAVENOUS

## 2019-09-22 MED ORDER — MIDAZOLAM HCL 2 MG/2ML IJ SOLN
INTRAMUSCULAR | Status: AC
  Filled 2019-09-22: qty 2

## 2019-09-22 MED ORDER — OXYCODONE HCL 5 MG PO TABS
ORAL_TABLET | ORAL | Status: AC
  Filled 2019-09-22: qty 1

## 2019-09-22 MED ORDER — PROPOFOL 10 MG/ML IV BOLUS
INTRAVENOUS | Status: AC
  Filled 2019-09-22: qty 20

## 2019-09-22 MED ORDER — DIPHENHYDRAMINE HCL 50 MG/ML IJ SOLN
INTRAMUSCULAR | Status: AC
  Filled 2019-09-22: qty 1

## 2019-09-22 MED ORDER — DEXAMETHASONE SODIUM PHOSPHATE 10 MG/ML IJ SOLN
INTRAMUSCULAR | Status: DC | PRN
  Administered 2019-09-22: 10 mg via INTRAVENOUS

## 2019-09-22 MED ORDER — CEFAZOLIN SODIUM-DEXTROSE 2-4 GM/100ML-% IV SOLN
2.0000 g | INTRAVENOUS | Status: AC
  Administered 2019-09-22: 2 g via INTRAVENOUS
  Filled 2019-09-22: qty 100

## 2019-09-22 MED ORDER — MEPERIDINE HCL 50 MG/ML IJ SOLN: 6.2500 mg | INTRAMUSCULAR | Status: DC | PRN

## 2019-09-22 MED ORDER — PROPOFOL 500 MG/50ML IV EMUL
INTRAVENOUS | Status: AC
  Filled 2019-09-22: qty 100

## 2019-09-22 MED ORDER — OXYCODONE HCL 5 MG/5ML PO SOLN: 5.0000 mg | Freq: Once | ORAL | Status: AC | PRN

## 2019-09-22 MED ORDER — ALBUMIN HUMAN 5 % IV SOLN: INTRAVENOUS | Status: DC | PRN

## 2019-09-22 MED ORDER — BUPIVACAINE HCL (PF) 0.25 % IJ SOLN
INTRAMUSCULAR | Status: AC
  Filled 2019-09-22: qty 30

## 2019-09-22 MED ORDER — OXYCODONE HCL 5 MG PO TABS
5.0000 mg | ORAL_TABLET | Freq: Once | ORAL | Status: AC | PRN
  Administered 2019-09-22: 5 mg via ORAL

## 2019-09-22 MED ORDER — MEPIVACAINE HCL (PF) 2 % IJ SOLN
INTRAMUSCULAR | Status: DC | PRN
  Administered 2019-09-22: 3 mL via EPIDURAL

## 2019-09-22 MED ORDER — PROPOFOL 500 MG/50ML IV EMUL
INTRAVENOUS | Status: DC | PRN
  Administered 2019-09-22: 30 ug/kg/min via INTRAVENOUS

## 2019-09-22 MED ORDER — HYDROMORPHONE HCL 1 MG/ML IJ SOLN
INTRAMUSCULAR | Status: AC
  Filled 2019-09-22: qty 2

## 2019-09-22 MED ORDER — PROPOFOL 10 MG/ML IV BOLUS
INTRAVENOUS | Status: DC | PRN
  Administered 2019-09-22: 30 mg via INTRAVENOUS

## 2019-09-22 MED ORDER — ONDANSETRON HCL 4 MG/2ML IJ SOLN
INTRAMUSCULAR | Status: AC
  Filled 2019-09-22: qty 2

## 2019-09-22 MED ORDER — EPHEDRINE 5 MG/ML INJ
INTRAVENOUS | Status: AC
  Filled 2019-09-22: qty 10

## 2019-09-22 MED ORDER — FENTANYL CITRATE (PF) 100 MCG/2ML IJ SOLN
50.0000 ug | INTRAMUSCULAR | Status: DC | PRN
  Administered 2019-09-22: 50 ug via INTRAVENOUS

## 2019-09-22 MED ORDER — TIZANIDINE HCL 4 MG PO TABS: 4.0000 mg | ORAL_TABLET | Freq: Three times a day (TID) | ORAL | 1 refills | Status: DC | PRN

## 2019-09-22 MED ORDER — ASPIRIN 81 MG PO TBEC: 81.0000 mg | DELAYED_RELEASE_TABLET | Freq: Two times a day (BID) | ORAL | 0 refills | Status: DC

## 2019-09-22 MED ORDER — STERILE WATER FOR IRRIGATION IR SOLN
Status: DC | PRN
Start: 2019-09-22 — End: 2019-09-22
  Administered 2019-09-22: 2000 mL

## 2019-09-22 MED ORDER — LACTATED RINGERS IV BOLUS
250.0000 mL | Freq: Once | INTRAVENOUS | Status: AC
  Administered 2019-09-22: 15:00:00 250 mL via INTRAVENOUS

## 2019-09-22 MED ORDER — DIPHENHYDRAMINE HCL 50 MG/ML IJ SOLN
INTRAMUSCULAR | Status: DC | PRN
  Administered 2019-09-22: 25 mg via INTRAVENOUS

## 2019-09-22 MED ORDER — FENTANYL CITRATE (PF) 100 MCG/2ML IJ SOLN
INTRAMUSCULAR | Status: AC
  Filled 2019-09-22: qty 2

## 2019-09-22 MED ORDER — OXYCODONE HCL 5 MG PO TABS: 5.0000 mg | ORAL_TABLET | ORAL | 0 refills | Status: DC | PRN

## 2019-09-22 MED ORDER — ACETAMINOPHEN 500 MG PO TABS
1000.0000 mg | ORAL_TABLET | Freq: Once | ORAL | Status: AC
  Administered 2019-09-22: 1000 mg via ORAL
  Filled 2019-09-22: qty 2

## 2019-09-22 MED ORDER — MIDAZOLAM HCL 5 MG/5ML IJ SOLN
INTRAMUSCULAR | Status: DC | PRN
  Administered 2019-09-22: 2 mg via INTRAVENOUS

## 2019-09-22 MED ORDER — PROMETHAZINE HCL 25 MG/ML IJ SOLN: 6.2500 mg | INTRAMUSCULAR | Status: DC | PRN

## 2019-09-22 MED ORDER — CEFAZOLIN SODIUM-DEXTROSE 1-4 GM/50ML-% IV SOLN
INTRAVENOUS | Status: AC
  Filled 2019-09-22: qty 50

## 2019-09-22 MED ORDER — SODIUM CHLORIDE 0.9 % IV SOLN
INTRAVENOUS | Status: DC | PRN
  Administered 2019-09-22: 40 ug/min via INTRAVENOUS

## 2019-09-22 MED ORDER — SUCCINYLCHOLINE CHLORIDE 200 MG/10ML IV SOSY
PREFILLED_SYRINGE | INTRAVENOUS | Status: AC
  Filled 2019-09-22: qty 10

## 2019-09-22 MED ORDER — DEXAMETHASONE SODIUM PHOSPHATE 10 MG/ML IJ SOLN
INTRAMUSCULAR | Status: AC
  Filled 2019-09-22: qty 1

## 2019-09-22 MED ORDER — 0.9 % SODIUM CHLORIDE (POUR BTL) OPTIME
TOPICAL | Status: DC | PRN
  Administered 2019-09-22: 1000 mL

## 2019-09-22 SURGICAL SUPPLY — 41 items
ACETAB CUP W/GRIPTION 54 (Plate) ×2 IMPLANT
BAG ZIPLOCK 12X15 (MISCELLANEOUS) IMPLANT
BENZOIN TINCTURE PRP APPL 2/3 (GAUZE/BANDAGES/DRESSINGS) IMPLANT
BLADE SAW SGTL 18X1.27X75 (BLADE) ×2 IMPLANT
COVER PERINEAL POST (MISCELLANEOUS) ×2 IMPLANT
COVER SURGICAL LIGHT HANDLE (MISCELLANEOUS) ×2 IMPLANT
COVER WAND RF STERILE (DRAPES) ×2 IMPLANT
CUP ACETAB W/GRIPTION 54 (Plate) ×1 IMPLANT
DRAPE STERI IOBAN 125X83 (DRAPES) ×2 IMPLANT
DRAPE U-SHAPE 47X51 STRL (DRAPES) ×4 IMPLANT
DRSG AQUACEL AG ADV 3.5X10 (GAUZE/BANDAGES/DRESSINGS) ×2 IMPLANT
DURAPREP 26ML APPLICATOR (WOUND CARE) ×2 IMPLANT
ELECT REM PT RETURN 15FT ADLT (MISCELLANEOUS) ×2 IMPLANT
GAUZE XEROFORM 1X8 LF (GAUZE/BANDAGES/DRESSINGS) ×2 IMPLANT
GLOVE BIO SURGEON STRL SZ7.5 (GLOVE) ×2 IMPLANT
GLOVE BIOGEL PI IND STRL 8 (GLOVE) ×2 IMPLANT
GLOVE BIOGEL PI INDICATOR 8 (GLOVE) ×2
GLOVE ECLIPSE 8.0 STRL XLNG CF (GLOVE) ×2 IMPLANT
GOWN STRL REUS W/TWL XL LVL3 (GOWN DISPOSABLE) ×4 IMPLANT
HANDPIECE INTERPULSE COAX TIP (DISPOSABLE) ×1
HEAD CERAMIC 36 PLUS 8.5 12 14 (Hips) ×2 IMPLANT
HEAD CERAMIC 36 PLUS5 (Hips) ×2 IMPLANT
HOLDER FOLEY CATH W/STRAP (MISCELLANEOUS) ×2 IMPLANT
KIT TURNOVER KIT A (KITS) IMPLANT
LINER NEUTRAL 36ID 54OD (Liner) ×2 IMPLANT
PACK ANTERIOR HIP CUSTOM (KITS) ×2 IMPLANT
PENCIL SMOKE EVACUATOR (MISCELLANEOUS) ×2 IMPLANT
SET HNDPC FAN SPRY TIP SCT (DISPOSABLE) ×1 IMPLANT
SLEEVE SUCTION 125 (MISCELLANEOUS) ×2 IMPLANT
STAPLER VISISTAT 35W (STAPLE) IMPLANT
STEM FEMORAL SZ6 HIGH ACTIS (Stem) ×2 IMPLANT
STRIP CLOSURE SKIN 1/2X4 (GAUZE/BANDAGES/DRESSINGS) IMPLANT
SUT ETHIBOND NAB CT1 #1 30IN (SUTURE) ×2 IMPLANT
SUT ETHILON 2 0 PS N (SUTURE) IMPLANT
SUT MNCRL AB 4-0 PS2 18 (SUTURE) IMPLANT
SUT VIC AB 0 CT1 36 (SUTURE) ×2 IMPLANT
SUT VIC AB 1 CT1 36 (SUTURE) ×2 IMPLANT
SUT VIC AB 2-0 CT1 27 (SUTURE) ×2
SUT VIC AB 2-0 CT1 TAPERPNT 27 (SUTURE) ×2 IMPLANT
TRAY FOLEY MTR SLVR 16FR STAT (SET/KITS/TRAYS/PACK) ×2 IMPLANT
YANKAUER SUCT BULB TIP 10FT TU (MISCELLANEOUS) ×2 IMPLANT

## 2019-09-22 NOTE — Transfer of Care (Signed)
Immediate Anesthesia Transfer of Care Note  Patient: Robert Blair  Procedure(s) Performed: LEFT TOTAL HIP ARTHROPLASTY ANTERIOR APPROACH (Left Hip)  Patient Location: PACU  Anesthesia Type:Spinal  Level of Consciousness: drowsy, patient cooperative and responds to stimulation  Airway & Oxygen Therapy: Patient Spontanous Breathing and Patient connected to face mask oxygen  Post-op Assessment: Report given to RN and Post -op Vital signs reviewed and stable  Post vital signs: Reviewed and stable  Last Vitals:  Vitals Value Taken Time  BP 112/93 09/22/19 1038  Temp 36.7 C 09/22/19 1038  Pulse 64 09/22/19 1040  Resp 21 09/22/19 1040  SpO2 100 % 09/22/19 1040  Vitals shown include unvalidated device data.  Last Pain:  Vitals:   09/22/19 0700  TempSrc:   PainSc: 0-No pain      Patients Stated Pain Goal: 3 ([id number])  Complications: No apparent anesthesia complications

## 2019-09-22 NOTE — Brief Op Note (Signed)
09/22/2019  10:14 AM  PATIENT:  Tillman Abide  64 y.o. male  PRE-OPERATIVE DIAGNOSIS:  osteoarthritis left hip  POST-OPERATIVE DIAGNOSIS:  osteoarthritis left hip  PROCEDURE:  Procedure(s): LEFT TOTAL HIP ARTHROPLASTY ANTERIOR APPROACH (Left)  SURGEON:  Surgeon(s) and Role:    Mcarthur Rossetti, MD - Primary  PHYSICIAN ASSISTANT:  Benita Stabile, PA-C  ANESTHESIA:   local and spinal  EBL:  150 mL   COUNTS:  YES  DICTATION: .Other Dictation: Dictation Number [id number]  PLAN OF CARE: Discharge to home after PACU  PATIENT DISPOSITION:  PACU - hemodynamically stable.   Delay start of Pharmacological VTE agent (>24hrs) due to surgical blood loss or risk of bleeding: no

## 2019-09-22 NOTE — Op Note (Signed)
NAME: Robert Blair, Robert Blair MEDICAL RECORD [id number] ACCOUNT [id number] DATE OF BIRTH:11/22/1955 FACILITY: [facility] LOCATION: [facility] PHYSICIAN:Marypat Kimmet Kerry Fort, MD  OPERATIVE REPORT  DATE OF PROCEDURE:  09/22/2019  PREOPERATIVE DIAGNOSIS:  Primary osteoarthritis and degenerative joint disease, left hip.  POSTOPERATIVE DIAGNOSIS:  Primary osteoarthritis and degenerative joint disease, left hip.  PROCEDURE:  Left total hip arthroplasty through direct anterior approach.  IMPLANTS:  DePuy Sector Gription acetabular component size 54, size 36+0 neutral polyethylene liner, size 6 Actis femoral component with high offset, size 36+8.5 ceramic hip ball.  SURGEON:  Lind Guest. Ninfa Linden, MD  ASSISTANT:  Erskine Emery, PA-C  ANESTHESIA:   1.  Spinal.   2.  Local with 0.25% plain Marcaine.  ESTIMATED BLOOD LOSS:  150 mL.  ANTIBIOTICS:  Two grams IV Ancef.  COMPLICATIONS:  None.  INDICATIONS:  The patient is a 64 year old gentleman with well-documented debilitating arthritis involving his left hip.  At this point, his pain is daily and is detrimentally affecting his mobility his activities of daily living, his quality of life to  the point he does wish to proceed with total hip arthroplasty.  We had a long and thorough discussion about this type of surgery.  We talked in detail about the risk of acute blood loss anemia, nerve or vessel injury, fracture, infection, dislocation,  DVT and implant failure.  I talked about our goals being hopefully to decrease pain, improve mobility and overall improve quality of life.  DESCRIPTION OF PROCEDURE:  After informed consent was obtained appropriate left hip was marked.  He was brought to the operating room and sat up on a stretcher.  Spinal anesthesia was obtained.  He was then laid in supine position on a stretcher.  Foley  catheter was placed and both feet had traction boots applied to them.  Next, he was placed supine on the Hana fracture  table, the perineal post in place and both legs in line skeletal traction device and no traction applied.  His left operative hip was  prepped and draped with DuraPrep and sterile drapes.  A time-out was called.  He was identified, correct procedure, patient and correct left hip.  I then made an incision just inferior and posterior to the anterior iliac spine and carried this obliquely  down the leg.  We dissected down tensor fascia lata muscle.  Tensor fascia was then divided longitudinally to proceed with direct anterior approach to the hip.  We identified and cauterized circumflex vessels.  I then identified the hip capsule, opened  the hip capsule in an L-type format finding a moderate joint effusion and significant periarticular osteophytes around the lateral femoral head and neck.  I placed Cobra retractors within the hip capsule around the medial and lateral femoral neck and  made our femoral neck cut with an oscillating saw just proximal to the lesser trochanter and completed this with an osteotome and placed a corkscrew guide in the femoral head and removed the femoral heads entirety and found a wide area devoid of  cartilage.  We then placed a bent Hohmann over the medial acetabular rim and removed remnants of the acetabular labrum and other debris from the hip socket.  I then began reaming under direct visualization from a size 44 reamer and then we skipped  several intervals went to a size 50 and then a 53 reamer.  The last reamer was placed under direct visualization and direct fluoroscopy, so I could obtain my depth of reaming my inclination and anteversion.  I  then placed the real DePuy Sector Gription  acetabular component size 54 and a 36+0 neutral polyethylene liner.  Femoral neck cut with his offset appropriately as well.  Attention was then turned to the femur.  With the leg externally rotated to 120 degrees, extended and adducted, I was able to  place a Mueller retractor medially and  Hohman retractor behind the greater trochanter, released lateral joint capsule and used a box-cutting osteotome to enter the femoral canal and a rongeur to lateralize then began broaching using the Actis broaching  system from a size zero going up to a size 6.  With a size 6 in place, we trialed a standard offset femoral neck and a 36+1.5 hip ball, reduced this in the acetabulum and I felt like we just needed a little more offset and leg length.  We dislocated the  hip and removed the trial components.  I then placed the real Actis femoral component with high offset size 6 and the real 36+5 ceramic hip ball, reduced this in the acetabulum, and I just was not completely pleased with the offset and leg length and it  just had a little bit of play in it, more than I was comfortable with this, so I dislocated the hip and removed that real 36+5 hip ball and I went up to 36+8.5 ceramic hip ball.  I reduced this in the acetabulum and I was very pleased with leg length,  offset, range of motion and stability assessed.  I assessed it significantly mechanically and radiographically.  We then irrigated the soft tissue with normal saline solution using pulsatile lavage.  I was able to reapproximate the joint capsule with  interrupted #1 Ethibond suture.  I then placed some Marcaine around the joint capsule itself.  We then closed the tensor fascia with #1 Vicryl followed by 0 Vicryl to close deep tissue, 2-0 Vicryl to close subcutaneous tissue and interrupted staples on  the skin for closure of the skin.  Xeroform and Aquacel dressing was applied.  The Foley catheter was removed.  He was taken to recovery room in stable condition.  All final counts were correct.  There were no complications noted.  Of note, Benita Stabile,  PA-C, assisted during the entire case.  His assistance was crucial for facilitating all aspects of this case.  CN/NUANCE  D:09/22/2019 T:09/22/2019 JOB:[id number]

## 2019-09-22 NOTE — Care Plan (Signed)
RNCM spoke with patient prior to surgery. He is currently scheduled today for a Left total hip arthroplasty by Dr. Ninfa Linden. It is anticipated that patient will be a same day discharge depending on how he does after surgery and with therapy. CM in office has arranged all DME needed (FWW) and HHPT. No other CM needs at this time. Please contact Jamse Arn, RN, BSN, CCM for questions. [phone].

## 2019-09-22 NOTE — Evaluation (Signed)
Physical Therapy Evaluation Patient Details Name: Robert Blair MRN: [id number] DOB: 1956-08-03 Today's Date: 09/22/2019   History of Present Illness  Patient is 64 y.o. male s/p Lt THA anterior approach with PMH significant for OA and hypothyroidism.  Clinical Impression  BIJON MINEER is a 64 y.o. male POD 0 s/p Lt THA. Patient reports independence with mobility at baseline. Patient is now limited by functional impairments (see PT problem list below) and requires supervision for transfers and gait with RW. Patient was able to ambulate ~140 feet with RW and supervision and cues for safe walker management. Patient educated on safe sequencing for stair mobility and verbalized safe guarding position for people assisting with mobility. Patient instructed in exercises to facilitate ROM and circulation. Patient will benefit from continued skilled PT interventions to address impairments and progress towards PLOF. Patient has met mobility goals at adequate level for discharge home; will continue to follow if pt continues acute stay to progress towards Mod I goals.     Follow Up Recommendations Follow surgeon's recommendation for DC plan and follow-up therapies    Equipment Recommendations  Rolling walker with 5" wheels    Recommendations for Other Services       Precautions / Restrictions Precautions Precautions: Fall Restrictions Weight Bearing Restrictions: No      Mobility  Bed Mobility Overal bed mobility: Needs Assistance Bed Mobility: Supine to Sit;Sit to Supine     Supine to sit: Supervision;HOB elevated Sit to supine: Supervision;HOB elevated   General bed mobility comments: no assist required, educated on how to use gait belt to assist Lt LE mobility  Transfers Overall transfer level: Needs assistance Equipment used: Rolling walker (2 wheeled) Transfers: Sit to/from Stand Sit to Stand: Supervision         General transfer comment: cues for safe hand placement and  technique with RW; no assist required for power up, cues to reach back for safety when sitting  Ambulation/Gait Ambulation/Gait assistance: Supervision Gait Distance (Feet): 140 Feet Assistive device: Rolling walker (2 wheeled) Gait Pattern/deviations: Decreased stride length;Decreased stance time - left;Decreased step length - right;Step-through pattern Gait velocity: decreased   General Gait Details: cues for safe hand placement and technique with RW, pt maintained safe position to walker throughout  Stairs Stairs: Yes Stairs assistance: Supervision Stair Management: One rail Left;Step to pattern;With cane;Forwards Number of Stairs: 6(2x 3) General stair comments: cues for "up wtih good, down with bad" pt with good sequencing using SPC  Wheelchair Mobility    Modified Rankin (Stroke Patients Only)       Balance Overall balance assessment: Needs assistance Sitting-balance support: Feet supported Sitting balance-Leahy Scale: Good     Standing balance support: During functional activity;Bilateral upper extremity supported Standing balance-Leahy Scale: Fair            Pertinent Vitals/Pain Pain Assessment: 0-10 Pain Score: 2  Pain Location: Lt hip Pain Descriptors / Indicators: Burning Pain Intervention(s): Monitored during session    Home Living Family/patient expects to be discharged to:: Private residence Living Arrangements: Spouse/significant other Available Help at Discharge: Family Type of Home: House Home Access: Stairs to enter Entrance Stairs-Rails: Left Entrance Stairs-Number of Steps: 1 at back door with [place]: Two level;Bed/bath upstairs Home Equipment: Walker - 2 wheels;Cane - single point;Bedside commode      Prior Function Level of Independence: Independent               Hand Dominance   Dominant Hand: Right  Extremity/Trunk Assessment   Upper Extremity Assessment Upper Extremity Assessment: Overall WFL for tasks  assessed    Lower Extremity Assessment Lower Extremity Assessment: LLE deficits/detail LLE Deficits / Details: good quad acitvation and abel to complete LAQ, reports burning and numbness in anterior upper thigh LLE Sensation: WNL LLE Coordination: WNL    Cervical / Trunk Assessment Cervical / Trunk Assessment: Normal  Communication   Communication: No difficulties  Cognition Arousal/Alertness: Awake/alert Behavior During Therapy: WFL for tasks assessed/performed Overall Cognitive Status: Within Functional Limits for tasks assessed             General Comments      Exercises Total Joint Exercises Ankle Circles/Pumps: AROM;10 reps;Supine;Both Quad Sets: AROM;5 reps;Supine;Left Short Arc Quad: AROM;Supine;5 reps;Left Heel Slides: AAROM;5 reps;Supine;Left Hip ABduction/ADduction: AROM;5 reps;Supine;Left Long Arc Quad: AROM;5 reps;Seated;Left Other Exercises Other Exercises: Standing exercises: cues for use of counter for safety, marching, hip extension, hip abduction, knee flexion   Assessment/Plan    PT Assessment Patient needs continued PT services  PT Problem List Decreased strength;Decreased balance;Decreased mobility;Decreased range of motion;Decreased activity tolerance;Decreased knowledge of use of DME       PT Treatment Interventions DME instruction;Functional mobility training;Balance training;Patient/family education;Therapeutic activities;Gait training;Stair training;Therapeutic exercise    PT Goals (Current goals can be found in the Care Plan section)  Acute Rehab PT Goals Patient Stated Goal: get back to tennis and golf PT Goal Formulation: With patient Time For Goal Achievement: 09/29/19 Potential to Achieve Goals: Good    Frequency 7X/week    AM-PAC PT "6 Clicks" Mobility  Outcome Measure Help needed turning from your back to your side while in a flat bed without using bedrails?: A Little Help needed moving from lying on your back to sitting on the  side of a flat bed without using bedrails?: A Little Help needed moving to and from a bed to a chair (including a wheelchair)?: A Little Help needed standing up from a chair using your arms (e.g., wheelchair or bedside chair)?: A Little Help needed to walk in hospital room?: A Little Help needed climbing 3-5 steps with a railing? : A Little 6 Click Score: 18    End of Session Equipment Utilized During Treatment: Gait belt Activity Tolerance: Patient tolerated treatment well Patient left: in bed;with call bell/phone within reach Nurse Communication: Mobility status PT Visit Diagnosis: Muscle weakness (generalized) (M62.81);Difficulty in walking, not elsewhere classified (R26.2)    Time: 9191-6606 PT Time Calculation (min) (ACUTE ONLY): 45 min   Charges:   PT Evaluation $PT Eval Low Complexity: 1 Low PT Treatments $Gait Training: 8-22 mins $Therapeutic Exercise: 8-22 mins       Verner Mould, DPT Physical Therapist with [facility] [phone]  09/22/2019 5:32 PM

## 2019-09-22 NOTE — Anesthesia Postprocedure Evaluation (Signed)
Anesthesia Post Note  Patient: Robert Blair  Procedure(s) Performed: LEFT TOTAL HIP ARTHROPLASTY ANTERIOR APPROACH (Left Hip)     Patient location during evaluation: PACU Anesthesia Type: MAC and Spinal Level of consciousness: oriented and awake and alert Pain management: pain level controlled Vital Signs Assessment: post-procedure vital signs reviewed and stable Respiratory status: spontaneous breathing and respiratory function stable Cardiovascular status: blood pressure returned to baseline and stable Postop Assessment: no headache, no backache, no apparent nausea or vomiting and patient able to bend at knees Anesthetic complications: no    Last Vitals:  Vitals:   09/22/19 1145 09/22/19 1200  BP: 120/79 116/90  Pulse: 63 66  Resp: (!) 8 10  Temp:    SpO2: 92% 95%    Last Pain:  Vitals:   09/22/19 1200  TempSrc:   PainSc: Asleep    LLE Motor Response: Purposeful movement (09/22/19 1200)   RLE Motor Response: Purposeful movement (09/22/19 1200)   L Sensory Level: S1-Sole of foot, small toes (09/22/19 1200) R Sensory Level: S1-Sole of foot, small toes (09/22/19 1200)  Pervis Hocking

## 2019-09-22 NOTE — H&P (Signed)
TOTAL HIP ADMISSION H&P  Patient is admitted for left total hip arthroplasty.  Subjective:  Chief Complaint: left hip pain  HPI: Robert Blair, 64 y.o. male, has a history of pain and functional disability in the left hip(s) due to arthritis and patient has failed non-surgical conservative treatments for greater than 12 weeks to include NSAID's and/or analgesics, corticosteriod injections, weight reduction as appropriate and activity modification.  Onset of symptoms was gradual starting 1 years ago with gradually worsening course since that time.The patient noted no past surgery on the left hip(s).  Patient currently rates pain in the left hip at 10 out of 10 with activity. Patient has night pain, worsening of pain with activity and weight bearing, pain that interfers with activities of daily living, pain with passive range of motion and crepitus. Patient has evidence of subchondral cysts, subchondral sclerosis, periarticular osteophytes and joint space narrowing by imaging studies. This condition presents safety issues increasing the risk of falls.  There is no current active infection.  Patient Active Problem List   Diagnosis Date Noted  . Unilateral primary osteoarthritis, left hip 09/22/2019  . Tendinitis, de Quervain's 06/02/2019  . Pain of left hip joint 02/07/2019  . Recurrent right inguinal hernia 10/29/2014  . Hypertension 01/01/2012  . Hypothyroidism 01/01/2012  . History of adenomatous polyp of colon 01/01/2012   Past Medical History:  Diagnosis Date  . Arthritis   . Cancer ([place]) 2016   Basal cell  . Complication of anesthesia    URINARY RETENTION  . Depression   . History of adenomatous polyp of colon    2008/  2013/  2016  . History of basal cell carcinoma excision    2015  nose  . History of urinary retention    10-30-2014  POST OP  . Hypothyroidism   . Seasonal allergies   . Unspecified essential hypertension     Past Surgical History:  Procedure Laterality  Date  . COLONOSCOPY  last one 02-25-2015  . HYDROCELE EXCISION Right 08/26/2015   Procedure: RIGHT HYDROCELECTOMY ADULT;  Surgeon: Franchot Gallo, MD;  Location: [facility];  Service: Urology;  Laterality: Right;  . INGUINAL HERNIA REPAIR Right 10/29/2014   Procedure: LAPAROSCOPIC REPAIR RECURRENT RIGHT INGUINAL ;  Surgeon: Fanny Skates, MD;  Location: [facility];  Service: General;  Laterality: Right;  . INGUINAL HERNIA REPAIR Right 1984  . INSERTION OF MESH Right 10/29/2014   Procedure: INSERTION OF MESH;  Surgeon: Fanny Skates, MD;  Location: [facility];  Service: General;  Laterality: Right;  . POLYPECTOMY    . TOE OSTEOTOMY Bilateral right 2002/  left 2013   great toe    Current Facility-Administered Medications  Medication Dose Route Frequency Provider Last Rate Last Admin  . acetaminophen (TYLENOL) tablet 1,000 mg  1,000 mg Oral Once Finucane, Elizabeth M, DO      . ceFAZolin (ANCEF) IVPB 2g/100 mL premix  2 g Intravenous On Call to OR Mcarthur Rossetti, MD      . lactated ringers infusion   Intravenous Continuous Effie Berkshire, MD      . povidone-iodine 10 % swab 2 application  2 application Topical Once Mcarthur Rossetti, MD       Allergies  Allergen Reactions  . Chamomile Other (See Comments)    Eye irritation/ red    Social History   Tobacco Use  . Smoking status: Never Smoker  . Smokeless tobacco: Never Used  Substance Use Topics  . Alcohol use:  Yes    Comment: rare    Family History  Problem Relation Age of Onset  . Breast cancer Mother   . Hypertension Mother   . Depression Mother   . Cancer - Other Mother        unknown GI cancer  . Colon polyps Father   . Stroke Father   . Alzheimer's disease Father   . Colon cancer Neg Hx   . Rectal cancer Neg Hx   . Stomach cancer Neg Hx   . Liver disease Neg Hx      Review of Systems  Musculoskeletal: Positive for neck pain.  All other systems reviewed and are  negative.   Objective:  Physical Exam  Constitutional: He is oriented to person, place, and time. He appears well-developed and well-nourished.  HENT:  Head: Normocephalic and atraumatic.  Eyes: Pupils are equal, round, and reactive to light. EOM are normal.  Cardiovascular: Normal rate and regular rhythm.  Respiratory: Effort normal and breath sounds normal.  GI: Soft. Bowel sounds are normal.  Musculoskeletal:     Cervical back: Normal range of motion and neck supple.     Left hip: Tenderness and bony tenderness present. Decreased range of motion. Decreased strength.  Neurological: He is alert and oriented to person, place, and time.  Skin: Skin is warm and dry.  Psychiatric: He has a normal mood and affect.    Vital signs in last 24 hours: Temp:  [99.5 F (37.5 C)] 99.5 F (37.5 C) (01/08 0636) Pulse Rate:  [62] 62 (01/08 0636) Resp:  [18] 18 (01/08 0636) BP: (165)/(105) 165/105 (01/08 0636) SpO2:  [95 %] 95 % (01/08 0636) Weight:  [100.3 kg] 100.3 kg (01/08 0607)  Labs:   Estimated body mass index is 34.64 kg/m as calculated from the following:   Height as of this encounter: 5\' 7"  (1.702 m).   Weight as of this encounter: 100.3 kg.   Imaging Review Plain radiographs demonstrate severe degenerative joint disease of the left hip(s). The bone quality appears to be excellent for age and reported activity level.      Assessment/Plan:  End stage arthritis, left hip(s)  The patient history, physical examination, clinical judgement of the provider and imaging studies are consistent with end stage degenerative joint disease of the left hip(s) and total hip arthroplasty is deemed medically necessary. The treatment options including medical management, injection therapy, arthroscopy and arthroplasty were discussed at length. The risks and benefits of total hip arthroplasty were presented and reviewed. The risks due to aseptic loosening, infection, stiffness,  dislocation/subluxation,  thromboembolic complications and other imponderables were discussed.  The patient acknowledged the explanation, agreed to proceed with the plan and consent was signed. Patient is being admitted for inpatient treatment for surgery, pain control, PT, OT, prophylactic antibiotics, VTE prophylaxis, progressive ambulation and ADL's and discharge planning.The patient is planning to be discharged home with home health services    Patient's anticipated LOS is less than 2 midnights, meeting these requirements: - Younger than 49 - Lives within 1 hour of care - Has a competent adult at home to recover with post-op recover - NO history of  - Chronic pain requiring opiods  - Diabetes  - Coronary Artery Disease  - Heart failure  - Heart attack  - Stroke  - DVT/VTE  - Cardiac arrhythmia  - Respiratory Failure/COPD  - Renal failure  - Anemia  - Advanced Liver disease

## 2019-09-25 ENCOUNTER — Telehealth: Payer: Self-pay

## 2019-09-25 ENCOUNTER — Encounter: Payer: Self-pay | Admitting: *Deleted

## 2019-09-25 NOTE — Telephone Encounter (Signed)
Patient aware of the below message  

## 2019-09-25 NOTE — Telephone Encounter (Signed)
Patient called stating that he has not had a bowel movement since Friday, 09/22/2019.  Patient had left total hip on Friday, 09/22/2019.  Would like a CB at [phone].  Please advise.  Thank you.

## 2019-09-25 NOTE — Telephone Encounter (Signed)
Have the patient and his wife go to the drugstore and get a bottle of mag citrate. He is to drink half of the bottle today and then drink the other half of the bottle tomorrow. Worst-case scenario is we would send in an a suppository to try as well but he is to try the mag citrate first.

## 2019-09-26 ENCOUNTER — Telehealth: Payer: Self-pay | Admitting: Physician Assistant

## 2019-09-26 NOTE — Telephone Encounter (Signed)
Patient called. He would like a RX for oxycodone. His call back number is [phone]

## 2019-09-27 ENCOUNTER — Other Ambulatory Visit: Payer: Self-pay | Admitting: Physician Assistant

## 2019-09-27 MED ORDER — OXYCODONE HCL 5 MG PO TABS: 5.0000 mg | ORAL_TABLET | ORAL | 0 refills | Status: DC | PRN

## 2019-09-27 NOTE — Telephone Encounter (Signed)
Patient notified

## 2019-09-27 NOTE — Telephone Encounter (Signed)
Please advise 

## 2019-09-27 NOTE — Telephone Encounter (Signed)
Sent in

## 2019-10-03 ENCOUNTER — Other Ambulatory Visit: Payer: Self-pay | Admitting: Orthopaedic Surgery

## 2019-10-03 NOTE — Telephone Encounter (Signed)
Continue? Total joint surgery 09/22/19

## 2019-10-05 ENCOUNTER — Encounter: Payer: Self-pay | Admitting: Physician Assistant

## 2019-10-05 ENCOUNTER — Other Ambulatory Visit: Payer: Self-pay

## 2019-10-05 ENCOUNTER — Ambulatory Visit (INDEPENDENT_AMBULATORY_CARE_PROVIDER_SITE_OTHER): Payer: BC Managed Care – PPO | Admitting: Physician Assistant

## 2019-10-05 DIAGNOSIS — Z96642 Presence of left artificial hip joint: Secondary | ICD-10-CM

## 2019-10-05 NOTE — Progress Notes (Signed)
HPI: [name] returns today 2 weeks status post left total hip arthroplasty.  He states he is doing well.  He is finished up with home physical therapy.  Ambulates with a cane but states sometimes he does not need it.  Has some numbness over the anterior aspect of the hip.  Had no fevers chills shortness of breath.  Is on aspirin for anticoagulation which he was taking no anticoagulant prior to surgery.  Physical exam: Left hand with small seroma present.  No abnormal warmth erythema.  No dehiscence of the wound.  No drainage.  Left hip good range of motion.  Calf supple nontender. Seromas aspirated total of 20 cc of serosanguineous of aspirate obtained.    Impression: Status post left total hip arthroplasty 09/22/2019   Plan: Staples removed Steri-Strips applied.  He will follow-up with [place] in 1 month sooner if there is any questions concerns.  Scar tissue mobilization encouraged.

## 2019-10-19 ENCOUNTER — Other Ambulatory Visit: Payer: Self-pay | Admitting: Orthopaedic Surgery

## 2019-10-19 ENCOUNTER — Telehealth: Payer: Self-pay | Admitting: Orthopaedic Surgery

## 2019-10-19 MED ORDER — OXYCODONE HCL 5 MG PO TABS: 5.0000 mg | ORAL_TABLET | Freq: Four times a day (QID) | ORAL | 0 refills | Status: DC | PRN

## 2019-10-19 NOTE — Telephone Encounter (Signed)
Patient called to get a refill on the Oxycodone.  Please call and advise patient [phone]

## 2019-11-06 ENCOUNTER — Encounter: Payer: Self-pay | Admitting: Physician Assistant

## 2019-11-06 ENCOUNTER — Ambulatory Visit (INDEPENDENT_AMBULATORY_CARE_PROVIDER_SITE_OTHER): Payer: BC Managed Care – PPO | Admitting: Physician Assistant

## 2019-11-06 ENCOUNTER — Other Ambulatory Visit: Payer: Self-pay

## 2019-11-06 DIAGNOSIS — Z96642 Presence of left artificial hip joint: Secondary | ICD-10-CM

## 2019-11-06 NOTE — Progress Notes (Signed)
HPI: [name] returns today 6 weeks status post left total hip arthroplasty.  Patient did have a has a seroma that needs to be drained.  His range of motion strength improving.  He is having no real hip pain.  Physical exam: Left hip good range of motion surgical incisions healing well no signs of seroma or infection.  Ambulates without any assistive device  Impression: 6 weeks status post left total hip arthroplasty  Plan: We will see him back in 1 month to see how he is progressing.  Most likely if he is doing well see him back at 6 months postop get an AP pelvis lateral view of the left hip.  Questions encouraged and answered.

## 2019-12-06 ENCOUNTER — Ambulatory Visit: Payer: Self-pay

## 2019-12-06 ENCOUNTER — Ambulatory Visit (INDEPENDENT_AMBULATORY_CARE_PROVIDER_SITE_OTHER): Payer: BC Managed Care – PPO | Admitting: Orthopaedic Surgery

## 2019-12-06 ENCOUNTER — Other Ambulatory Visit: Payer: Self-pay

## 2019-12-06 ENCOUNTER — Encounter: Payer: Self-pay | Admitting: Orthopaedic Surgery

## 2019-12-06 DIAGNOSIS — Z96642 Presence of left artificial hip joint: Secondary | ICD-10-CM | POA: Diagnosis not present

## 2019-12-06 DIAGNOSIS — M79641 Pain in right hand: Secondary | ICD-10-CM | POA: Diagnosis not present

## 2019-12-06 NOTE — Progress Notes (Signed)
Office Visit Note   Patient: Robert Blair           Date of Birth: [birth year]           MRN: [id number] Visit Date: 12/06/2019              Requested by: [name], [address], [address] [place],  [place] [id number] PCP: [name], MD   Assessment & Plan: Visit Diagnoses:  1. Status post left hip replacement   2. Pain in right hand     Plan: From the standpoint of his right hand I would like him to try Voltaren gel over the joint at least twice daily.  I have given him advice for seeking out treatment from a hand surgeon in town if needed.  From a hip standpoint we will see him back in about 9 to 10 months with a repeat low-dose AP pelvis and lateral of his left operative hip.  He understands if there is any issues before then he will let us know.  All questions and concerns were answered and addressed.  Follow-Up Instructions: Return in about 9 months (around 09/06/2020).   Orders:  Orders Placed This Encounter  Procedures  . XR HIP UNILAT W OR W/O PELVIS 1V LEFT  . XR Hand Complete Right   No orders of the defined types were placed in this encounter.     Procedures: No procedures performed   Clinical Data: No additional findings.   Subjective: Chief Complaint  Patient presents with  . Left Hip - Routine Post Op  . Right Hand - Pain  The patient is a very pleasant 64 year old gentleman who is actually almost 11 weeks status post a left total hip arthroplasty.  His left hip is doing well and he has good range of motion and strength in the feels like he is improving significantly been what he dealt with preoperative.  He is also coming in for new issue today.  He has been dealing with right dominant hand pain for some time now he points to the middle finger MCP joint area as the source of his pain.  He had a mechanical fall and he jammed that finger against the surface.  He has had decreased motion and swelling since then and he does have some pain with  gripping activities.  He feels like there is a lot of grinding around that joint as well.  He denies any numbness and tingling and has not injured that before.  HPI  Review of Systems He currently denies any headache, chest pain, shortness of breath, fever, chills, nausea, vomiting  Objective: Vital Signs: There were no vitals taken for this visit.  Physical Exam He is alert and orient x3 and in no acute distress Ortho Exam Examination of his left operative hip shows he has excellent range of motion of that hip with minimal discomfort and pain.  There is just slight stiffness to be expected postoperative.  Examination of his right hand does show pain at the middle finger MCP joint.  The range of motion is almost full and there is no instability but there is certainly some crepitation at the joint itself.  He is neurovascularly intact.  The fingers ligamentously stable.  I do not feel the extensor tendon subluxating. Specialty Comments:  No specialty comments available.  Imaging: XR HIP UNILAT W OR W/O PELVIS 1V LEFT  Result Date: 12/06/2019 A low AP pelvis and lateral of the left hip shows a well-seated  total hip arthroplasty with no complicating features.  XR Hand Complete Right  Result Date: 12/06/2019 3 views of the right hand show arthritic changes at the index finger and middle finger MCP joint with joint space narrowing.  There is otherwise no acute findings    PMFS History: Patient Active Problem List   Diagnosis Date Noted  . Unilateral primary osteoarthritis, left hip 09/22/2019  . Tendinitis, de Quervain's 06/02/2019  . Pain of left hip joint 02/07/2019  . Recurrent right inguinal hernia 10/29/2014  . Hypertension 01/01/2012  . Hypothyroidism 01/01/2012  . History of adenomatous polyp of colon 01/01/2012   Past Medical History:  Diagnosis Date  . Arthritis   . Cancer ([place]) 2016   Basal cell  . Complication of anesthesia    URINARY RETENTION  . Depression   .  History of adenomatous polyp of colon    2008/  2013/  2016  . History of basal cell carcinoma excision    2015  nose  . History of urinary retention    10-30-2014  POST OP  . Hypothyroidism   . Seasonal allergies   . Unspecified essential hypertension     Family History  Problem Relation Age of Onset  . Breast cancer Mother   . Hypertension Mother   . Depression Mother   . Cancer - Other Mother        unknown GI cancer  . Colon polyps Father   . Stroke Father   . Alzheimer's disease Father   . Colon cancer Neg Hx   . Rectal cancer Neg Hx   . Stomach cancer Neg Hx   . Liver disease Neg Hx     Past Surgical History:  Procedure Laterality Date  . COLONOSCOPY  last one 02-25-2015  . HYDROCELE EXCISION Right 08/26/2015   Procedure: RIGHT HYDROCELECTOMY ADULT;  Surgeon: Franchot Gallo, MD;  Location: [facility];  Service: Urology;  Laterality: Right;  . INGUINAL HERNIA REPAIR Right 10/29/2014   Procedure: LAPAROSCOPIC REPAIR RECURRENT RIGHT INGUINAL ;  Surgeon: Fanny Skates, MD;  Location: [facility];  Service: General;  Laterality: Right;  . INGUINAL HERNIA REPAIR Right 1984  . INSERTION OF MESH Right 10/29/2014   Procedure: INSERTION OF MESH;  Surgeon: Fanny Skates, MD;  Location: [facility];  Service: General;  Laterality: Right;  . POLYPECTOMY    . TOE OSTEOTOMY Bilateral right 2002/  left 2013   great toe  . TOTAL HIP ARTHROPLASTY Left 09/22/2019   Procedure: LEFT TOTAL HIP ARTHROPLASTY ANTERIOR APPROACH;  Surgeon: Mcarthur Rossetti, MD;  Location: [facility];  Service: Orthopedics;  Laterality: Left;   Social History   Occupational History  . Occupation: self employed  Tobacco Use  . Smoking status: Never Smoker  . Smokeless tobacco: Never Used  Substance and Sexual Activity  . Alcohol use: Yes    Comment: rare  . Drug use: No  . Sexual activity: Not on file

## 2019-12-20 ENCOUNTER — Telehealth: Payer: Self-pay | Admitting: Orthopaedic Surgery

## 2019-12-20 NOTE — Telephone Encounter (Signed)
12/06/19 ov note faxed to Dr. Bertis Ruddy office, attn: Hassan Rowan. Dr. Ninfa Linden referred pt

## 2020-02-02 ENCOUNTER — Other Ambulatory Visit: Payer: Self-pay | Admitting: Surgery

## 2020-02-27 ENCOUNTER — Ambulatory Visit: Payer: BC Managed Care – PPO | Admitting: Physician Assistant

## 2020-03-04 ENCOUNTER — Ambulatory Visit: Payer: BC Managed Care – PPO | Admitting: Physician Assistant

## 2020-05-09 ENCOUNTER — Ambulatory Visit (INDEPENDENT_AMBULATORY_CARE_PROVIDER_SITE_OTHER): Payer: BC Managed Care – PPO | Admitting: Orthopaedic Surgery

## 2020-05-09 ENCOUNTER — Ambulatory Visit: Payer: Self-pay

## 2020-05-09 ENCOUNTER — Encounter: Payer: Self-pay | Admitting: Orthopaedic Surgery

## 2020-05-09 ENCOUNTER — Other Ambulatory Visit: Payer: Self-pay

## 2020-05-09 VITALS — Ht 67.0 in | Wt 210.0 lb

## 2020-05-09 DIAGNOSIS — M25521 Pain in right elbow: Secondary | ICD-10-CM

## 2020-05-09 DIAGNOSIS — M79671 Pain in right foot: Secondary | ICD-10-CM | POA: Diagnosis not present

## 2020-05-09 DIAGNOSIS — M79674 Pain in right toe(s): Secondary | ICD-10-CM | POA: Diagnosis not present

## 2020-05-09 DIAGNOSIS — M19021 Primary osteoarthritis, right elbow: Secondary | ICD-10-CM

## 2020-05-09 MED ORDER — LIDOCAINE HCL 1 % IJ SOLN
1.0000 mL | INTRAMUSCULAR | Status: AC | PRN
  Administered 2020-05-09: 1 mL

## 2020-05-09 MED ORDER — BUPIVACAINE HCL 0.5 % IJ SOLN
1.0000 mL | INTRAMUSCULAR | Status: AC | PRN
  Administered 2020-05-09: 1 mL via INTRA_ARTICULAR

## 2020-05-09 MED ORDER — METHYLPREDNISOLONE ACETATE 40 MG/ML IJ SUSP
40.0000 mg | INTRAMUSCULAR | Status: AC | PRN
  Administered 2020-05-09: 40 mg via INTRA_ARTICULAR

## 2020-05-09 NOTE — Progress Notes (Signed)
Office Visit Note   Patient: Robert Blair           Date of Birth: Oct 01, 1955           MRN: [id number] Visit Date: 05/09/2020              Requested by: Robert Blair, [place], [address],  [place] [address] PCP: Robert Dy, MD   Assessment & Plan: Visit Diagnoses:  1. Pain in right elbow   2. Pain in right foot   3. Great toe pain, right   4. Primary osteoarthritis of right elbow     Plan: [name] had a successful left total hip replacement by Dr. Ninfa Blair in January and very happy with the results.  He is seen today for evaluation of right great toe and right elbow pain.  X-rays of his right great toe demonstrate hypertrophic arthritic changes of the metatarsal phalangeal joint with little if any joint space remaining.  These are consistent with osteoarthritis.  He has limited range of motion and pain.  He has had prior bunion surgery.  Definitive treatment would be fusion of the great toe metatarsal phalangeal joint.  I had like to refer him to Dr. Sharol Blair.  He also is having some trouble with pain and limited range of motion particularly in extension of his right elbow.  Films reveal primary osteoarthritis.  No prior history of injury.  I am going to inject it with cortisone.  Long discussion regarding issues he may have over time.  Certainly can try over-the-counter medicines and return for further injections if necessary  Follow-Up Instructions: Return Will refer to Dr. Sharol Blair for consideration of right great toe metatarsal phalangeal joint fusion.   Orders:  Orders Placed This Encounter  Procedures   Medium Joint Inj   XR Foot Complete Right   XR Elbow 2 Views Right   Ambulatory referral to Orthopedic Surgery   No orders of the defined types were placed in this encounter.     Procedures: Medium Joint Inj on 05/09/2020 5:12 PM Details: 25 G 1.5 in needle, anterolateral approach Medications: 1 mL lidocaine 1 %; 40 mg methylPREDNISolone acetate 40  MG/ML; 1 mL bupivacaine 0.5 %      Clinical Data: No additional findings.   Subjective: Chief Complaint  Patient presents with   Right Foot - Pain  Patient presents today for his right great toe. He said that it has been painful for 3 weeks. He states that the pain is causing him to walk differently and now has caused pain in his leg muscles. He said that a narrow toe box on his shoes causes it to hurt and makes it ache once the shoe is off. He states that he had his left foot bunion corrected with Dr.Nixon Blair. Also having some pain about the medial lateral aspect of his right elbow with inability to fully extend the elbow.  He notes that he is tried exercises in the past and even an extension device that helped "a little bit".  But still having some difficulty with lifting heavy objects related to the pain.  No history of injury or trauma.  This is a chronic issue that he has had for some time  HPI  Review of Systems   Objective: Vital Signs: Ht 5\' 7"  (1.702 m)    Wt 210 lb (95.3 kg)    BMI 32.89 kg/m   Physical Exam Constitutional:      Appearance: He is well-developed.  Eyes:     Pupils: Pupils are equal, round, and reactive to light.  Pulmonary:     Effort: Pulmonary effort is normal.  Skin:    General: Skin is warm and dry.  Neurological:     Mental Status: He is alert and oriented to person, place, and time.  Psychiatric:        Behavior: Behavior normal.     Ortho Exam right foot with hypertrophic changes about the metatarsal phalangeal joint great toe.  Little if any motion of the metatarsal phalangeal joint.  Normal motion of the IP joint great toe.  Neurologically intact.  Well-healed dorsal incision across the metatarsal phalangeal joint from prior bunion surgery Right elbow lacks about 15 degrees to full extension and flexed over 100 degrees.  Does have some mild medial lateral epicondylar pain but no particular pain with flexion extension with grip.  Motor  and sensory exam intact.  No obvious effusion  Specialty Comments:  No specialty comments available.  Imaging: XR Elbow 2 Views Right  Result Date: 05/09/2020 Of the right elbow obtained in 2 projections.  There is evidence of osteoarthritis with anterior and posterior spurring and joint narrowing.  No obvious loose bodies.  No varus valgus deformity  XR Foot Complete Right  Result Date: 05/09/2020 X-rays of the right foot were obtained in 3 projections.  There is been a prior bunionectomy with single dorsal screw at the first metatarsal tarsal.  There is hypertrophic spurring on both sides of the joint with absence of the metatarsal phalangeal joint space consistent with osteoarthritis.  No acute changes.  Films are consistent with end-stage osteoarthritis of the great toe metatarsal phalangeal joint    PMFS History: Patient Active Problem List   Diagnosis Date Noted   Great toe pain, right 05/09/2020   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   Unilateral primary osteoarthritis, left hip 09/22/2019   Tendinitis, de Quervain's 06/02/2019   Pain of left hip joint 02/07/2019   Recurrent right inguinal hernia 10/29/2014   Hypertension 01/01/2012   Hypothyroidism 01/01/2012   History of adenomatous polyp of colon 01/01/2012   Past Medical History:  Diagnosis Date   Arthritis    Cancer ([place]) 2016   Basal cell   Complication of anesthesia    URINARY RETENTION   Depression    History of adenomatous polyp of colon    2008/  2013/  2016   History of basal cell carcinoma excision    2015  nose   History of urinary retention    10-30-2014  POST OP   Hypothyroidism    Seasonal allergies    Unspecified essential hypertension     Family History  Problem Relation Age of Onset   Breast cancer Mother    Hypertension Mother    Depression Mother    Cancer - Other Mother        unknown GI cancer   Colon polyps Father    Stroke Father    Alzheimer's  disease Father    Colon cancer Neg Hx    Rectal cancer Neg Hx    Stomach cancer Neg Hx    Liver disease Neg Hx     Past Surgical History:  Procedure Laterality Date   COLONOSCOPY  last one 02-25-2015   HYDROCELE EXCISION Right 08/26/2015   Procedure: RIGHT HYDROCELECTOMY ADULT;  Surgeon: Franchot Gallo, MD;  [facility];  Service: Urology;  Laterality: Right;   INGUINAL HERNIA REPAIR Right 10/29/2014   Procedure:  LAPAROSCOPIC REPAIR RECURRENT RIGHT INGUINAL ;  Surgeon: Fanny Skates, MD;  Location: [facility];  Service: General;  Laterality: Right;   INGUINAL HERNIA REPAIR Right 1984   INSERTION OF MESH Right 10/29/2014   Procedure: INSERTION OF MESH;  Surgeon: Fanny Skates, MD;  Location: [facility];  Service: General;  Laterality: Right;   POLYPECTOMY     TOE OSTEOTOMY Bilateral right 2002/  left 2013   great toe   TOTAL HIP ARTHROPLASTY Left 09/22/2019   Procedure: LEFT TOTAL HIP ARTHROPLASTY ANTERIOR APPROACH;  Surgeon: Mcarthur Rossetti, MD;  Location: [facility];  Service: Orthopedics;  Laterality: Left;   Social History   Occupational History   Occupation: self employed  Tobacco Use   Smoking status: Never Smoker   Smokeless tobacco: Never Used  Scientific laboratory technician Use: Never used  Substance and Sexual Activity   Alcohol use: Yes    Comment: rare   Drug use: No   Sexual activity: Not on file

## 2020-05-16 ENCOUNTER — Ambulatory Visit (INDEPENDENT_AMBULATORY_CARE_PROVIDER_SITE_OTHER): Payer: BC Managed Care – PPO | Admitting: Orthopedic Surgery

## 2020-05-16 ENCOUNTER — Encounter: Payer: Self-pay | Admitting: Orthopedic Surgery

## 2020-05-16 VITALS — Ht 67.0 in | Wt 210.0 lb

## 2020-05-16 DIAGNOSIS — M2021 Hallux rigidus, right foot: Secondary | ICD-10-CM

## 2020-05-16 DIAGNOSIS — T819XXA Unspecified complication of procedure, initial encounter: Secondary | ICD-10-CM

## 2020-05-17 ENCOUNTER — Encounter: Payer: Self-pay | Admitting: Orthopedic Surgery

## 2020-05-17 NOTE — Progress Notes (Signed)
Office Visit Note   Patient: Robert Blair           Date of Birth: 1956-04-28           MRN: [id number] Visit Date: 05/16/2020              Requested by: [name], [address], [place],  [place] [address] PCP: [name], MD  Chief Complaint  Patient presents with  . Right Foot - Pain      HPI: Patient is a 64 year old gentleman who is seen for initial evaluation and referral from Dr. Durward Fortes.  Patient states he has had previous bunion surgery the right great toe the joint is collapsed patient has decreased range of motion and complains of pain with activities of daily living.  Assessment & Plan: Visit Diagnoses:  1. Hallux rigidus, right foot   2. Failed bunionectomy, initial encounter     Plan: We will plan for a fusion of the great toe MTP joint as an outpatient surgery.  Patient states he would like to proceed with surgery in November or December he will call us to schedule the surgery.  Follow-Up Instructions: Return if symptoms worsen or fail to improve.   Ortho Exam  Patient is alert, oriented, no adenopathy, well-dressed, normal affect, normal respiratory effort. Examination patient is status post bunion surgery to the right great toe by podiatry there is an incision dorsal medially.  Patient has large osteophytic bone spurs around the MTP joint with essentially no range of motion he has a good dorsalis pedis pulse he has good hair growth in the foot.  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  Imaging: No results found. No images are attached to the encounter.  Labs: No results found for: HGBA1C, ESRSEDRATE, CRP, LABURIC, REPTSTATUS, GRAMSTAIN, CULT, LABORGA   Lab Results  Component Value Date   ALBUMIN 4.5 10/24/2014    No results found for: MG No results found for: VD25OH  No results found for:  PREALBUMIN CBC EXTENDED Latest Ref Rng & Units 09/18/2019 08/26/2015 10/24/2014  WBC 4.0 - 10.5 K/uL 10.2 - 9.1  RBC 4.22 - 5.81 MIL/uL 5.74 - 5.24  HGB 13.0 - 17.0 g/dL 17.3(H) 17.0 15.8  HCT 39 - 52 % 50.9 50.0 45.5  PLT 150 - 400 K/uL 250 - 243  NEUTROABS 1.7 - 7.7 K/uL - - 6.0  LYMPHSABS 0.7 - 4.0 K/uL - - 2.1     Body mass index is 32.89 kg/m.  Orders:  No orders of the defined types were placed in this encounter.  No orders of the defined types were placed in this encounter.    Procedures: No procedures performed  Clinical Data: No additional findings.  ROS:  All other systems negative, except as noted in the HPI. Review of Systems  Objective: Vital Signs: Ht 5\' 7"  (1.702 m)   Wt 210 lb (95.3 kg)   BMI 32.89 kg/m   Specialty Comments:  No specialty comments available.  PMFS History: Patient Active Problem List   Diagnosis Date Noted  . Great toe pain, right 05/09/2020  . 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  . Unilateral primary osteoarthritis, left hip 09/22/2019  . Tendinitis, de Quervain's 06/02/2019  . Pain of left hip joint 02/07/2019  . Recurrent right inguinal hernia 10/29/2014  . Hypertension 01/01/2012  . Hypothyroidism 01/01/2012  .  History of adenomatous polyp of colon 01/01/2012   Past Medical History:  Diagnosis Date  . Arthritis   . Cancer ([place]) 2016   Basal cell  . Complication of anesthesia    URINARY RETENTION  . Depression   . History of adenomatous polyp of colon    2008/  2013/  2016  . History of basal cell carcinoma excision    2015  nose  . History of urinary retention    10-30-2014  POST OP  . Hypothyroidism   . Seasonal allergies   . Unspecified essential hypertension     Family History  Problem Relation Age of Onset  . Breast cancer Mother   . Hypertension Mother   . Depression Mother   . Cancer - Other Mother        unknown GI cancer  . Colon polyps Father   . Stroke Father   . Alzheimer's disease  Father   . Colon cancer Neg Hx   . Rectal cancer Neg Hx   . Stomach cancer Neg Hx   . Liver disease Neg Hx     Past Surgical History:  Procedure Laterality Date  . COLONOSCOPY  last one 02-25-2015  . HYDROCELE EXCISION Right 08/26/2015   Procedure: RIGHT HYDROCELECTOMY ADULT;  Surgeon: Franchot Gallo, MD;  Location: [facility];  Service: Urology;  Laterality: Right;  . INGUINAL HERNIA REPAIR Right 10/29/2014   Procedure: LAPAROSCOPIC REPAIR RECURRENT RIGHT INGUINAL ;  Surgeon: Fanny Skates, MD;  Location: [facility];  Service: General;  Laterality: Right;  . INGUINAL HERNIA REPAIR Right 1984  . INSERTION OF MESH Right 10/29/2014   Procedure: INSERTION OF MESH;  Surgeon: Fanny Skates, MD;  Location: [facility];  Service: General;  Laterality: Right;  . POLYPECTOMY    . TOE OSTEOTOMY Bilateral right 2002/  left 2013   great toe  . TOTAL HIP ARTHROPLASTY Left 09/22/2019   Procedure: LEFT TOTAL HIP ARTHROPLASTY ANTERIOR APPROACH;  Surgeon: Mcarthur Rossetti, MD;  Location: [facility];  Service: Orthopedics;  Laterality: Left;   Social History   Occupational History  . Occupation: self employed  Tobacco Use  . Smoking status: Never Smoker  . Smokeless tobacco: Never Used  Vaping Use  . Vaping Use: Never used  Substance and Sexual Activity  . Alcohol use: Yes    Comment: rare  . Drug use: No  . Sexual activity: Not on file

## 2020-06-10 ENCOUNTER — Other Ambulatory Visit: Payer: Self-pay

## 2020-06-10 ENCOUNTER — Encounter (HOSPITAL_BASED_OUTPATIENT_CLINIC_OR_DEPARTMENT_OTHER): Payer: Self-pay | Admitting: Surgery

## 2020-06-13 ENCOUNTER — Encounter (HOSPITAL_BASED_OUTPATIENT_CLINIC_OR_DEPARTMENT_OTHER)
Admission: RE | Admit: 2020-06-13 | Discharge: 2020-06-13 | Disposition: A | Discharge status: Home / Self Care | Payer: BC Managed Care – PPO | Source: Ambulatory Visit | Attending: Surgery | Admitting: Surgery

## 2020-06-13 ENCOUNTER — Other Ambulatory Visit (HOSPITAL_COMMUNITY)
Admission: RE | Admit: 2020-06-13 | Discharge: 2020-06-13 | Disposition: A | Discharge status: Home / Self Care | Payer: BC Managed Care – PPO | Source: Ambulatory Visit | Attending: Surgery | Admitting: Surgery

## 2020-06-13 ENCOUNTER — Other Ambulatory Visit: Payer: Self-pay | Admitting: Surgery

## 2020-06-13 DIAGNOSIS — K429 Umbilical hernia without obstruction or gangrene: Secondary | ICD-10-CM | POA: Diagnosis present

## 2020-06-13 DIAGNOSIS — F329 Major depressive disorder, single episode, unspecified: Secondary | ICD-10-CM | POA: Diagnosis not present

## 2020-06-13 DIAGNOSIS — F32A Depression, unspecified: Secondary | ICD-10-CM | POA: Diagnosis not present

## 2020-06-13 DIAGNOSIS — Z7982 Long term (current) use of aspirin: Secondary | ICD-10-CM | POA: Diagnosis not present

## 2020-06-13 DIAGNOSIS — Z20822 Contact with and (suspected) exposure to covid-19: Secondary | ICD-10-CM | POA: Insufficient documentation

## 2020-06-13 DIAGNOSIS — Z01812 Encounter for preprocedural laboratory examination: Secondary | ICD-10-CM | POA: Insufficient documentation

## 2020-06-13 DIAGNOSIS — Z96649 Presence of unspecified artificial hip joint: Secondary | ICD-10-CM | POA: Diagnosis not present

## 2020-06-13 DIAGNOSIS — I10 Essential (primary) hypertension: Secondary | ICD-10-CM | POA: Diagnosis not present

## 2020-06-13 DIAGNOSIS — Z79890 Hormone replacement therapy (postmenopausal): Secondary | ICD-10-CM | POA: Diagnosis not present

## 2020-06-13 DIAGNOSIS — Z79899 Other long term (current) drug therapy: Secondary | ICD-10-CM | POA: Diagnosis not present

## 2020-06-13 DIAGNOSIS — Z6833 Body mass index (BMI) 33.0-33.9, adult: Secondary | ICD-10-CM | POA: Diagnosis not present

## 2020-06-13 DIAGNOSIS — K439 Ventral hernia without obstruction or gangrene: Secondary | ICD-10-CM | POA: Diagnosis not present

## 2020-06-13 DIAGNOSIS — E669 Obesity, unspecified: Secondary | ICD-10-CM | POA: Diagnosis not present

## 2020-06-13 LAB — BASIC METABOLIC PANEL
Anion gap: 9 (ref 5–15)
BUN: 18 mg/dL (ref 8–23)
CO2: 27 mmol/L (ref 22–32)
Calcium: 9.3 mg/dL (ref 8.9–10.3)
Chloride: 104 mmol/L (ref 98–111)
Creatinine, Ser: 1.03 mg/dL (ref 0.61–1.24)
GFR calc Af Amer: >60 mL/min (ref >60)
GFR calc non Af Amer: >60 mL/min (ref >60)
Glucose, Bld: 134 mg/dL — ABNORMAL HIGH (ref 70–99)
Potassium: 2.9 mmol/L — ABNORMAL LOW (ref 3.5–5.1)
Sodium: 140 mmol/L (ref 135–145)

## 2020-06-13 LAB — SARS CORONAVIRUS 2 (TAT 6-24 HRS): SARS Coronavirus 2: NEGATIVE

## 2020-06-14 NOTE — Progress Notes (Signed)
K+ 2.9, reviewed by Dr. Smith Robert, will repeat BMET day of surgery.

## 2020-06-16 NOTE — H&P (Signed)
   Robert Blair  Location: [facility] Patient #: [id number] DOB: 28-Jun-1956 Married / Language: English / Race: White Male   History of Present Illness  The patient is a 64 year old male who presents with an umbilical hernia.  Chief complaint: Umbilical hernia  This is a 64 year old gentleman that has been operated on in the past by Dr. Dalbert Batman for an inguinal hernia. He now has an enlarging, symptomatic umbilical hernia. He has had the hernia for several years. He recently had a hip replacement earlier this year and is now exercising and losing weight. He reports that the hernias become more prominent. He has mild discomfort but no obstructive symptoms.   Allergies  Chamomile Flower *CHEMICALS*  No Known Drug Allergies  [11/16/2014]: (Marked as Inactive)  Medication History amLODIPine Besylate (10MG  Tablet, Oral) Active. Aspirin (81MG  Tablet, Oral) Active. buPROPion HCl ER (XL) (150MG  Tablet ER 24HR, Oral) Active. Cholecalciferol (50 MCG(2000 UT) Tablet, Oral) Active. clomiPHENE Citrate (50MG  Tablet, Oral) Active. Colace (100MG  Capsule, Oral) Active. Fexofenadine HCl (180MG  Tablet, Oral) Active. Fluticasone Propionate (50MCG/ACT Suspension, Nasal) Active. HYDROcodone-Acetaminophen (5-325MG  Tablet, Oral) Active. Isosorbide Dinitrate (30MG  Tablet, Oral) Active. Levothyroxine Sodium (112MCG Tablet, Oral) Active. Metoprolol Succinate ER (50MG  Tablet ER 24HR, Oral) Active. Multivitamin (Oral) Active. Vyvanse (70MG  Capsule, Oral) Active. Valsartan-hydroCHLOROthiazide (320-25MG  Tablet, Oral) Active. Potassium (75MG  Tablet, Oral) Active. Medications Reconciled  Vitals Weight: 216 lb Height: 68in Body Surface Area: 2.11 m Body Mass Index: 32.84 kg/m  Pulse: 81 (Regular)  BP: 136/80(Sitting, Left Arm, Standard)     Physical Exam  The physical exam findings are as follows: Note: He appears well exam  His abdomen is soft and  nontender  He has rectus diastases as well as a moderate-sized hernia at his umbilicus and slightly above which contains omentum which I can reduce mostly. It is nontender.    Assessment & Plan   UMBILICAL HERNIA (A26.3)  Impression: I had a long discussion with the patient regarding umbilical hernias. We discussed the reasons for repair of hernias. We discussed the risks of the hernia getting larger and potential for incarceration or strangulation without surgery. This is a moderate-sized hernia with some incarcerated omentum. He does also have a rectus diastases. I would recommend an umbilical hernia repair with mesh. I discussed the procedure with him in detail. I discussed the risk which includes as not limited to bleeding, infection, injury to surrounding structures, the use of mesh, hernia recurrence, cardiopulmonary issues, postoperative recovery, etc.

## 2020-06-17 ENCOUNTER — Ambulatory Visit (HOSPITAL_BASED_OUTPATIENT_CLINIC_OR_DEPARTMENT_OTHER): Payer: BC Managed Care – PPO | Admitting: Anesthesiology

## 2020-06-17 ENCOUNTER — Ambulatory Visit (HOSPITAL_BASED_OUTPATIENT_CLINIC_OR_DEPARTMENT_OTHER)
Admission: RE | Admit: 2020-06-17 | Discharge: 2020-06-17 | Disposition: A | Discharge status: Home / Self Care | Payer: BC Managed Care – PPO | Attending: Surgery | Admitting: Surgery

## 2020-06-17 ENCOUNTER — Encounter (HOSPITAL_BASED_OUTPATIENT_CLINIC_OR_DEPARTMENT_OTHER): Payer: Self-pay | Admitting: Surgery

## 2020-06-17 ENCOUNTER — Encounter (HOSPITAL_BASED_OUTPATIENT_CLINIC_OR_DEPARTMENT_OTHER)
Admission: RE | Disposition: A | Discharge status: Home / Self Care | Payer: Self-pay | Source: Home / Self Care | Attending: Surgery

## 2020-06-17 ENCOUNTER — Other Ambulatory Visit: Payer: Self-pay

## 2020-06-17 DIAGNOSIS — K429 Umbilical hernia without obstruction or gangrene: Secondary | ICD-10-CM | POA: Insufficient documentation

## 2020-06-17 DIAGNOSIS — Z96649 Presence of unspecified artificial hip joint: Secondary | ICD-10-CM | POA: Insufficient documentation

## 2020-06-17 DIAGNOSIS — Z79890 Hormone replacement therapy (postmenopausal): Secondary | ICD-10-CM | POA: Insufficient documentation

## 2020-06-17 DIAGNOSIS — E669 Obesity, unspecified: Secondary | ICD-10-CM | POA: Insufficient documentation

## 2020-06-17 DIAGNOSIS — Z6833 Body mass index (BMI) 33.0-33.9, adult: Secondary | ICD-10-CM | POA: Insufficient documentation

## 2020-06-17 DIAGNOSIS — Z7982 Long term (current) use of aspirin: Secondary | ICD-10-CM | POA: Insufficient documentation

## 2020-06-17 DIAGNOSIS — I10 Essential (primary) hypertension: Secondary | ICD-10-CM | POA: Insufficient documentation

## 2020-06-17 DIAGNOSIS — K439 Ventral hernia without obstruction or gangrene: Secondary | ICD-10-CM | POA: Insufficient documentation

## 2020-06-17 DIAGNOSIS — F32A Depression, unspecified: Secondary | ICD-10-CM | POA: Insufficient documentation

## 2020-06-17 DIAGNOSIS — Z79899 Other long term (current) drug therapy: Secondary | ICD-10-CM | POA: Insufficient documentation

## 2020-06-17 DIAGNOSIS — F329 Major depressive disorder, single episode, unspecified: Secondary | ICD-10-CM | POA: Insufficient documentation

## 2020-06-17 HISTORY — PX: UMBILICAL HERNIA REPAIR: SHX196

## 2020-06-17 HISTORY — PX: INSERTION OF MESH: SHX5868

## 2020-06-17 LAB — BASIC METABOLIC PANEL
Anion gap: 8 (ref 5–15)
BUN: 13 mg/dL (ref 8–23)
CO2: 26 mmol/L (ref 22–32)
Calcium: 8.8 mg/dL — ABNORMAL LOW (ref 8.9–10.3)
Chloride: 106 mmol/L (ref 98–111)
Creatinine, Ser: 0.94 mg/dL (ref 0.61–1.24)
GFR calc Af Amer: >60 mL/min (ref >60)
GFR calc non Af Amer: >60 mL/min (ref >60)
Glucose, Bld: 94 mg/dL (ref 70–99)
Potassium: 3.6 mmol/L (ref 3.5–5.1)
Sodium: 140 mmol/L (ref 135–145)

## 2020-06-17 SURGERY — REPAIR, HERNIA, UMBILICAL, ADULT
Anesthesia: General | Site: Abdomen

## 2020-06-17 MED ORDER — OXYCODONE HCL 5 MG/5ML PO SOLN: 5.0000 mg | Freq: Once | ORAL | Status: DC | PRN

## 2020-06-17 MED ORDER — FENTANYL CITRATE (PF) 100 MCG/2ML IJ SOLN
INTRAMUSCULAR | Status: DC | PRN
Start: 2020-06-17 — End: 2020-06-17
  Administered 2020-06-17 (×2): 25 ug via INTRAVENOUS

## 2020-06-17 MED ORDER — ONDANSETRON HCL 4 MG/2ML IJ SOLN
INTRAMUSCULAR | Status: AC
  Filled 2020-06-17: qty 2

## 2020-06-17 MED ORDER — DEXAMETHASONE SODIUM PHOSPHATE 10 MG/ML IJ SOLN
INTRAMUSCULAR | Status: DC | PRN
  Administered 2020-06-17: 5 mg via INTRAVENOUS

## 2020-06-17 MED ORDER — CHLORHEXIDINE GLUCONATE CLOTH 2 % EX PADS: 6.0000 | MEDICATED_PAD | Freq: Once | CUTANEOUS | Status: DC

## 2020-06-17 MED ORDER — BUPIVACAINE HCL 0.25 % IJ SOLN
INTRAMUSCULAR | Status: DC | PRN
  Administered 2020-06-17: 20 mL

## 2020-06-17 MED ORDER — HYDROMORPHONE HCL 1 MG/ML IJ SOLN
INTRAMUSCULAR | Status: AC
  Filled 2020-06-17: qty 0.5

## 2020-06-17 MED ORDER — GABAPENTIN 300 MG PO CAPS
300.0000 mg | ORAL_CAPSULE | ORAL | Status: AC
  Administered 2020-06-17: 300 mg via ORAL

## 2020-06-17 MED ORDER — OXYCODONE HCL 5 MG PO TABS: 5.0000 mg | ORAL_TABLET | Freq: Once | ORAL | Status: DC | PRN

## 2020-06-17 MED ORDER — PROMETHAZINE HCL 25 MG/ML IJ SOLN: 6.2500 mg | INTRAMUSCULAR | Status: DC | PRN

## 2020-06-17 MED ORDER — FENTANYL CITRATE (PF) 100 MCG/2ML IJ SOLN
INTRAMUSCULAR | Status: AC
  Filled 2020-06-17: qty 2

## 2020-06-17 MED ORDER — DEXAMETHASONE SODIUM PHOSPHATE 10 MG/ML IJ SOLN
INTRAMUSCULAR | Status: AC
  Filled 2020-06-17: qty 1

## 2020-06-17 MED ORDER — PROPOFOL 10 MG/ML IV BOLUS
INTRAVENOUS | Status: DC | PRN
  Administered 2020-06-17: 200 mg via INTRAVENOUS

## 2020-06-17 MED ORDER — ENSURE PRE-SURGERY PO LIQD: 296.0000 mL | Freq: Once | ORAL | Status: DC

## 2020-06-17 MED ORDER — CEFAZOLIN SODIUM-DEXTROSE 2-4 GM/100ML-% IV SOLN
2.0000 g | INTRAVENOUS | Status: AC
  Administered 2020-06-17: 2 g via INTRAVENOUS

## 2020-06-17 MED ORDER — 0.9 % SODIUM CHLORIDE (POUR BTL) OPTIME
TOPICAL | Status: DC | PRN
  Administered 2020-06-17: 1000 mL

## 2020-06-17 MED ORDER — ONDANSETRON HCL 4 MG/2ML IJ SOLN
INTRAMUSCULAR | Status: DC | PRN
  Administered 2020-06-17: 4 mg via INTRAVENOUS

## 2020-06-17 MED ORDER — GABAPENTIN 300 MG PO CAPS
ORAL_CAPSULE | ORAL | Status: AC
  Filled 2020-06-17: qty 1

## 2020-06-17 MED ORDER — LIDOCAINE HCL (CARDIAC) PF 100 MG/5ML IV SOSY
PREFILLED_SYRINGE | INTRAVENOUS | Status: DC | PRN
  Administered 2020-06-17: 100 mg via INTRAVENOUS

## 2020-06-17 MED ORDER — MIDAZOLAM HCL 5 MG/5ML IJ SOLN
INTRAMUSCULAR | Status: DC | PRN
  Administered 2020-06-17: 2 mg via INTRAVENOUS

## 2020-06-17 MED ORDER — ACETAMINOPHEN 500 MG PO TABS
1000.0000 mg | ORAL_TABLET | ORAL | Status: AC
  Administered 2020-06-17: 1000 mg via ORAL

## 2020-06-17 MED ORDER — OXYCODONE HCL 5 MG PO TABS: 5.0000 mg | ORAL_TABLET | Freq: Four times a day (QID) | ORAL | 0 refills | Status: DC | PRN

## 2020-06-17 MED ORDER — DEXAMETHASONE SODIUM PHOSPHATE 10 MG/ML IJ SOLN
INTRAMUSCULAR | Status: AC
  Filled 2020-06-17: qty 2

## 2020-06-17 MED ORDER — LACTATED RINGERS IV SOLN: INTRAVENOUS | Status: DC

## 2020-06-17 MED ORDER — AMISULPRIDE (ANTIEMETIC) 5 MG/2ML IV SOLN: 10.0000 mg | Freq: Once | INTRAVENOUS | Status: DC | PRN

## 2020-06-17 MED ORDER — ACETAMINOPHEN 500 MG PO TABS
ORAL_TABLET | ORAL | Status: AC
  Filled 2020-06-17: qty 2

## 2020-06-17 MED ORDER — HYDROMORPHONE HCL 1 MG/ML IJ SOLN
0.2500 mg | INTRAMUSCULAR | Status: DC | PRN
  Administered 2020-06-17: 0.5 mg via INTRAVENOUS

## 2020-06-17 MED ORDER — PROPOFOL 10 MG/ML IV BOLUS
INTRAVENOUS | Status: AC
  Filled 2020-06-17: qty 80

## 2020-06-17 MED ORDER — LIDOCAINE 2% (20 MG/ML) 5 ML SYRINGE
INTRAMUSCULAR | Status: AC
  Filled 2020-06-17: qty 5

## 2020-06-17 MED ORDER — CEFAZOLIN SODIUM-DEXTROSE 2-4 GM/100ML-% IV SOLN
INTRAVENOUS | Status: AC
  Filled 2020-06-17: qty 100

## 2020-06-17 MED ORDER — MIDAZOLAM HCL 2 MG/2ML IJ SOLN
INTRAMUSCULAR | Status: AC
  Filled 2020-06-17: qty 2

## 2020-06-17 MED ORDER — MEPERIDINE HCL 25 MG/ML IJ SOLN: 6.2500 mg | INTRAMUSCULAR | Status: DC | PRN

## 2020-06-17 SURGICAL SUPPLY — 43 items
ADH SKN CLS APL DERMABOND .7 (GAUZE/BANDAGES/DRESSINGS) ×2
APL PRP STRL LF DISP 70% ISPRP (MISCELLANEOUS) ×1
BLADE CLIPPER SURG (BLADE) IMPLANT
BLADE SURG 15 STRL LF DISP TIS (BLADE) ×1 IMPLANT
BLADE SURG 15 STRL SS (BLADE) ×3
CANISTER SUCT 1200ML W/VALVE (MISCELLANEOUS) IMPLANT
CHLORAPREP W/TINT 26 (MISCELLANEOUS) ×3 IMPLANT
COVER BACK TABLE 60X90IN (DRAPES) ×3 IMPLANT
COVER MAYO STAND STRL (DRAPES) ×3 IMPLANT
COVER WAND RF STERILE (DRAPES) IMPLANT
DECANTER SPIKE VIAL GLASS SM (MISCELLANEOUS) IMPLANT
DERMABOND ADVANCED (GAUZE/BANDAGES/DRESSINGS) ×4
DERMABOND ADVANCED .7 DNX12 (GAUZE/BANDAGES/DRESSINGS) ×2 IMPLANT
DRAPE LAPAROTOMY 100X72 PEDS (DRAPES) ×3 IMPLANT
DRAPE UTILITY XL STRL (DRAPES) ×3 IMPLANT
DRSG TEGADERM 2-3/8X2-3/4 SM (GAUZE/BANDAGES/DRESSINGS) IMPLANT
ELECT REM PT RETURN 9FT ADLT (ELECTROSURGICAL) ×3
ELECTRODE REM PT RTRN 9FT ADLT (ELECTROSURGICAL) ×1 IMPLANT
GLOVE SURG SIGNA 7.5 PF LTX (GLOVE) ×3 IMPLANT
GOWN STRL REUS W/ TWL LRG LVL3 (GOWN DISPOSABLE) ×1 IMPLANT
GOWN STRL REUS W/ TWL XL LVL3 (GOWN DISPOSABLE) ×1 IMPLANT
GOWN STRL REUS W/TWL LRG LVL3 (GOWN DISPOSABLE) ×3
GOWN STRL REUS W/TWL XL LVL3 (GOWN DISPOSABLE) ×3
MESH VENTRALEX ST 2.5 CRC MED (Mesh General) ×3 IMPLANT
NEEDLE HYPO 25X1 1.5 SAFETY (NEEDLE) ×3 IMPLANT
NS IRRIG 1000ML POUR BTL (IV SOLUTION) IMPLANT
PACK BASIN DAY SURGERY FS (CUSTOM PROCEDURE TRAY) ×3 IMPLANT
PENCIL SMOKE EVACUATOR (MISCELLANEOUS) ×3 IMPLANT
SLEEVE SCD COMPRESS KNEE MED (MISCELLANEOUS) ×3 IMPLANT
SPONGE LAP 4X18 RFD (DISPOSABLE) IMPLANT
SUT MNCRL AB 4-0 PS2 18 (SUTURE) ×3 IMPLANT
SUT NOVA 0 T19/GS 22DT (SUTURE) IMPLANT
SUT NOVA NAB DX-16 0-1 5-0 T12 (SUTURE) ×3 IMPLANT
SUT NOVA NAB GS-21 1 T12 (SUTURE) IMPLANT
SUT VIC AB 2-0 SH 27 (SUTURE)
SUT VIC AB 2-0 SH 27XBRD (SUTURE) IMPLANT
SUT VIC AB 3-0 SH 27 (SUTURE) ×3
SUT VIC AB 3-0 SH 27X BRD (SUTURE) ×1 IMPLANT
SYR CONTROL 10ML LL (SYRINGE) ×3 IMPLANT
TOWEL GREEN STERILE FF (TOWEL DISPOSABLE) ×3 IMPLANT
TUBE CONNECTING 20'X1/4 (TUBING)
TUBE CONNECTING 20X1/4 (TUBING) IMPLANT
YANKAUER SUCT BULB TIP NO VENT (SUCTIONS) IMPLANT

## 2020-06-17 NOTE — Anesthesia Procedure Notes (Signed)
Procedure Name: LMA Insertion Date/Time: 06/17/2020 8:05 AM Performed by: Lavonia Dana, CRNA Pre-anesthesia Checklist: Patient identified, Emergency Drugs available, Suction available and Patient being monitored Patient Re-evaluated:Patient Re-evaluated prior to induction Oxygen Delivery Method: Circle system utilized Preoxygenation: Pre-oxygenation with 100% oxygen Induction Type: IV induction Ventilation: Mask ventilation without difficulty LMA: LMA inserted LMA Size: 5.0 Number of attempts: 1 Airway Equipment and Method: Bite block Placement Confirmation: positive ETCO2 Tube secured with: Tape Dental Injury: Teeth and Oropharynx as per pre-operative assessment

## 2020-06-17 NOTE — Anesthesia Preprocedure Evaluation (Signed)
Anesthesia Evaluation  Patient identified by MRN, date of birth, ID band Patient awake  General Assessment Comment:Urinary retention with hernia repair, no other surgeries  Reviewed: Allergy & Precautions, NPO status , Patient's Chart, lab work & pertinent test results, reviewed documented beta blocker date and time   History of Anesthesia Complications (+) history of anesthetic complications  Airway Mallampati: II  TM Distance: >3 FB Neck ROM: Full    Dental no notable dental hx. (+) Teeth Intact, Dental Advisory Given   Pulmonary neg pulmonary ROS,    Pulmonary exam normal breath sounds clear to auscultation       Cardiovascular hypertension, Pt. on medications and Pt. on home beta blockers Normal cardiovascular exam Rhythm:Regular Rate:Normal     Neuro/Psych PSYCHIATRIC DISORDERS Depression negative neurological ROS     GI/Hepatic negative GI ROS, Neg liver ROS,   Endo/Other  Hypothyroidism Obesity BMI 35  Renal/GU negative Renal ROS  negative genitourinary   Musculoskeletal  (+) Arthritis , Osteoarthritis,    Abdominal (+) + obese,   Peds negative pediatric ROS (+)  Hematology negative hematology ROS (+) plt 299   Anesthesia Other Findings   Reproductive/Obstetrics negative OB ROS                             Anesthesia Physical  Anesthesia Plan  ASA: II  Anesthesia Plan: General   Post-op Pain Management:    Induction: Intravenous  PONV Risk Score and Plan: 2 and Ondansetron, Midazolam and Treatment may vary due to age or medical condition  Airway Management Planned: LMA and Oral ETT  Additional Equipment: None  Intra-op Plan:   Post-operative Plan: Extubation in OR  Informed Consent: I have reviewed the patients History and Physical, chart, labs and discussed the procedure including the risks, benefits and alternatives for the proposed anesthesia with the patient or  authorized representative who has indicated his/her understanding and acceptance.       Plan Discussed with: CRNA  Anesthesia Plan Comments:         Anesthesia Quick Evaluation

## 2020-06-17 NOTE — Discharge Instructions (Signed)
[facility] _______[facility]  UMBILICAL OR INGUINAL HERNIA REPAIR: POST OP INSTRUCTIONS  Always review your discharge instruction sheet given to you by the facility where your surgery was performed. IF YOU HAVE DISABILITY OR FAMILY LEAVE FORMS, YOU MUST BRING THEM TO THE OFFICE FOR PROCESSING.   DO NOT GIVE THEM TO YOUR DOCTOR.  1. A  prescription for pain medication may be given to you upon discharge.  Take your pain medication as prescribed, if needed.  If narcotic pain medicine is not needed, then you may take acetaminophen (Tylenol) or ibuprofen (Advil) as needed. 2. Take your usually prescribed medications unless otherwise directed. If you need a refill on your pain medication, please contact your pharmacy.  They will contact our office to request authorization. Prescriptions will not be filled after 5 pm or on week-ends. 3. You should follow a light diet the first 24 hours after arrival home, such as soup and crackers, etc.  Be sure to include lots of fluids daily.  Resume your normal diet the day after surgery. 4.Most patients will experience some swelling and bruising around the umbilicus or in the groin and scrotum.  Ice packs and reclining will help.  Swelling and bruising can take several days to resolve.  6. It is common to experience some constipation if taking pain medication after surgery.  Increasing fluid intake and taking a stool softener (such as Colace) will usually help or prevent this problem from occurring.  A mild laxative (Milk of Magnesia or Miralax) should be taken according to package directions if there are no bowel movements after 48 hours. 7. Unless discharge instructions indicate otherwise, you may remove your bandages 24-48 hours after surgery, and you may shower at that time.  You may have steri-strips (small skin tapes) in place directly over the incision.  These strips should be left on the skin for 7-10 days.  If your surgeon used skin glue on the  incision, you may shower in 24 hours.  The glue will flake off over the next 2-3 weeks.  Any sutures or staples will be removed at the office during your follow-up visit. 8. ACTIVITIES:  You may resume regular (light) daily activities beginning the next day--such as daily self-care, walking, climbing stairs--gradually increasing activities as tolerated.  You may have sexual intercourse when it is comfortable.  Refrain from any heavy lifting or straining until approved by your doctor.  a.You may drive when you are no longer taking prescription pain medication, you can comfortably wear a seatbelt, and you can safely maneuver your car and apply brakes. b.RETURN TO WORK:   _____________________________________________  9.You should see your doctor in the office for a follow-up appointment approximately 2-3 weeks after your surgery.  Make sure that you call for this appointment within a day or two after you arrive home to insure a convenient appointment time. 10.OTHER INSTRUCTIONS: _NO LIFTING MORE THAN 15 POUNDS FOR 4 WEEKS OK TO SHOWER STARTING TOMORROW ICE PACK, TYLENOL, AND IBUPROFEN ALSO FOR PAIN _______________________    _____________________________________  WHEN TO CALL YOUR DOCTOR: 1. Fever over 101.0 2. Inability to urinate 3. Nausea and/or vomiting 4. Extreme swelling or bruising 5. Continued bleeding from incision. 6. Increased pain, redness, or drainage from the incision  The clinic staff is available to answer your questions during regular business hours.  Please don't hesitate to call and ask to speak to one of the nurses for clinical concerns.  If you have a medical emergency, go to the  nearest emergency room or call 911.  A surgeon from [facility] is always on call at the hospital   [address], [place], [place], [place]  [address] ?  [address], [place], [place]   [address] [phone] ? [phone] ? FAX [phone] Web site:  [email or web address]  No Tylenol until 1pm   Post Anesthesia Home Care Instructions  Activity: Get plenty of rest for the remainder of the day. A responsible individual must stay with you for 24 hours following the procedure.  For the next 24 hours, DO NOT: -Drive a car -Paediatric nurse -Drink alcoholic beverages -Take any medication unless instructed by your physician -Make any legal decisions or sign important papers.  Meals: Start with liquid foods such as gelatin or soup. Progress to regular foods as tolerated. Avoid greasy, spicy, heavy foods. If nausea and/or vomiting occur, drink only clear liquids until the nausea and/or vomiting subsides. Call your physician if vomiting continues.  Special Instructions/Symptoms: Your throat may feel dry or sore from the anesthesia or the breathing tube placed in your throat during surgery. If this causes discomfort, gargle with warm salt water. The discomfort should disappear within 24 hours.  If you had a scopolamine patch placed behind your ear for the management of post- operative nausea and/or vomiting:  1. The medication in the patch is effective for 72 hours, after which it should be removed.  Wrap patch in a tissue and discard in the trash. Wash hands thoroughly with soap and water. 2. You may remove the patch earlier than 72 hours if you experience unpleasant side effects which may include dry mouth, dizziness or visual disturbances. 3. Avoid touching the patch. Wash your hands with soap and water after contact with the patch.

## 2020-06-17 NOTE — Transfer of Care (Signed)
Immediate Anesthesia Transfer of Care Note  Patient: Robert Blair  Procedure(s) Performed: UMBILICAL HERNIA REPAIR (N/A Abdomen) INSERTION OF MESH (N/A Abdomen)  Patient Location: PACU  Anesthesia Type:General  Level of Consciousness: drowsy, patient cooperative and responds to stimulation  Airway & Oxygen Therapy: Patient Spontanous Breathing and Patient connected to face mask oxygen  Post-op Assessment: Report given to RN and Post -op Vital signs reviewed and stable  Post vital signs: Reviewed and stable  Last Vitals:  Vitals Value Taken Time  BP    Temp    Pulse 72 06/17/20 0851  Resp 11 06/17/20 0851  SpO2 97 % 06/17/20 0851  Vitals shown include unvalidated device data.  Last Pain:  Vitals:   06/17/20 0640  TempSrc: Oral  PainSc: 0-No pain         Complications: No complications documented.

## 2020-06-17 NOTE — Anesthesia Postprocedure Evaluation (Signed)
Anesthesia Post Note  Patient: Robert Blair  Procedure(s) Performed: UMBILICAL HERNIA REPAIR (N/A Abdomen) INSERTION OF MESH (N/A Abdomen)     Patient location during evaluation: PACU Anesthesia Type: General Level of consciousness: awake and alert Pain management: pain level controlled Vital Signs Assessment: post-procedure vital signs reviewed and stable Respiratory status: spontaneous breathing, nonlabored ventilation and respiratory function stable Cardiovascular status: blood pressure returned to baseline and stable Postop Assessment: no apparent nausea or vomiting Anesthetic complications: no   No complications documented.  Last Vitals:  Vitals:   06/17/20 0930 06/17/20 0959  BP: 93/63 110/62  Pulse: 60 71  Resp: 12 16  Temp:  37.1 C  SpO2: 95% 95%    Last Pain:  Vitals:   06/17/20 0959  TempSrc:   PainSc: 2                  Lynda Rainwater

## 2020-06-17 NOTE — Op Note (Signed)
Operative Note  Robert Blair  [id number]  [id number]  06/17/2020   Surgeon: Junie Panning  Assistant: Ahmed Prima, MD [facility]  Procedure performed: Open supra-umbilical and umbilical hernia with mesh  Preop diagnosis: Umbilical and supra-umbilical hernia Post-op diagnosis/intraop findings: Umbilical and supra-umbilical hernia  Specimens: None Retained items: Mesh EBL: 5 cc Complications: none  Description of procedure: After obtaining informed consent the patient was taken to the operating room and placed supine on operating room table wheregeneral anesthesia with LMA was initiated, preoperative antibiotics were administered, SCDs applied, and a formal timeout was performed.   A 3cm supraumbilical incision was made and carried down to the hernia defect and fascia with blunt and electrocautery. The hernia edges were freed from the fascia and divided. There were two defects noted, one 2cm supraumbilical defect and one 1cm umbilical defect.   A 6cm Bard mesh was placed in the abdomen through the larger, supraumbilical defect, and secured in place with four 1-0 Novafil sutures. The umbilical fascial defect was sutured closed with a figure of eight 1-0 Novafil, then the supraumbilical defect was sutured closed with a figure of eight 1-0 Novafil. The subcutaneous space was closed with three 3-0 Vicryl sutures. Then the skin was closed with a running 4-0 Biosyn and skin glue.  The patient was then awakened, extubated and taken to PACU in stable condition. All counts were correct at the completion of the case. Dr. Ninfa Linden was present for all portions of the case.   Ahmed Prima, MD [facility]

## 2020-06-17 NOTE — Interval H&P Note (Signed)
History and Physical Interval Note:no change in H and P  06/17/2020 7:07 AM  Robert Blair  has presented today for surgery, with the diagnosis of UMBILICAL HERNIA.  The various methods of treatment have been discussed with the patient and family. After consideration of risks, benefits and other options for treatment, the patient has consented to  Procedure(s): UMBILICAL HERNIA REPAIR WITH MESH (N/A) as a surgical intervention.  The patient's history has been reviewed, patient examined, no change in status, stable for surgery.  I have reviewed the patient's chart and labs.  Questions were answered to the patient's satisfaction.     Coralie Keens

## 2020-06-18 ENCOUNTER — Encounter (HOSPITAL_BASED_OUTPATIENT_CLINIC_OR_DEPARTMENT_OTHER): Payer: Self-pay | Admitting: Surgery

## 2020-06-18 NOTE — Addendum Note (Signed)
Addendum  created 06/18/20 0931 by Maryella Shivers, CRNA   Charge Capture section accepted

## 2020-07-24 ENCOUNTER — Ambulatory Visit: Payer: BC Managed Care – PPO | Admitting: Orthopaedic Surgery

## 2020-07-30 ENCOUNTER — Ambulatory Visit (INDEPENDENT_AMBULATORY_CARE_PROVIDER_SITE_OTHER): Payer: BC Managed Care – PPO | Admitting: Orthopaedic Surgery

## 2020-07-30 ENCOUNTER — Other Ambulatory Visit: Payer: Self-pay

## 2020-07-30 ENCOUNTER — Encounter: Payer: Self-pay | Admitting: Orthopaedic Surgery

## 2020-07-30 ENCOUNTER — Ambulatory Visit: Payer: Self-pay

## 2020-07-30 VITALS — Ht 67.0 in | Wt 215.0 lb

## 2020-07-30 DIAGNOSIS — G8929 Other chronic pain: Secondary | ICD-10-CM | POA: Diagnosis not present

## 2020-07-30 DIAGNOSIS — M25511 Pain in right shoulder: Secondary | ICD-10-CM

## 2020-07-30 DIAGNOSIS — M79672 Pain in left foot: Secondary | ICD-10-CM

## 2020-07-30 DIAGNOSIS — M2022 Hallux rigidus, left foot: Secondary | ICD-10-CM | POA: Diagnosis not present

## 2020-07-30 NOTE — Progress Notes (Signed)
Office Visit Note   Patient: Robert Blair           Date of Birth: 1955-11-23           MRN: [id number] Visit Date: 07/30/2020              Requested by: [name], [address], [place],  [place] [address] PCP: [name], MD   Assessment & Plan: Visit Diagnoses:  1. Pain in left foot   2. Chronic right shoulder pain   3. Hallux rigidus, left foot     Plan: Mr. Scogin is scheduled to have fusion of the right great toe by Dr. Sharol Given sometime this year.  He came to the office today to discuss a problem is having with his left foot and right shoulder.  He has a similar problem on the left great toes that he does on the right with hallux rigidus and advanced osteoarthritis with normal alignment.  We will asked Dr. Sharol Given to evaluate him for subsequent fusion on that toe as well.  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  He has had a prior biceps tendon tear on the left with subsequent "Popeye" arm but without any subsequent pain.  He does have a positive Speed sign.  We will just monitor this over time and if no improvement consider cortisone or MRI scan  Follow-Up Instructions: Return if symptoms worsen or fail to improve.   Orders:  Orders Placed This Encounter  Procedures  . XR Shoulder Right  . XR Foot Complete Left   No orders of the defined types were placed in this encounter.     Procedures: No procedures performed   Clinical Data: No additional findings.   Subjective: Chief Complaint  Patient presents with  . Right Shoulder - Pain  . Left Foot - Pain  Patient presents today for his left foot. He states that his left great toe has been hurting for quite some time. He had surgery with Dr.Jesscia Imm around 4years ago on that same toe. He said that his toe gets very stiff if he walks. He is preparing to have his right great toe surgically fused with Dr.Duda next month.  He also has pain in his right shoulder. No known injury.  His pain is located in his upper right arm when he rotates his arm away from his body. He has good range of motion.  Has had a prior biceps tendon tear left shoulder treated nonoperatively .he is wanting to get a script for oxycodone to take for pain.   HPI  Review of Systems   Objective: Vital Signs: Ht 5\' 7"  (1.702 m)   Wt 215 lb (97.5 kg)   BMI 33.67 kg/m   Physical Exam Constitutional:      Appearance: He is well-developed.  Eyes:     Pupils: Pupils are equal, round, and reactive to light.  Pulmonary:     Effort: Pulmonary effort is normal.  Skin:    General: Skin is warm and dry.  Neurological:     Mental Status: He is alert and oriented to person, place, and time.  Psychiatric:        Behavior: Behavior normal.     Ortho Exam awake alert and oriented x3.  Comfortable sitting positive Speed sign right shoulder.  Negative impingement empty can testing.  Biceps appears to be in its normal position.  No pain at the [facility] joint or the subacromial region.  Right  foot with little if any motion across the metatarsal phalangeal joint of the great toe.  Normal alignment.  Good capillary refill.  Neurologically intact.  Hypertrophic changes about the metatarsal phalangeal joint consistent with osteophytes.  Mild pain with any attempted motion  Specialty Comments:  No specialty comments available.  Imaging: XR Foot Complete Left  Result Date: 07/30/2020 Films of the left foot were obtained in several projections.  There are is evidence of hallux rigidus clinically.  Films demonstrate narrowing of the great toe metatarsal phalangeal joint with normal alignment.  There are osteophytes medially laterally and dorsally.  There is subchondral sclerosis.  Films are consistent with advanced osteoarthritis.  XR Shoulder Right  Result Date: 07/30/2020 Numbness of the right shoulder obtained in 3 projections.  Humeral head is centered about the glenoid.  Normal space between the humeral head  and the acromion.  No ectopic calcification or acute changes.  There are degenerative changes at the [facility] joint    PMFS History: Patient Active Problem List   Diagnosis Date Noted  . Hallux rigidus, left foot 07/30/2020  . Pain in right shoulder 07/30/2020  . Great toe pain, right 05/09/2020  . 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  . Unilateral primary osteoarthritis, left hip 09/22/2019  . Tendinitis, de Quervain's 06/02/2019  . Pain of left hip joint 02/07/2019  . Recurrent right inguinal hernia 10/29/2014  . Hypertension 01/01/2012  . Hypothyroidism 01/01/2012  . History of adenomatous polyp of colon 01/01/2012   Past Medical History:  Diagnosis Date  . Arthritis   . Cancer ([place]) 2016   Basal cell  . Complication of anesthesia    URINARY RETENTION  . Depression   . History of adenomatous polyp of colon    2008/  2013/  2016  . History of basal cell carcinoma excision    2015  nose  . History of urinary retention    10-30-2014  POST OP  . Hypothyroidism   . Seasonal allergies   . Unspecified essential hypertension     Family History  Problem Relation Age of Onset  . Breast cancer Mother   . Hypertension Mother   . Depression Mother   . Cancer - Other Mother        unknown GI cancer  . Colon polyps Father   . Stroke Father   . Alzheimer's disease Father   . Colon cancer Neg Hx   . Rectal cancer Neg Hx   . Stomach cancer Neg Hx   . Liver disease Neg Hx     Past Surgical History:  Procedure Laterality Date  . COLONOSCOPY  last one 02-25-2015  . HYDROCELE EXCISION Right 08/26/2015   Procedure: RIGHT HYDROCELECTOMY ADULT;  Surgeon: Franchot Gallo, MD;  Location: [facility];  Service: Urology;  Laterality: Right;  . INGUINAL HERNIA REPAIR Right 10/29/2014   Procedure: LAPAROSCOPIC REPAIR RECURRENT RIGHT INGUINAL ;  Surgeon: Fanny Skates, MD;  Location: [facility];  Service: General;  Laterality: Right;  . INGUINAL HERNIA REPAIR Right 1984  .  INSERTION OF MESH Right 10/29/2014   Procedure: INSERTION OF MESH;  Surgeon: Fanny Skates, MD;  Location: [facility];  Service: General;  Laterality: Right;  . INSERTION OF MESH N/A 06/17/2020   Procedure: INSERTION OF MESH;  Surgeon: Coralie Keens, MD;  Location: [facility];  Service: General;  Laterality: N/A;  . POLYPECTOMY    . TOE OSTEOTOMY Bilateral right 2002/  left 2013   great toe  . TOTAL  HIP ARTHROPLASTY Left 09/22/2019   Procedure: LEFT TOTAL HIP ARTHROPLASTY ANTERIOR APPROACH;  Surgeon: Mcarthur Rossetti, MD;  Location: [facility];  Service: Orthopedics;  Laterality: Left;  . UMBILICAL HERNIA REPAIR N/A 06/17/2020   Procedure: UMBILICAL HERNIA REPAIR;  Surgeon: Coralie Keens, MD;  Location: [facility];  Service: General;  Laterality: N/A;   Social History   Occupational History  . Occupation: self employed  Tobacco Use  . Smoking status: Never Smoker  . Smokeless tobacco: Never Used  Vaping Use  . Vaping Use: Never used  Substance and Sexual Activity  . Alcohol use: Yes    Comment: social  . Drug use: No  . Sexual activity: Yes

## 2020-08-01 ENCOUNTER — Other Ambulatory Visit: Payer: Self-pay

## 2020-08-02 ENCOUNTER — Other Ambulatory Visit: Payer: Self-pay

## 2020-08-02 ENCOUNTER — Encounter (HOSPITAL_BASED_OUTPATIENT_CLINIC_OR_DEPARTMENT_OTHER): Payer: Self-pay | Admitting: Orthopedic Surgery

## 2020-08-05 ENCOUNTER — Telehealth: Payer: Self-pay

## 2020-08-05 NOTE — Telephone Encounter (Signed)
Patient is having surgery he wants to know if he needs a scooter if so he is requesting a Rx for it. He would like a CB:[phone]

## 2020-08-05 NOTE — Telephone Encounter (Signed)
Pt is sch for right GT fusion knee scooter not covered by insurance but will write rx to see if he can file.

## 2020-08-06 NOTE — Telephone Encounter (Signed)
I had called pt this afternoon to advise that the rx is available for pick up. To call with any other questions.

## 2020-08-07 ENCOUNTER — Other Ambulatory Visit: Payer: Self-pay | Admitting: Physician Assistant

## 2020-08-09 ENCOUNTER — Other Ambulatory Visit (HOSPITAL_COMMUNITY)
Admission: RE | Admit: 2020-08-09 | Discharge: 2020-08-09 | Disposition: A | Discharge status: Home / Self Care | Payer: BC Managed Care – PPO | Source: Ambulatory Visit | Attending: Orthopedic Surgery | Admitting: Orthopedic Surgery

## 2020-08-09 DIAGNOSIS — Z20822 Contact with and (suspected) exposure to covid-19: Secondary | ICD-10-CM | POA: Insufficient documentation

## 2020-08-09 DIAGNOSIS — Z01812 Encounter for preprocedural laboratory examination: Secondary | ICD-10-CM | POA: Diagnosis not present

## 2020-08-09 LAB — SARS CORONAVIRUS 2 (TAT 6-24 HRS): SARS Coronavirus 2: NEGATIVE

## 2020-08-12 ENCOUNTER — Encounter (HOSPITAL_BASED_OUTPATIENT_CLINIC_OR_DEPARTMENT_OTHER)
Admission: RE | Admit: 2020-08-12 | Discharge: 2020-08-12 | Disposition: A | Discharge status: Home / Self Care | Payer: BC Managed Care – PPO | Source: Ambulatory Visit | Attending: Orthopedic Surgery | Admitting: Orthopedic Surgery

## 2020-08-12 DIAGNOSIS — M2021 Hallux rigidus, right foot: Secondary | ICD-10-CM | POA: Diagnosis not present

## 2020-08-12 DIAGNOSIS — Z96642 Presence of left artificial hip joint: Secondary | ICD-10-CM | POA: Diagnosis not present

## 2020-08-12 DIAGNOSIS — Z79899 Other long term (current) drug therapy: Secondary | ICD-10-CM | POA: Diagnosis not present

## 2020-08-12 DIAGNOSIS — Z79890 Hormone replacement therapy (postmenopausal): Secondary | ICD-10-CM | POA: Diagnosis not present

## 2020-08-12 LAB — BASIC METABOLIC PANEL WITH GFR
Anion gap: 9 (ref 5–15)
BUN: 14 mg/dL (ref 8–23)
CO2: 30 mmol/L (ref 22–32)
Calcium: 9.3 mg/dL (ref 8.9–10.3)
Chloride: 102 mmol/L (ref 98–111)
Creatinine, Ser: 1.06 mg/dL (ref 0.61–1.24)
GFR, Estimated: >60 mL/min
Glucose, Bld: 112 mg/dL — ABNORMAL HIGH (ref 70–99)
Potassium: 3.3 mmol/L — ABNORMAL LOW (ref 3.5–5.1)
Sodium: 141 mmol/L (ref 135–145)

## 2020-08-12 NOTE — Progress Notes (Signed)

## 2020-08-13 ENCOUNTER — Encounter (HOSPITAL_BASED_OUTPATIENT_CLINIC_OR_DEPARTMENT_OTHER)
Admission: RE | Disposition: A | Discharge status: Home / Self Care | Payer: Self-pay | Source: Home / Self Care | Attending: Orthopedic Surgery

## 2020-08-13 ENCOUNTER — Other Ambulatory Visit: Payer: Self-pay

## 2020-08-13 ENCOUNTER — Encounter (HOSPITAL_BASED_OUTPATIENT_CLINIC_OR_DEPARTMENT_OTHER): Payer: Self-pay | Admitting: Orthopedic Surgery

## 2020-08-13 ENCOUNTER — Ambulatory Visit (HOSPITAL_BASED_OUTPATIENT_CLINIC_OR_DEPARTMENT_OTHER)
Admission: RE | Admit: 2020-08-13 | Discharge: 2020-08-13 | Disposition: A | Discharge status: Home / Self Care | Payer: BC Managed Care – PPO | Attending: Orthopedic Surgery | Admitting: Orthopedic Surgery

## 2020-08-13 ENCOUNTER — Ambulatory Visit (HOSPITAL_BASED_OUTPATIENT_CLINIC_OR_DEPARTMENT_OTHER): Payer: BC Managed Care – PPO | Admitting: Certified Registered Nurse Anesthetist

## 2020-08-13 DIAGNOSIS — T85848A Pain due to other internal prosthetic devices, implants and grafts, initial encounter: Secondary | ICD-10-CM

## 2020-08-13 DIAGNOSIS — T85848S Pain due to other internal prosthetic devices, implants and grafts, sequela: Secondary | ICD-10-CM | POA: Diagnosis not present

## 2020-08-13 DIAGNOSIS — M2021 Hallux rigidus, right foot: Secondary | ICD-10-CM | POA: Diagnosis not present

## 2020-08-13 DIAGNOSIS — Z96642 Presence of left artificial hip joint: Secondary | ICD-10-CM | POA: Insufficient documentation

## 2020-08-13 DIAGNOSIS — Z79899 Other long term (current) drug therapy: Secondary | ICD-10-CM | POA: Insufficient documentation

## 2020-08-13 DIAGNOSIS — Z79890 Hormone replacement therapy (postmenopausal): Secondary | ICD-10-CM | POA: Insufficient documentation

## 2020-08-13 HISTORY — PX: ARTHRODESIS METATARSALPHALANGEAL JOINT (MTPJ): SHX6566

## 2020-08-13 SURGERY — FUSION, JOINT, GREAT TOE
Anesthesia: General | Site: Toe | Laterality: Right

## 2020-08-13 MED ORDER — FENTANYL CITRATE (PF) 100 MCG/2ML IJ SOLN
INTRAMUSCULAR | Status: DC | PRN
Start: 2020-08-13 — End: 2020-08-13
  Administered 2020-08-13: 100 ug via INTRAVENOUS

## 2020-08-13 MED ORDER — CEFAZOLIN SODIUM-DEXTROSE 2-4 GM/100ML-% IV SOLN
2.0000 g | INTRAVENOUS | Status: AC
  Administered 2020-08-13: 2 g via INTRAVENOUS

## 2020-08-13 MED ORDER — FENTANYL CITRATE (PF) 100 MCG/2ML IJ SOLN
INTRAMUSCULAR | Status: AC
  Filled 2020-08-13: qty 2

## 2020-08-13 MED ORDER — DEXMEDETOMIDINE (PRECEDEX) IN NS 20 MCG/5ML (4 MCG/ML) IV SYRINGE
PREFILLED_SYRINGE | INTRAVENOUS | Status: DC | PRN
  Administered 2020-08-13 (×5): 8 ug via INTRAVENOUS

## 2020-08-13 MED ORDER — HYDROMORPHONE HCL 1 MG/ML IJ SOLN: 0.2500 mg | INTRAMUSCULAR | Status: DC | PRN

## 2020-08-13 MED ORDER — CEFAZOLIN SODIUM-DEXTROSE 2-4 GM/100ML-% IV SOLN
INTRAVENOUS | Status: AC
  Filled 2020-08-13: qty 100

## 2020-08-13 MED ORDER — PROPOFOL 10 MG/ML IV BOLUS
INTRAVENOUS | Status: AC
  Filled 2020-08-13: qty 20

## 2020-08-13 MED ORDER — BUPIVACAINE HCL (PF) 0.5 % IJ SOLN
INTRAMUSCULAR | Status: DC | PRN
  Administered 2020-08-13: 30 mL

## 2020-08-13 MED ORDER — LACTATED RINGERS IV SOLN: INTRAVENOUS | Status: DC

## 2020-08-13 MED ORDER — LIDOCAINE HCL (CARDIAC) PF 100 MG/5ML IV SOSY
PREFILLED_SYRINGE | INTRAVENOUS | Status: DC | PRN
  Administered 2020-08-13: 100 mg via INTRATRACHEAL

## 2020-08-13 MED ORDER — HYDROMORPHONE HCL 1 MG/ML IJ SOLN
INTRAMUSCULAR | Status: AC
  Filled 2020-08-13: qty 0.5

## 2020-08-13 MED ORDER — GLYCOPYRROLATE PF 0.2 MG/ML IJ SOSY
PREFILLED_SYRINGE | INTRAMUSCULAR | Status: AC
  Filled 2020-08-13: qty 1

## 2020-08-13 MED ORDER — MIDAZOLAM HCL 2 MG/2ML IJ SOLN
INTRAMUSCULAR | Status: AC
  Filled 2020-08-13: qty 2

## 2020-08-13 MED ORDER — OXYCODONE-ACETAMINOPHEN 5-325 MG PO TABS: 1.0000 | ORAL_TABLET | ORAL | 0 refills | Status: DC | PRN

## 2020-08-13 MED ORDER — LIDOCAINE 2% (20 MG/ML) 5 ML SYRINGE
INTRAMUSCULAR | Status: AC
  Filled 2020-08-13: qty 5

## 2020-08-13 MED ORDER — EPHEDRINE 5 MG/ML INJ
INTRAVENOUS | Status: AC
  Filled 2020-08-13: qty 10

## 2020-08-13 MED ORDER — OXYCODONE HCL 5 MG/5ML PO SOLN: 5.0000 mg | Freq: Once | ORAL | Status: DC | PRN

## 2020-08-13 MED ORDER — MIDAZOLAM HCL 2 MG/2ML IJ SOLN
INTRAMUSCULAR | Status: DC | PRN
  Administered 2020-08-13: 2 mg via INTRAVENOUS

## 2020-08-13 MED ORDER — 0.9 % SODIUM CHLORIDE (POUR BTL) OPTIME
TOPICAL | Status: DC | PRN
  Administered 2020-08-13: 1000 mL

## 2020-08-13 MED ORDER — PROMETHAZINE HCL 25 MG/ML IJ SOLN: 6.2500 mg | INTRAMUSCULAR | Status: DC | PRN

## 2020-08-13 MED ORDER — EPHEDRINE SULFATE 50 MG/ML IJ SOLN
INTRAMUSCULAR | Status: DC | PRN
  Administered 2020-08-13 (×2): 10 mg via INTRAVENOUS

## 2020-08-13 MED ORDER — PROPOFOL 10 MG/ML IV BOLUS
INTRAVENOUS | Status: DC | PRN
  Administered 2020-08-13: 200 mg via INTRAVENOUS
  Administered 2020-08-13 (×2): 50 mg via INTRAVENOUS

## 2020-08-13 MED ORDER — ONDANSETRON HCL 4 MG/2ML IJ SOLN
INTRAMUSCULAR | Status: AC
  Filled 2020-08-13: qty 2

## 2020-08-13 MED ORDER — DEXAMETHASONE SODIUM PHOSPHATE 10 MG/ML IJ SOLN
INTRAMUSCULAR | Status: DC | PRN
  Administered 2020-08-13: 5 mg via INTRAVENOUS

## 2020-08-13 MED ORDER — DEXMEDETOMIDINE (PRECEDEX) IN NS 20 MCG/5ML (4 MCG/ML) IV SYRINGE
PREFILLED_SYRINGE | INTRAVENOUS | Status: AC
  Filled 2020-08-13: qty 10

## 2020-08-13 MED ORDER — ONDANSETRON HCL 4 MG/2ML IJ SOLN
INTRAMUSCULAR | Status: DC | PRN
  Administered 2020-08-13: 4 mg via INTRAVENOUS

## 2020-08-13 MED ORDER — OXYCODONE HCL 5 MG PO TABS: 5.0000 mg | ORAL_TABLET | Freq: Once | ORAL | Status: DC | PRN

## 2020-08-13 SURGICAL SUPPLY — 60 items
BIT DRILL SOLID 2.0 X 110MM (DRILL) ×1 IMPLANT
BLADE OSC/SAG .038X5.5 CUT EDG (BLADE) ×3 IMPLANT
BLADE SURG 15 STRL LF DISP TIS (BLADE) ×2 IMPLANT
BLADE SURG 15 STRL SS (BLADE) ×6
BNDG CMPR 9X4 STRL LF SNTH (GAUZE/BANDAGES/DRESSINGS) ×1
BNDG COHESIVE 3X5 TAN STRL LF (GAUZE/BANDAGES/DRESSINGS) ×3 IMPLANT
BNDG COHESIVE 4X5 TAN STRL (GAUZE/BANDAGES/DRESSINGS) ×3 IMPLANT
BNDG ESMARK 4X9 LF (GAUZE/BANDAGES/DRESSINGS) ×3 IMPLANT
BNDG GAUZE ELAST 4 BULKY (GAUZE/BANDAGES/DRESSINGS) ×3 IMPLANT
COVER BACK TABLE 60X90IN (DRAPES) ×3 IMPLANT
COVER SURGICAL LIGHT HANDLE (MISCELLANEOUS) ×3 IMPLANT
COVER WAND RF STERILE (DRAPES) IMPLANT
DECANTER SPIKE VIAL GLASS SM (MISCELLANEOUS) IMPLANT
DRAPE EXTREMITY T 121X128X90 (DISPOSABLE) ×3 IMPLANT
DRAPE OEC MINIVIEW 54X84 (DRAPES) ×3 IMPLANT
DRAPE U 60X70 (DRAPES) ×3 IMPLANT
DRAPE U-SHAPE 47X51 STRL (DRAPES) ×3 IMPLANT
DRILL SOLID 2.0 X 110MM (DRILL) ×3
DRSG EMULSION OIL 3X3 NADH (GAUZE/BANDAGES/DRESSINGS) ×3 IMPLANT
DRSG PAD ABDOMINAL 8X10 ST (GAUZE/BANDAGES/DRESSINGS) ×3 IMPLANT
DURAPREP 26ML APPLICATOR (WOUND CARE) ×3 IMPLANT
ELECT REM PT RETURN 9FT ADLT (ELECTROSURGICAL) ×3
ELECTRODE REM PT RTRN 9FT ADLT (ELECTROSURGICAL) ×1 IMPLANT
GAUZE 4X4 16PLY RFD (DISPOSABLE) IMPLANT
GAUZE SPONGE 4X4 12PLY STRL (GAUZE/BANDAGES/DRESSINGS) ×3 IMPLANT
GLOVE BIOGEL PI IND STRL 9 (GLOVE) ×1 IMPLANT
GLOVE BIOGEL PI INDICATOR 9 (GLOVE) ×2
GLOVE SURG ORTHO 9.0 STRL STRW (GLOVE) ×3 IMPLANT
GOWN STRL REUS W/ TWL LRG LVL3 (GOWN DISPOSABLE) ×1 IMPLANT
GOWN STRL REUS W/TWL LRG LVL3 (GOWN DISPOSABLE) ×3
GOWN STRL REUS W/TWL XL LVL3 (GOWN DISPOSABLE) ×3 IMPLANT
K-WIRE SMOOTH 1.6X150MM (WIRE) ×6
K-WIRE SNGL END 1.2X150 (MISCELLANEOUS) ×3
KWIRE SMOOTH 1.6X150MM (WIRE) ×2 IMPLANT
KWIRE SNGL END 1.2X150 (MISCELLANEOUS) ×1 IMPLANT
NEEDLE HYPO 25X1 1.5 SAFETY (NEEDLE) ×3 IMPLANT
NS IRRIG 1000ML POUR BTL (IV SOLUTION) ×3 IMPLANT
PACK BASIN DAY SURGERY FS (CUSTOM PROCEDURE TRAY) ×3 IMPLANT
PAD CAST 4YDX4 CTTN HI CHSV (CAST SUPPLIES) IMPLANT
PADDING CAST COTTON 4X4 STRL (CAST SUPPLIES)
PENCIL SMOKE EVACUATOR (MISCELLANEOUS) ×3 IMPLANT
PLATE LAPIDUS 3H STD RT (Plate) ×3 IMPLANT
SCREW 4.0 X 26 ST (Screw) ×3 IMPLANT
SCREW LOCK PLATE R3 2.7X14 (Screw) ×6 IMPLANT
SCREW LOCK PLATE R3 2.7X16 (Screw) ×6 IMPLANT
SCREW LOCK PLATE R3 2.7X18 (Screw) ×3 IMPLANT
SCREW NL 12X2.7XNS GORILLA R (Screw) ×1 IMPLANT
SCREW NL 2.7X12 (Screw) ×3 IMPLANT
SCREW NON LOCK PLATE 2.7X16M (Screw) ×3 IMPLANT
SCREW NON LOCKING 2.7X18 (Screw) ×3 IMPLANT
SPONGE LAP 18X18 RF (DISPOSABLE) ×3 IMPLANT
STOCKINETTE 6  STRL (DRAPES) ×2
STOCKINETTE 6 STRL (DRAPES) ×1 IMPLANT
SUT ETHILON 2 0 FSLX (SUTURE) ×3 IMPLANT
SUT ETHILON 3 0 FSL (SUTURE) IMPLANT
SUT VIC AB 2-0 CT1 27 (SUTURE) ×3
SUT VIC AB 2-0 CT1 TAPERPNT 27 (SUTURE) ×1 IMPLANT
SYR BULB EAR ULCER 3OZ GRN STR (SYRINGE) ×3 IMPLANT
SYR CONTROL 10ML LL (SYRINGE) ×3 IMPLANT
TOWEL GREEN STERILE FF (TOWEL DISPOSABLE) ×6 IMPLANT

## 2020-08-13 NOTE — Op Note (Addendum)
08/13/2020  10:00 AM  PATIENT:  Robert Blair    PRE-OPERATIVE DIAGNOSIS:  Hallux Rigidus Right Great Toe Metatarsophalangeal Joint Deep retained hardware from previous bunion surgery  POST-OPERATIVE DIAGNOSIS:  Same  PROCEDURE:  FUSION RIGHT GREAT TOE METATARSALPHALANGEAL JOINT (MTPJ) C-arm fluoroscopy to verify reduction. Removal of deep retained hardware.  SURGEON:  Newt Minion, MD  PHYSICIAN ASSISTANT:None ANESTHESIA:   General  PREOPERATIVE INDICATIONS:  Robert Blair is a  64 y.o. male with a diagnosis of Hallux Rigidus Right Great Toe Metatarsophalangeal Joint who failed conservative measures and elected for surgical management.    The risks benefits and alternatives were discussed with the patient preoperatively including but not limited to the risks of infection, bleeding, nerve injury, cardiopulmonary complications, the need for revision surgery, among others, and the patient was willing to proceed.  OPERATIVE IMPLANTS: [employer] MTP fusion plate 10 degrees dorsiflexion  @ENCIMAGES @  OPERATIVE FINDINGS: Extensive osteonecrosis retained hardware and large osteophytic bone spurs.  Post reduction radiograph shows stable alignment in AP and lateral planes.  OPERATIVE PROCEDURE: Patient was brought the operating room and underwent a general anesthetic.  After adequate levels anesthesia were obtained patient's right lower extremity was prepped using DuraPrep draped into a sterile field a timeout was called.  A medial incision was made over the MTP joint and Esmarch was placed at the ankle for tourniquet control the retinaculum was incised and elevated.  Patient had massive bone spurs dorsal medial lateral and plantar.  A oscillating saw was used to remove the bone spurs rondure was then used to further debride the bone spurs.  There was a mini frag screw stabilizing the old bunion surgery and the deep screw was removed.  A wire was inserted down the shaft of the metatarsal head  and a 20 mm cup reamer was used to ream back to bleeding viable subchondral bone a guidewire was then placed in the base of the proximal phalanx and the cone reamer was used 20 mm in diameter to ream back to bleeding viable subchondral bone the joint was reduced and stabilized with a K wire.  The dorsal plate was placed a compression screw was placed proximally and distally and then the proximal compression screw that was in the compression slot was used to initially compress the joint a oblique compression screw was then placed obliquely across the joint and was used to further compress the joint.  The remainder of the screw holes were filled with locking screws for a total of 3 screws proximally 3 screws distally and the interfrag compression screw.  The wound was irrigated normal saline C-arm fluoroscopy was used to verify reduction in both AP and lateral planes.  The Esmarch was released hemostasis was obtained the wound was further irrigated.  Patient underwent a local block with 30 cc of half percent Marcaine plain.  2-0 nylon was used to close the incision sterile dressing was applied patient was extubated taken the PACU in stable condition.   DISCHARGE PLANNING:  Antibiotic duration: Preoperative antibiotics  Weightbearing: Nonweightbearing on the right  Pain medication: Percocet  Dressing care/ Wound VAC: Follow-up in the office 1 week to change the dressing  Ambulatory devices: Crutches or kneeling scooter  Discharge to: Home.  Follow-up: In the office 1 week post operative.

## 2020-08-13 NOTE — Interval H&P Note (Signed)
History and Physical Interval Note:  08/13/2020 7:34 AM  Robert Blair  has presented today for surgery, with the diagnosis of Hallux Rigidus Right Great Toe Metatarsophalangeal Joint.  The various methods of treatment have been discussed with the patient and family. After consideration of risks, benefits and other options for treatment, the patient has consented to  Procedure(s): FUSION RIGHT GREAT TOE METATARSALPHALANGEAL JOINT (MTPJ) (Right) as a surgical intervention.  The patient's history has been reviewed, patient examined, no change in status, stable for surgery.  I have reviewed the patient's chart and labs.  Questions were answered to the patient's satisfaction.     Newt Minion

## 2020-08-13 NOTE — Anesthesia Procedure Notes (Signed)
Procedure Name: LMA Insertion Date/Time: 08/13/2020 9:02 AM Performed by: Collier Bullock, CRNA Pre-anesthesia Checklist: Patient identified, Emergency Drugs available, Suction available and Patient being monitored Patient Re-evaluated:Patient Re-evaluated prior to induction Oxygen Delivery Method: Circle system utilized Preoxygenation: Pre-oxygenation with 100% oxygen Induction Type: IV induction LMA: LMA inserted LMA Size: 4.0 Number of attempts: 1 Placement Confirmation: positive ETCO2 and breath sounds checked- equal and bilateral Tube secured with: Tape

## 2020-08-13 NOTE — Anesthesia Preprocedure Evaluation (Addendum)
Anesthesia Evaluation  Patient identified by MRN, date of birth, ID band Patient awake  General Assessment Comment:Urinary retention with hernia repair, no other surgeries  Reviewed: Allergy & Precautions, NPO status , Patient's Chart, lab work & pertinent test results, reviewed documented beta blocker date and time   History of Anesthesia Complications (+) history of anesthetic complications  Airway Mallampati: II  TM Distance: >3 FB Neck ROM: Full    Dental no notable dental hx. (+) Teeth Intact, Dental Advisory Given   Pulmonary neg pulmonary ROS,    Pulmonary exam normal breath sounds clear to auscultation       Cardiovascular hypertension, Pt. on medications and Pt. on home beta blockers Normal cardiovascular exam Rhythm:Regular Rate:Normal     Neuro/Psych PSYCHIATRIC DISORDERS Depression negative neurological ROS     GI/Hepatic negative GI ROS, Neg liver ROS,   Endo/Other  Hypothyroidism Obesity BMI 35  Renal/GU negative Renal ROS  negative genitourinary   Musculoskeletal  (+) Arthritis , Osteoarthritis,    Abdominal (+) + obese,   Peds negative pediatric ROS (+)  Hematology negative hematology ROS (+) plt 299   Anesthesia Other Findings   Reproductive/Obstetrics negative OB ROS                             Anesthesia Physical  Anesthesia Plan  ASA: II  Anesthesia Plan: General   Post-op Pain Management:    Induction: Intravenous  PONV Risk Score and Plan: 2 and Ondansetron, Treatment may vary due to age or medical condition and Midazolam  Airway Management Planned: LMA  Additional Equipment: None  Intra-op Plan:   Post-operative Plan: Extubation in OR  Informed Consent: I have reviewed the patients History and Physical, chart, labs and discussed the procedure including the risks, benefits and alternatives for the proposed anesthesia with the patient or authorized  representative who has indicated his/her understanding and acceptance.       Plan Discussed with: CRNA  Anesthesia Plan Comments:        Anesthesia Quick Evaluation

## 2020-08-13 NOTE — H&P (Signed)
Robert Blair is an 64 y.o. male.   Chief Complaint: Halllux Rigidus Right foot HPI:  Patient is a 64 year old gentleman who is seen for initial evaluation and referral from Dr. Durward Fortes.  Patient states he has had previous bunion surgery the right great toe the joint is collapsed patient has decreased range of motion and complains of pain with activities of daily living.   Past Medical History:  Diagnosis Date  . Arthritis   . Cancer ([facility]) 2016   Basal cell  . Complication of anesthesia    URINARY RETENTION  . Depression   . History of adenomatous polyp of colon    2008/  2013/  2016  . History of basal cell carcinoma excision    2015  nose  . History of urinary retention    10-30-2014  POST OP  . Hypothyroidism   . Seasonal allergies   . Unspecified essential hypertension     Past Surgical History:  Procedure Laterality Date  . COLONOSCOPY  last one 02-25-2015  . HYDROCELE EXCISION Right 08/26/2015   Procedure: RIGHT HYDROCELECTOMY ADULT;  Surgeon: Franchot Gallo, MD;  [facility]  Service: Urology;  Laterality: Right;  . INGUINAL HERNIA REPAIR Right 10/29/2014   Procedure: LAPAROSCOPIC REPAIR RECURRENT RIGHT INGUINAL ;  Surgeon: Fanny Skates, MD;  [facility];  Service: General;  Laterality: Right;  . INGUINAL HERNIA REPAIR Right 1984  . INSERTION OF MESH Right 10/29/2014   Procedure: INSERTION OF MESH;  Surgeon: Fanny Skates, MD;  [facility];  Service: General;  Laterality: Right;  . INSERTION OF MESH N/A 06/17/2020   Procedure: INSERTION OF MESH;  Surgeon: Coralie Keens, MD;  Location: [facility];  Service: General;  Laterality: N/A;  . POLYPECTOMY    . TOE OSTEOTOMY Bilateral right 2002/  left 2013   great toe  . TOTAL HIP ARTHROPLASTY Left 09/22/2019   Procedure: LEFT TOTAL HIP ARTHROPLASTY ANTERIOR APPROACH;  Surgeon: Mcarthur Rossetti, MD;  [facility];  Service: Orthopedics;  Laterality: Left;   . UMBILICAL HERNIA REPAIR N/A 06/17/2020   Procedure: UMBILICAL HERNIA REPAIR;  Surgeon: Coralie Keens, MD;  Location: [facility];  Service: General;  Laterality: N/A;    Family History  Problem Relation Age of Onset  . Breast cancer Mother   . Hypertension Mother   . Depression Mother   . Cancer - Other Mother        unknown GI cancer  . Colon polyps Father   . Stroke Father   . Alzheimer's disease Father   . Colon cancer Neg Hx   . Rectal cancer Neg Hx   . Stomach cancer Neg Hx   . Liver disease Neg Hx    Social History:  reports that he has never smoked. He has never used smokeless tobacco. He reports current alcohol use. He reports that he does not use drugs.  Allergies: No Known Allergies  Facility-Administered Medications Prior to Admission  Medication Dose Route Frequency Provider Last Rate Last Admin  . 0.9 %  sodium chloride infusion  500 mL Intravenous Once Pyrtle, Lajuan Lines, MD       Medications Prior to Admission  Medication Sig Dispense Refill  . amLODipine (NORVASC) 10 MG tablet Take 10 mg by mouth daily.     . Cholecalciferol (VITAMIN D3) 50 MCG (2000 UT) TABS Take 4,000 Units by mouth daily.    . clomiPHENE (CLOMID) 50 MG tablet Take 50 mg by mouth  every other day. In the morning    . docusate sodium (COLACE) 100 MG capsule Take 200 mg by mouth 2 (two) times daily.    Marland Kitchen EX-LAX ULTRA 5 MG EC tablet Take 10 mg by mouth 2 (two) times daily.    . fexofenadine (ALLEGRA) 180 MG tablet Take 180 mg by mouth daily as needed for allergies or rhinitis.    . fluticasone (FLONASE) 50 MCG/ACT nasal spray Place 1-2 sprays into both nostrils daily as needed for allergies.   0  . isosorbide mononitrate (IMDUR) 30 MG 24 hr tablet Take 30 mg by mouth daily.    Marland Kitchen levothyroxine (SYNTHROID, LEVOTHROID) 112 MCG tablet Take 112 mcg by mouth daily before breakfast.   2  . MAGNESIUM PO Take 400 mg by mouth daily.     . metoprolol succinate (TOPROL-XL) 50 MG 24 hr tablet  Take 50 mg by mouth daily. Take with or immediately following a meal.    . Multiple Vitamin (MULTIVITAMIN WITH MINERALS) TABS tablet Take 1 tablet by mouth 2 (two) times a week.    . naproxen sodium (ALEVE) 220 MG tablet Take 440-660 mg by mouth 2 (two) times daily as needed (pain.).    Marland Kitchen POTASSIUM PO Take 1 tablet by mouth daily.     . Psyllium (METAMUCIL MULTIHEALTH FIBER) 63 % POWD Take 1 Dose by mouth daily as needed (constipation.).    Marland Kitchen TURMERIC PO Take 400 mg by mouth daily.     . valsartan-hydrochlorothiazide (DIOVAN-HCT) 320-25 MG per tablet Take 1 tablet by mouth daily.     Marland Kitchen VYVANSE 70 MG capsule Take 70 mg by mouth daily as needed (ADD).       Results for orders placed or performed during the hospital encounter of 08/13/20 (from the past 48 hour(s))  Basic metabolic panel per protocol     Status: Abnormal   Collection Time: 08/12/20  9:26 AM  Result Value Ref Range   Sodium 141 135 - 145 mmol/L   Potassium 3.3 (L) 3.5 - 5.1 mmol/L   Chloride 102 98 - 111 mmol/L   CO2 30 22 - 32 mmol/L   Glucose, Bld 112 (H) 70 - 99 mg/dL    Comment: Glucose reference range applies only to samples taken after fasting for at least 8 hours.   BUN 14 8 - 23 mg/dL   Creatinine, Ser 1.06 0.61 - 1.24 mg/dL   Calcium 9.3 8.9 - 10.3 mg/dL   GFR, Estimated >60 >60 mL/min    Comment: (NOTE) Calculated using the CKD-EPI Creatinine Equation (2021)    Anion gap 9 5 - 15    Comment: Performed at [facility] [address] [place], [place] [address]   No results found.  Review of Systems  All other systems reviewed and are negative.   Height 5\' 7"  (1.702 m), weight 97.5 kg. Physical Exam  Patient is alert, oriented, no adenopathy, well-dressed, normal affect, normal respiratory effort. Examination patient is status post bunion surgery to the right great toe by podiatry there is an incision dorsal medially.  Patient has large osteophytic bone spurs around the MTP joint with essentially no  range of motion he has a good dorsalis pedis pulse he has good hair growth in the foot.  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 Hallux  rigidus, right foot   2. Failed bunionectomy, initial encounter     Plan: We will plan for a fusion of the great toe MTP joint as an outpatient surgery.  Patient states he would like to proceed with surgery in November or December he will call us to schedule the surgery.   Bevely Palmer Jaclynn Laumann, PA 08/13/2020, 6:40 AM

## 2020-08-13 NOTE — Discharge Instructions (Signed)

## 2020-08-13 NOTE — Anesthesia Postprocedure Evaluation (Signed)
Anesthesia Post Note  Patient: SKYELAR SWIGART  Procedure(s) Performed: FUSION RIGHT GREAT TOE METATARSALPHALANGEAL JOINT (MTPJ) (Right Toe)     Patient location during evaluation: PACU Anesthesia Type: General Level of consciousness: awake and alert Pain management: pain level controlled Vital Signs Assessment: post-procedure vital signs reviewed and stable Respiratory status: spontaneous breathing, nonlabored ventilation and respiratory function stable Cardiovascular status: blood pressure returned to baseline and stable Postop Assessment: no apparent nausea or vomiting Anesthetic complications: no   No complications documented.  Last Vitals:  Vitals:   08/13/20 1045 08/13/20 1105  BP: 112/67 (P) 131/83  Pulse: 75 (P) 76  Resp: 20 (P) 20  Temp:  (P) 36.5 C  SpO2: 96% (P) 96%    Last Pain:  Vitals:   08/13/20 1105  TempSrc:   PainSc: (P) 3                  Lynda Rainwater

## 2020-08-13 NOTE — Transfer of Care (Signed)
Immediate Anesthesia Transfer of Care Note  Patient: Robert Blair  Procedure(s) Performed: FUSION RIGHT GREAT TOE METATARSALPHALANGEAL JOINT (MTPJ) (Right Toe)  Patient Location: PACU  Anesthesia Type:General  Level of Consciousness: awake and drowsy  Airway & Oxygen Therapy: Patient Spontanous Breathing and Patient connected to face mask oxygen  Post-op Assessment: Report given to RN, Post -op Vital signs reviewed and stable and Patient moving all extremities X 4  Post vital signs: Reviewed and stable  Last Vitals:  Vitals Value Taken Time  BP 113/76 08/13/20 1001  Temp    Pulse 87 08/13/20 1002  Resp 14 08/13/20 1002  SpO2 96 % 08/13/20 1002  Vitals shown include unvalidated device data.  Last Pain:  Vitals:   08/13/20 0715  TempSrc: Oral  PainSc: 0-No pain         Complications: No complications documented.

## 2020-08-14 ENCOUNTER — Encounter (HOSPITAL_BASED_OUTPATIENT_CLINIC_OR_DEPARTMENT_OTHER): Payer: Self-pay | Admitting: Orthopedic Surgery

## 2020-08-20 ENCOUNTER — Encounter: Payer: Self-pay | Admitting: Family

## 2020-08-20 ENCOUNTER — Other Ambulatory Visit: Payer: Self-pay

## 2020-08-20 ENCOUNTER — Ambulatory Visit (INDEPENDENT_AMBULATORY_CARE_PROVIDER_SITE_OTHER): Payer: BC Managed Care – PPO | Admitting: Family

## 2020-08-20 VITALS — Ht 67.0 in | Wt 220.0 lb

## 2020-08-20 DIAGNOSIS — M2021 Hallux rigidus, right foot: Secondary | ICD-10-CM

## 2020-08-20 NOTE — Progress Notes (Signed)
Post-Op Visit Note   Patient: Robert Blair           Date of Birth: 04/27/1956           MRN: [id number] Visit Date: 08/20/2020 PCP: [name], MD  Chief Complaint:  Chief Complaint  Patient presents with  . Right Foot - Routine Post Op    08/13/20 GT MTP joint fusion     HPI:  HPI The patient is a 64 year old gentleman seen status post right great toe MTP fusion one week ago.  Ortho Exam Incision well approximated with sutures. No erythema, drainAGE, odor. No sign of infection.  Visit Diagnoses:  1. Hallux rigidus, right foot     Plan: Begin daily dial soap cleansing and dry dressings. Minimize weight bearing. Use post op shoe. Follow up in 2 weeks for suture removal.  Follow-Up Instructions: Return in about 2 weeks (around 09/03/2020).   Imaging: No results found.  Orders:  No orders of the defined types were placed in this encounter.  No orders of the defined types were placed in this encounter.    PMFS History: Patient Active Problem List   Diagnosis Date Noted  . Hallux rigidus, right foot   . Pain from implanted hardware   . Hallux rigidus, left foot 07/30/2020  . Pain in right shoulder 07/30/2020  . Great toe pain, right 05/09/2020  . 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  . Unilateral primary osteoarthritis, left hip 09/22/2019  . Tendinitis, de Quervain's 06/02/2019  . Pain of left hip joint 02/07/2019  . Recurrent right inguinal hernia 10/29/2014  . Hypertension 01/01/2012  . Hypothyroidism 01/01/2012  . History of adenomatous polyp of colon 01/01/2012   Past Medical History:  Diagnosis Date  . Arthritis   . Cancer ([place]) 2016   Basal cell  . Complication of anesthesia    URINARY RETENTION  . Depression   . History of adenomatous polyp of colon    2008/  2013/  2016  . History of basal cell carcinoma excision    2015  nose  . History of urinary retention    10-30-2014  POST OP  . Hypothyroidism   . Seasonal allergies   .  Unspecified essential hypertension     Family History  Problem Relation Age of Onset  . Breast cancer Mother   . Hypertension Mother   . Depression Mother   . Cancer - Other Mother        unknown GI cancer  . Colon polyps Father   . Stroke Father   . Alzheimer's disease Father   . Colon cancer Neg Hx   . Rectal cancer Neg Hx   . Stomach cancer Neg Hx   . Liver disease Neg Hx     Past Surgical History:  Procedure Laterality Date  . ARTHRODESIS METATARSALPHALANGEAL JOINT (MTPJ) Right 08/13/2020   Procedure: FUSION RIGHT GREAT TOE METATARSALPHALANGEAL JOINT (MTPJ);  Surgeon: Newt Minion, MD;  Location: [facility];  Service: Orthopedics;  Laterality: Right;  . COLONOSCOPY  last one 02-25-2015  . HYDROCELE EXCISION Right 08/26/2015   Procedure: RIGHT HYDROCELECTOMY ADULT;  Surgeon: Franchot Gallo, MD;  Location: [facility];  Service: Urology;  Laterality: Right;  . INGUINAL HERNIA REPAIR Right 10/29/2014   Procedure: LAPAROSCOPIC REPAIR RECURRENT RIGHT INGUINAL ;  Surgeon: Fanny Skates, MD;  [facility];  Service: General;  Laterality: Right;  . INGUINAL HERNIA REPAIR Right 1984  . INSERTION OF MESH Right  10/29/2014   Procedure: INSERTION OF MESH;  Surgeon: Fanny Skates, MD;  [facility];  Service: General;  Laterality: Right;  . INSERTION OF MESH N/A 06/17/2020   Procedure: INSERTION OF MESH;  Surgeon: Coralie Keens, MD;  Location: [facility];  Service: General;  Laterality: N/A;  . POLYPECTOMY    . TOE OSTEOTOMY Bilateral right 2002/  left 2013   great toe  . TOTAL HIP ARTHROPLASTY Left 09/22/2019   Procedure: LEFT TOTAL HIP ARTHROPLASTY ANTERIOR APPROACH;  Surgeon: Mcarthur Rossetti, MD;  [facility];  Service: Orthopedics;  Laterality: Left;  . UMBILICAL HERNIA REPAIR N/A 06/17/2020   Procedure: UMBILICAL HERNIA REPAIR;  Surgeon: Coralie Keens, MD;  Location: [facility];  Service:  General;  Laterality: N/A;   Social History   Occupational History  . Occupation: self employed  Tobacco Use  . Smoking status: Never Smoker  . Smokeless tobacco: Never Used  Vaping Use  . Vaping Use: Never used  Substance and Sexual Activity  . Alcohol use: Yes    Comment: social  . Drug use: No  . Sexual activity: Yes

## 2020-08-29 ENCOUNTER — Ambulatory Visit: Payer: BC Managed Care – PPO | Admitting: Orthopedic Surgery

## 2020-09-03 ENCOUNTER — Ambulatory Visit (INDEPENDENT_AMBULATORY_CARE_PROVIDER_SITE_OTHER): Payer: BC Managed Care – PPO | Admitting: Physician Assistant

## 2020-09-03 ENCOUNTER — Encounter: Payer: Self-pay | Admitting: Physician Assistant

## 2020-09-03 ENCOUNTER — Ambulatory Visit (INDEPENDENT_AMBULATORY_CARE_PROVIDER_SITE_OTHER): Payer: BC Managed Care – PPO

## 2020-09-03 DIAGNOSIS — M2021 Hallux rigidus, right foot: Secondary | ICD-10-CM

## 2020-09-03 NOTE — Progress Notes (Signed)
Office Visit Note   Patient: Robert Blair           Date of Birth: 1955-09-28           MRN: [id number] Visit Date: 09/03/2020              Requested by: Newt Minion, MD [place],  [place] [address] PCP: [name], MD  No chief complaint on file.     HPI: The patient is 3 weeks status post right first MTP arthrodesis.  Overall he is doing quite well and has minimal to no complaints of pain he has been wearing a postop shoe and weightbearing to his heel  Assessment & Plan: Visit Diagnoses:  1. Hallux rigidus, right foot     Plan: Patient may wear regular sock he should use cocoa butter or vitamin E oil on the scar.  Should continue heel weightbearing until he is 4 weeks from surgery and then may weight-bear in the postop shoe.  He if he switches to a regular shoe he understands it must be stiff follow-up in 4 weeks  Follow-Up Instructions: No follow-ups on file.   Ortho Exam  Patient is alert, oriented, no adenopathy, well-dressed, normal affect, normal respiratory effort. Right foot well-healed surgical incision.  Sutures were harvested without difficulty.  Mild soft tissue swelling no erythema or cellulitis.  No signs of infection  Imaging: XR Foot Complete Right  Result Date: 09/03/2020 Three-view radiographs of his right foot were reviewed today they demonstrate well-maintained alignment to the MTP joint fusion plate is in place with screws intact  No images are attached to the encounter.  Labs: No results found for: HGBA1C, ESRSEDRATE, CRP, LABURIC, REPTSTATUS, GRAMSTAIN, CULT, LABORGA   Lab Results  Component Value Date   ALBUMIN 4.5 10/24/2014    No results found for: MG No results found for: VD25OH  No results found for: PREALBUMIN CBC EXTENDED Latest Ref Rng & Units 09/18/2019 08/26/2015 10/24/2014  WBC 4.0 - 10.5 K/uL 10.2 - 9.1  RBC 4.22 - 5.81 MIL/uL 5.74 - 5.24  HGB 13.0 - 17.0 g/dL 17.3(H) 17.0 15.8  HCT 39.0 - 52.0 % 50.9  50.0 45.5  PLT 150 - 400 K/uL 250 - 243  NEUTROABS 1.7 - 7.7 K/uL - - 6.0  LYMPHSABS 0.7 - 4.0 K/uL - - 2.1     There is no height or weight on file to calculate BMI.  Orders:  Orders Placed This Encounter  Procedures  . XR Foot Complete Right   No orders of the defined types were placed in this encounter.    Procedures: No procedures performed  Clinical Data: No additional findings.  ROS:  All other systems negative, except as noted in the HPI. Review of Systems  Objective: Vital Signs: There were no vitals taken for this visit.  Specialty Comments:  No specialty comments available.  PMFS History: Patient Active Problem List   Diagnosis Date Noted  . Hallux rigidus, right foot   . Pain from implanted hardware   . Hallux rigidus, left foot 07/30/2020  . Pain in right shoulder 07/30/2020  . Great toe pain, right 05/09/2020  . 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  . Unilateral primary osteoarthritis, left hip 09/22/2019  . Tendinitis, de Quervain's 06/02/2019  . Pain of left hip joint 02/07/2019  . Recurrent right inguinal hernia 10/29/2014  . Hypertension 01/01/2012  . Hypothyroidism 01/01/2012  . History of adenomatous polyp of colon 01/01/2012   Past Medical History:  Diagnosis Date  . Arthritis   . Cancer ([place]) 2016   Basal cell  . Complication of anesthesia    URINARY RETENTION  . Depression   . History of adenomatous polyp of colon    2008/  2013/  2016  . History of basal cell carcinoma excision    2015  nose  . History of urinary retention    10-30-2014  POST OP  . Hypothyroidism   . Seasonal allergies   . Unspecified essential hypertension     Family History  Problem Relation Age of Onset  . Breast cancer Mother   . Hypertension Mother   . Depression Mother   . Cancer - Other Mother        unknown GI cancer  . Colon polyps Father   . Stroke Father   . Alzheimer's disease Father   . Colon cancer Neg Hx   . Rectal cancer Neg Hx    . Stomach cancer Neg Hx   . Liver disease Neg Hx     Past Surgical History:  Procedure Laterality Date  . ARTHRODESIS METATARSALPHALANGEAL JOINT (MTPJ) Right 08/13/2020   Procedure: FUSION RIGHT GREAT TOE METATARSALPHALANGEAL JOINT (MTPJ);  Surgeon: Newt Minion, MD;  Location: [facility];  Service: Orthopedics;  Laterality: Right;  . COLONOSCOPY  last one 02-25-2015  . HYDROCELE EXCISION Right 08/26/2015   Procedure: RIGHT HYDROCELECTOMY ADULT;  Surgeon: Franchot Gallo, MD;  Location[facility];  Service: Urology;  Laterality: Right;  . INGUINAL HERNIA REPAIR Right 10/29/2014   Procedure: LAPAROSCOPIC REPAIR RECURRENT RIGHT INGUINAL ;  Surgeon: Fanny Skates, MD;  [facility];  Service: General;  Laterality: Right;  . INGUINAL HERNIA REPAIR Right 1984  . INSERTION OF MESH Right 10/29/2014   Procedure: INSERTION OF MESH;  Surgeon: Fanny Skates, MD;  [facility];  Service: General;  Laterality: Right;  . INSERTION OF MESH N/A 06/17/2020   Procedure: INSERTION OF MESH;  Surgeon: Coralie Keens, MD;  Location: [facility];  Service: General;  Laterality: N/A;  . POLYPECTOMY    . TOE OSTEOTOMY Bilateral right 2002/  left 2013   great toe  . TOTAL HIP ARTHROPLASTY Left 09/22/2019   Procedure: LEFT TOTAL HIP ARTHROPLASTY ANTERIOR APPROACH;  Surgeon: Mcarthur Rossetti, MD;  [facility];  Service: Orthopedics;  Laterality: Left;  . UMBILICAL HERNIA REPAIR N/A 06/17/2020   Procedure: UMBILICAL HERNIA REPAIR;  Surgeon: Coralie Keens, MD;  Location: [facility];  Service: General;  Laterality: N/A;   Social History   Occupational History  . Occupation: self employed  Tobacco Use  . Smoking status: Never Smoker  . Smokeless tobacco: Never Used  Vaping Use  . Vaping Use: Never used  Substance and Sexual Activity  . Alcohol use: Yes    Comment: social  . Drug use: No  . Sexual activity: Yes

## 2020-09-12 ENCOUNTER — Ambulatory Visit: Payer: BC Managed Care – PPO | Admitting: Orthopaedic Surgery

## 2020-10-01 ENCOUNTER — Ambulatory Visit: Payer: BC Managed Care – PPO | Admitting: Physician Assistant

## 2020-10-02 ENCOUNTER — Ambulatory Visit (INDEPENDENT_AMBULATORY_CARE_PROVIDER_SITE_OTHER): Payer: BC Managed Care – PPO | Admitting: Physician Assistant

## 2020-10-02 ENCOUNTER — Encounter: Payer: Self-pay | Admitting: Physician Assistant

## 2020-10-02 DIAGNOSIS — M2021 Hallux rigidus, right foot: Secondary | ICD-10-CM

## 2020-10-02 NOTE — Progress Notes (Signed)
Office Visit Note   Patient: Robert Blair           Date of Birth: 1956-03-03           MRN: [id number] Visit Date: 10/02/2020              Requested by: [name], [place], [place],  [place] [address] PCP: [name], MD  No chief complaint on file.     HPI: Patient is 7 weeks status post right first MTP arthrodesis.  He reports he is doing quite well and feels better than prior to surgery.  He still is working at normalizing his gait  Assessment & Plan: Visit Diagnoses: No diagnosis found.  Plan: Patient may progress activities as tolerated he understands to let pain and swelling be his guide he would like to follow-up if he has any issues but does not want to make a specific appointment.  Continue to work on Hotel manager.  And gait normalization  Follow-Up Instructions: No follow-ups on file.   Ortho Exam  Patient is alert, oriented, no adenopathy, well-dressed, normal affect, normal respiratory effort. Right first MTP joint well-healed surgical incision no swelling no erythema no cellulitis.  Range of motion of the first MTP point joint is rigid.  He has significant less swelling in the lesser digits and is able to move them more easily no signs of infection  Imaging: No results found. No images are attached to the encounter.  Labs: No results found for: HGBA1C, ESRSEDRATE, CRP, LABURIC, REPTSTATUS, GRAMSTAIN, CULT, LABORGA   Lab Results  Component Value Date   ALBUMIN 4.5 10/24/2014    No results found for: MG No results found for: VD25OH  No results found for: PREALBUMIN CBC EXTENDED Latest Ref Rng & Units 09/18/2019 08/26/2015 10/24/2014  WBC 4.0 - 10.5 K/uL 10.2 - 9.1  RBC 4.22 - 5.81 MIL/uL 5.74 - 5.24  HGB 13.0 - 17.0 g/dL 17.3(H) 17.0 15.8  HCT 39.0 - 52.0 % 50.9 50.0 45.5  PLT 150 - 400 K/uL 250 - 243  NEUTROABS 1.7 - 7.7 K/uL - - 6.0  LYMPHSABS 0.7 - 4.0 K/uL - - 2.1     There is no height or weight on file to calculate  BMI.  Orders:  No orders of the defined types were placed in this encounter.  No orders of the defined types were placed in this encounter.    Procedures: No procedures performed  Clinical Data: No additional findings.  ROS:  All other systems negative, except as noted in the HPI. Review of Systems  Objective: Vital Signs: There were no vitals taken for this visit.  Specialty Comments:  No specialty comments available.  PMFS History: Patient Active Problem List   Diagnosis Date Noted  . Hallux rigidus, right foot   . Pain from implanted hardware   . Hallux rigidus, left foot 07/30/2020  . Pain in right shoulder 07/30/2020  . Great toe pain, right 05/09/2020  . 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  . Unilateral primary osteoarthritis, left hip 09/22/2019  . Tendinitis, de Quervain's 06/02/2019  . Pain of left hip joint 02/07/2019  . Recurrent right inguinal hernia 10/29/2014  . Hypertension 01/01/2012  . Hypothyroidism 01/01/2012  . History of adenomatous polyp of colon 01/01/2012   Past Medical History:  Diagnosis Date  . Arthritis   . Cancer ([place]) 2016   Basal cell  . Complication of anesthesia    URINARY RETENTION  . Depression   .  History of adenomatous polyp of colon    2008/  2013/  2016  . History of basal cell carcinoma excision    2015  nose  . History of urinary retention    10-30-2014  POST OP  . Hypothyroidism   . Seasonal allergies   . Unspecified essential hypertension     Family History  Problem Relation Age of Onset  . Breast cancer Mother   . Hypertension Mother   . Depression Mother   . Cancer - Other Mother        unknown GI cancer  . Colon polyps Father   . Stroke Father   . Alzheimer's disease Father   . Colon cancer Neg Hx   . Rectal cancer Neg Hx   . Stomach cancer Neg Hx   . Liver disease Neg Hx     Past Surgical History:  Procedure Laterality Date  . ARTHRODESIS METATARSALPHALANGEAL JOINT (MTPJ) Right 08/13/2020    Procedure: FUSION RIGHT GREAT TOE METATARSALPHALANGEAL JOINT (MTPJ);  Surgeon: Newt Minion, MD;  Location: [facility];  Service: Orthopedics;  Laterality: Right;  . COLONOSCOPY  last one 02-25-2015  . HYDROCELE EXCISION Right 08/26/2015   Procedure: RIGHT HYDROCELECTOMY ADULT;  Surgeon: Franchot Gallo, MD;  Location[facility];  Service: Urology;  Laterality: Right;  . INGUINAL HERNIA REPAIR Right 10/29/2014   Procedure: LAPAROSCOPIC REPAIR RECURRENT RIGHT INGUINAL ;  Surgeon: Fanny Skates, MD;  [facility];  Service: General;  Laterality: Right;  . INGUINAL HERNIA REPAIR Right 1984  . INSERTION OF MESH Right 10/29/2014   Procedure: INSERTION OF MESH;  Surgeon: Fanny Skates, MD;  [facility];  Service: General;  Laterality: Right;  . INSERTION OF MESH N/A 06/17/2020   Procedure: INSERTION OF MESH;  Surgeon: Coralie Keens, MD;  Location: [facility];  Service: General;  Laterality: N/A;  . POLYPECTOMY    . TOE OSTEOTOMY Bilateral right 2002/  left 2013   great toe  . TOTAL HIP ARTHROPLASTY Left 09/22/2019   Procedure: LEFT TOTAL HIP ARTHROPLASTY ANTERIOR APPROACH;  Surgeon: Mcarthur Rossetti, MD;  [facility];  Service: Orthopedics;  Laterality: Left;  . UMBILICAL HERNIA REPAIR N/A 06/17/2020   Procedure: UMBILICAL HERNIA REPAIR;  Surgeon: Coralie Keens, MD;  Location: [facility];  Service: General;  Laterality: N/A;   Social History   Occupational History  . Occupation: self employed  Tobacco Use  . Smoking status: Never Smoker  . Smokeless tobacco: Never Used  Vaping Use  . Vaping Use: Never used  Substance and Sexual Activity  . Alcohol use: Yes    Comment: social  . Drug use: No  . Sexual activity: Yes

## 2020-10-16 ENCOUNTER — Encounter: Payer: Self-pay | Admitting: Orthopaedic Surgery

## 2020-10-16 ENCOUNTER — Ambulatory Visit: Payer: BC Managed Care – PPO | Admitting: Orthopaedic Surgery

## 2020-10-16 VITALS — Ht 67.0 in | Wt 220.0 lb

## 2020-10-16 DIAGNOSIS — M25511 Pain in right shoulder: Secondary | ICD-10-CM | POA: Diagnosis not present

## 2020-10-16 DIAGNOSIS — G8929 Other chronic pain: Secondary | ICD-10-CM

## 2020-10-16 NOTE — Progress Notes (Signed)
Office Visit Note   Patient: Robert Blair           Date of Birth: [birth year]           MRN: [id number] Visit Date: 10/16/2020              Requested by: [name], [place] [address] [place], [place],  [place] [address] PCP: [name], MD   Assessment & Plan: Visit Diagnoses:  1. Chronic right shoulder pain     Plan: Mr. Duerson has continues to have right shoulder pain.  He was last seen in November with positive impingement.  He has had a prior biceps tendon rupture on the left and I suspect he may have at least a similar problem on the right with long head of the biceps pathology.  It has not ruptured.  He does have some compromise of his activities.  I think it is worth obtaining an MRI scan at this point.  I suspect he has biceps pathology and possibly a small rotator cuff tear. Follow-Up Instructions: Return After MRI scan right shoulder.   Orders:  Orders Placed This Encounter  Procedures  . MR SHOULDER RIGHT WO CONTRAST   No orders of the defined types were placed in this encounter.     Procedures: No procedures performed   Clinical Data: No additional findings.   Subjective: Chief Complaint  Patient presents with  . Right Shoulder - Follow-up  Patient presents today for his right shoulder. He was last evaluated in November of 2021. He states that he continues to have the same issues as before. He has been experiencing anterior shoulder pain that radiates all the way down his arm. He has taken some oxycodone he had from a recent foot surgery and that helped. He wants to talk about treatment options for his biceps.  Prior films of his right shoulder were nondiagnostic  HPI  Review of Systems   Objective: Vital Signs: Ht 5\' 7"  (1.702 m)   Wt 220 lb (99.8 kg)   BMI 34.46 kg/m   Physical Exam Constitutional:      Appearance: He is well-developed and well-nourished.  HENT:     Mouth/Throat:     Mouth: Oropharynx is clear and moist.  Eyes:      Extraocular Movements: EOM normal.     Pupils: Pupils are equal, round, and reactive to light.  Pulmonary:     Effort: Pulmonary effort is normal.  Skin:    General: Skin is warm and dry.  Neurological:     Mental Status: He is alert and oriented to person, place, and time.  Psychiatric:        Mood and Affect: Mood and affect normal.        Behavior: Behavior normal.     Ortho Exam awake alert and oriented x3.  Comfortable sitting.  Able to place right arm fully overhead with just a little discomfort and a circuitous arc of motion.  Appears to have good strength.  Minimally positive Speed sign.  Some discomfort on the extreme of internal and external rotation in the impingement position.  Minimally positive empty can testing.  No popping or clicking.  Some discomfort along the biceps tendon long head proximally good biceps in its normal position  Specialty Comments:  No specialty comments available.  Imaging: No results found.   PMFS History: Patient Active Problem List   Diagnosis Date Noted  . Hallux rigidus, right foot   . Pain from implanted  hardware   . Hallux rigidus, left foot 07/30/2020  . Pain in right shoulder 07/30/2020  . Great toe pain, right 05/09/2020  . 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  . Unilateral primary osteoarthritis, left hip 09/22/2019  . Tendinitis, de Quervain's 06/02/2019  . Pain of left hip joint 02/07/2019  . Recurrent right inguinal hernia 10/29/2014  . Hypertension 01/01/2012  . Hypothyroidism 01/01/2012  . History of adenomatous polyp of colon 01/01/2012   Past Medical History:  Diagnosis Date  . Arthritis   . Cancer ([place]) 2016   Basal cell  . Complication of anesthesia    URINARY RETENTION  . Depression   . History of adenomatous polyp of colon    2008/  2013/  2016  . History of basal cell carcinoma excision    2015  nose  . History of urinary retention    10-30-2014  POST OP  . Hypothyroidism   . Seasonal allergies   .  Unspecified essential hypertension     Family History  Problem Relation Age of Onset  . Breast cancer Mother   . Hypertension Mother   . Depression Mother   . Cancer - Other Mother        unknown GI cancer  . Colon polyps Father   . Stroke Father   . Alzheimer's disease Father   . Colon cancer Neg Hx   . Rectal cancer Neg Hx   . Stomach cancer Neg Hx   . Liver disease Neg Hx     Past Surgical History:  Procedure Laterality Date  . ARTHRODESIS METATARSALPHALANGEAL JOINT (MTPJ) Right 08/13/2020   Procedure: FUSION RIGHT GREAT TOE METATARSALPHALANGEAL JOINT (MTPJ);  Surgeon: Newt Minion, MD;  Location: [facility];  Service: Orthopedics;  Laterality: Right;  . COLONOSCOPY  last one 02-25-2015  . HYDROCELE EXCISION Right 08/26/2015   Procedure: RIGHT HYDROCELECTOMY ADULT;  Surgeon: Franchot Gallo, MD;  Location: [facility];  Service: Urology;  Laterality: Right;  . INGUINAL HERNIA REPAIR Right 10/29/2014   Procedure: LAPAROSCOPIC REPAIR RECURRENT RIGHT INGUINAL ;  Surgeon: Fanny Skates, MD;  [facility];  Service: General;  Laterality: Right;  . INGUINAL HERNIA REPAIR Right 1984  . INSERTION OF MESH Right 10/29/2014   Procedure: INSERTION OF MESH;  Surgeon: Fanny Skates, MD;  [facility];  Service: General;  Laterality: Right;  . INSERTION OF MESH N/A 06/17/2020   Procedure: INSERTION OF MESH;  Surgeon: Coralie Keens, MD;  Location: [facility];  Service: General;  Laterality: N/A;  . POLYPECTOMY    . TOE OSTEOTOMY Bilateral right 2002/  left 2013   great toe  . TOTAL HIP ARTHROPLASTY Left 09/22/2019   Procedure: LEFT TOTAL HIP ARTHROPLASTY ANTERIOR APPROACH;  Surgeon: Mcarthur Rossetti, MD;  [facility];  Service: Orthopedics;  Laterality: Left;  . UMBILICAL HERNIA REPAIR N/A 06/17/2020   Procedure: UMBILICAL HERNIA REPAIR;  Surgeon: Coralie Keens, MD;  Location: [facility];  Service:  General;  Laterality: N/A;   Social History   Occupational History  . Occupation: self employed  Tobacco Use  . Smoking status: Never Smoker  . Smokeless tobacco: Never Used  Vaping Use  . Vaping Use: Never used  Substance and Sexual Activity  . Alcohol use: Yes    Comment: social  . Drug use: No  . Sexual activity: Yes

## 2020-11-03 ENCOUNTER — Other Ambulatory Visit: Payer: Self-pay

## 2020-11-03 ENCOUNTER — Ambulatory Visit
Admission: RE | Admit: 2020-11-03 | Discharge: 2020-11-03 | Disposition: A | Discharge status: Home / Self Care | Payer: BC Managed Care – PPO | Source: Ambulatory Visit | Attending: Orthopaedic Surgery | Admitting: Orthopaedic Surgery

## 2020-11-03 DIAGNOSIS — G8929 Other chronic pain: Secondary | ICD-10-CM

## 2020-11-03 DIAGNOSIS — M25511 Pain in right shoulder: Secondary | ICD-10-CM

## 2020-11-04 ENCOUNTER — Telehealth: Payer: Self-pay

## 2020-11-04 NOTE — Telephone Encounter (Signed)
Patient would like to be worked in to discuss MRI results this week.  Cb# [phone].  Please advise.  Thank you.

## 2020-11-04 NOTE — Telephone Encounter (Signed)
Spoke with patient and scheduled.

## 2020-11-07 ENCOUNTER — Other Ambulatory Visit: Payer: Self-pay

## 2020-11-07 ENCOUNTER — Ambulatory Visit: Payer: BC Managed Care – PPO | Admitting: Orthopaedic Surgery

## 2020-11-07 ENCOUNTER — Encounter: Payer: Self-pay | Admitting: Orthopaedic Surgery

## 2020-11-07 VITALS — Ht 67.0 in | Wt 220.0 lb

## 2020-11-07 DIAGNOSIS — M25511 Pain in right shoulder: Secondary | ICD-10-CM | POA: Diagnosis not present

## 2020-11-07 DIAGNOSIS — G8929 Other chronic pain: Secondary | ICD-10-CM | POA: Diagnosis not present

## 2020-11-07 MED ORDER — ALPRAZOLAM 0.25 MG PO TABS: 0.2500 mg | ORAL_TABLET | Freq: Every evening | ORAL | 0 refills | Status: DC | PRN

## 2020-11-07 NOTE — Progress Notes (Signed)
Office Visit Note   Patient: Robert Blair           Date of Birth: [birth year]           MRN: [id number] Visit Date: 11/07/2020              Requested by: [name], [address], [place],  [place] [address] PCP: [name], MD   Assessment & Plan: Visit Diagnoses:  1. Chronic right shoulder pain     Plan: Robert Blair had an MRI scan of his right shoulder. He had a partial articular surface tear of the infraspinatus that was about 5 mm without retraction. There was no atrophy or retraction. He did have a split tear of the biceps tendon with tendinopathy of the intra and extra-articular long head of the biceps. He also had an os acromiale with degenerative change about the synchondrosis. That does not appear to be symptomatic. He had moderate AC joint arthritis and a small subacromial spur. He does have a positive Speed sign. We had a long discussion regarding his present problem with the right shoulder he would like to proceed with arthroscopy. This would include an arthroscopic SCD DCR and release of the biceps tendon. He would prefer not to have it tenodesed. He has a rupture of the biceps long head on the left side without a problem. I discussed the surgery, outpatient nature, incisions and what he may expect postoperatively. We will proceed. Has had Xanax in the past at night which helps him sleep with his shoulder. Will prescribe  Follow-Up Instructions: Return We will schedule right shoulder arthroscopy with an SCD DCR and biceps tendon release.   Orders:  No orders of the defined types were placed in this encounter.  Meds ordered this encounter  Medications  . ALPRAZolam (XANAX) 0.25 MG tablet    Sig: Take 1 tablet (0.25 mg total) by mouth at bedtime as needed for anxiety.    Dispense:  30 tablet    Refill:  0      Procedures: No procedures performed   Clinical Data: No additional findings.   Subjective: Chief Complaint  Patient presents with  .  Right Shoulder - Follow-up    MRI review  Patient presents today for follow up on his right shoulder. He had an MRI performed and is here today for those results. No change in symptoms. Having difficulty with overhead activities  HPI  Review of Systems   Objective: Vital Signs: Ht 5\' 7"  (1.702 m)   Wt 220 lb (99.8 kg)   BMI 34.46 kg/m   Physical Exam Constitutional:      Appearance: He is well-developed and well-nourished.  HENT:     Mouth/Throat:     Mouth: Oropharynx is clear and moist.  Eyes:     Extraocular Movements: EOM normal.     Pupils: Pupils are equal, round, and reactive to light.  Pulmonary:     Effort: Pulmonary effort is normal.  Skin:    General: Skin is warm and dry.  Neurological:     Mental Status: He is alert and oriented to person, place, and time.  Psychiatric:        Mood and Affect: Mood and affect normal.        Behavior: Behavior normal.     Ortho Exam awake alert and oriented x3. Comfortable sitting. Able to place his right arm overhead but with a circuitous arc of motion. Positive Speed sign. Some pain on the  extreme of external rotation in the impingement position. No pain over the [facility] joint. Minimal discomfort over the anterior subacromial region. Biceps muscle appears to be located in its normal position and not inferior. Good grip and good release. Abducts at least 90 degrees but with some impingement at that point Specialty Comments:  No specialty comments available.  Imaging: No results found.   PMFS History: Patient Active Problem List   Diagnosis Date Noted  . Hallux rigidus, right foot   . Pain from implanted hardware   . Hallux rigidus, left foot 07/30/2020  . Pain in right shoulder 07/30/2020  . Great toe pain, right 05/09/2020  . 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  . Unilateral primary osteoarthritis, left hip 09/22/2019  . Tendinitis, de Quervain's 06/02/2019  . Pain of left hip joint 02/07/2019  . Recurrent right  inguinal hernia 10/29/2014  . Hypertension 01/01/2012  . Hypothyroidism 01/01/2012  . History of adenomatous polyp of colon 01/01/2012   Past Medical History:  Diagnosis Date  . Arthritis   . Cancer ([place]) 2016   Basal cell  . Complication of anesthesia    URINARY RETENTION  . Depression   . History of adenomatous polyp of colon    2008/  2013/  2016  . History of basal cell carcinoma excision    2015  nose  . History of urinary retention    10-30-2014  POST OP  . Hypothyroidism   . Seasonal allergies   . Unspecified essential hypertension     Family History  Problem Relation Age of Onset  . Breast cancer Mother   . Hypertension Mother   . Depression Mother   . Cancer - Other Mother        unknown GI cancer  . Colon polyps Father   . Stroke Father   . Alzheimer's disease Father   . Colon cancer Neg Hx   . Rectal cancer Neg Hx   . Stomach cancer Neg Hx   . Liver disease Neg Hx     Past Surgical History:  Procedure Laterality Date  . ARTHRODESIS METATARSALPHALANGEAL JOINT (MTPJ) Right 08/13/2020   Procedure: FUSION RIGHT GREAT TOE METATARSALPHALANGEAL JOINT (MTPJ);  Surgeon: Newt Minion, MD;  Location: [facility];  Service: Orthopedics;  Laterality: Right;  . COLONOSCOPY  last one 02-25-2015  . HYDROCELE EXCISION Right 08/26/2015   Procedure: RIGHT HYDROCELECTOMY ADULT;  Surgeon: Franchot Gallo, MD;  [facility];  Service: Urology;  Laterality: Right;  . INGUINAL HERNIA REPAIR Right 10/29/2014   Procedure: LAPAROSCOPIC REPAIR RECURRENT RIGHT INGUINAL ;  Surgeon: Fanny Skates, MD;  [facility];  Service: General;  Laterality: Right;  . INGUINAL HERNIA REPAIR Right 1984  . INSERTION OF MESH Right 10/29/2014   Procedure: INSERTION OF MESH;  Surgeon: Fanny Skates, MD;  [facility];  Service: General;  Laterality: Right;  . INSERTION OF MESH N/A 06/17/2020   Procedure: INSERTION OF MESH;  Surgeon: Coralie Keens,  MD;  Location: [facility];  Service: General;  Laterality: N/A;  . POLYPECTOMY    . TOE OSTEOTOMY Bilateral right 2002/  left 2013   great toe  . TOTAL HIP ARTHROPLASTY Left 09/22/2019   Procedure: LEFT TOTAL HIP ARTHROPLASTY ANTERIOR APPROACH;  Surgeon: Mcarthur Rossetti, MD;  [facility];  Service: Orthopedics;  Laterality: Left;  . UMBILICAL HERNIA REPAIR N/A 06/17/2020   Procedure: UMBILICAL HERNIA REPAIR;  Surgeon: Coralie Keens, MD;  Location: [facility];  Service: General;  Laterality: N/A;   Social History   Occupational History  . Occupation: self employed  Tobacco Use  . Smoking status: Never Smoker  . Smokeless tobacco: Never Used  Vaping Use  . Vaping Use: Never used  Substance and Sexual Activity  . Alcohol use: Yes    Comment: social  . Drug use: No  . Sexual activity: Yes

## 2020-11-14 ENCOUNTER — Encounter: Payer: Self-pay | Admitting: Orthopaedic Surgery

## 2020-11-14 ENCOUNTER — Other Ambulatory Visit: Payer: Self-pay | Admitting: Orthopedic Surgery

## 2020-11-14 DIAGNOSIS — M7541 Impingement syndrome of right shoulder: Secondary | ICD-10-CM

## 2020-11-14 DIAGNOSIS — S46211A Strain of muscle, fascia and tendon of other parts of biceps, right arm, initial encounter: Secondary | ICD-10-CM | POA: Diagnosis not present

## 2020-11-14 DIAGNOSIS — M75111 Incomplete rotator cuff tear or rupture of right shoulder, not specified as traumatic: Secondary | ICD-10-CM | POA: Diagnosis not present

## 2020-11-14 DIAGNOSIS — M19011 Primary osteoarthritis, right shoulder: Secondary | ICD-10-CM

## 2020-11-14 MED ORDER — OXYCODONE-ACETAMINOPHEN 5-325 MG PO TABS
1.0000 | ORAL_TABLET | ORAL | 0 refills | Status: DC | PRN
Start: 2020-11-14 — End: 2021-08-04

## 2020-11-15 ENCOUNTER — Telehealth: Payer: Self-pay

## 2020-11-15 NOTE — Telephone Encounter (Signed)
Patient called. He states that his bandage is coming loose on his shoulder. He will use tape to help it stick. He has no pain and is doing well otherwise. I encouraged him to call back if he has any further questions.

## 2020-11-21 ENCOUNTER — Other Ambulatory Visit: Payer: Self-pay

## 2020-11-21 ENCOUNTER — Ambulatory Visit (INDEPENDENT_AMBULATORY_CARE_PROVIDER_SITE_OTHER): Payer: BC Managed Care – PPO | Admitting: Orthopaedic Surgery

## 2020-11-21 ENCOUNTER — Encounter: Payer: Self-pay | Admitting: Orthopaedic Surgery

## 2020-11-21 DIAGNOSIS — M25511 Pain in right shoulder: Secondary | ICD-10-CM

## 2020-11-21 DIAGNOSIS — G8929 Other chronic pain: Secondary | ICD-10-CM

## 2020-11-21 NOTE — Progress Notes (Signed)
Office Visit Note   Patient: Robert Blair           Date of Birth: 29-Dec-1955           MRN: [id number] Visit Date: 11/21/2020              Requested by: [name], [address], [place],  [place] [address] PCP: [name], MD   Assessment & Plan: Visit Diagnoses:  1. Chronic right shoulder pain     Plan: Mr. Ringer is 1 week status post right shoulder surgery involving an arthroscopic SCD, DCR and a biceps tendon release.  The biceps tendon was significantly torn.  Doing very well and does not have any the preoperative pain.  Will work on range of motion exercises.  May discontinue the sling.  Office 2 weeks.  Follow-Up Instructions: Return in about 2 weeks (around 12/05/2020).   Orders:  No orders of the defined types were placed in this encounter.  No orders of the defined types were placed in this encounter.     Procedures: No procedures performed   Clinical Data: No additional findings.   Subjective: Chief Complaint  Patient presents with  . Right Shoulder - Follow-up    Right shoulder arthroscopy 11/14/2020  Patient presents today for follow up on his right shoulder. He had a right shoulder arthroscopy on 11/14/2020. He is now one week out from surgery. Patient states that he is doing well. He states that he does not wear the sling as much as he should. He feels like he may have initially over worked his shoulder by not wearing the sling.   HPI  Review of Systems   Objective: Vital Signs: There were no vitals taken for this visit.  Physical Exam  Ortho Exam right shoulder incisions healing without problem.  Stitches removed from the anterior lateral arthroscopic portals.  I could passively raise his arm fully over his head.  Good grip and good release.  Specialty Comments:  No specialty comments available.  Imaging: No results found.   PMFS History: Patient Active Problem List   Diagnosis Date Noted  . Hallux rigidus, right foot    . Pain from implanted hardware   . Hallux rigidus, left foot 07/30/2020  . Pain in right shoulder 07/30/2020  . Great toe pain, right 05/09/2020  . 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  . Unilateral primary osteoarthritis, left hip 09/22/2019  . Tendinitis, de Quervain's 06/02/2019  . Pain of left hip joint 02/07/2019  . Recurrent right inguinal hernia 10/29/2014  . Hypertension 01/01/2012  . Hypothyroidism 01/01/2012  . History of adenomatous polyp of colon 01/01/2012   Past Medical History:  Diagnosis Date  . Arthritis   . Cancer ([place]) 2016   Basal cell  . Complication of anesthesia    URINARY RETENTION  . Depression   . History of adenomatous polyp of colon    2008/  2013/  2016  . History of basal cell carcinoma excision    2015  nose  . History of urinary retention    10-30-2014  POST OP  . Hypothyroidism   . Seasonal allergies   . Unspecified essential hypertension     Family History  Problem Relation Age of Onset  . Breast cancer Mother   . Hypertension Mother   . Depression Mother   . Cancer - Other Mother        unknown GI cancer  . Colon polyps Father   . Stroke Father   .  Alzheimer's disease Father   . Colon cancer Neg Hx   . Rectal cancer Neg Hx   . Stomach cancer Neg Hx   . Liver disease Neg Hx     Past Surgical History:  Procedure Laterality Date  . ARTHRODESIS METATARSALPHALANGEAL JOINT (MTPJ) Right 08/13/2020   Procedure: FUSION RIGHT GREAT TOE METATARSALPHALANGEAL JOINT (MTPJ);  Surgeon: Newt Minion, MD;  Location: [facility];  Service: Orthopedics;  Laterality: Right;  . COLONOSCOPY  last one 02-25-2015  . HYDROCELE EXCISION Right 08/26/2015   Procedure: RIGHT HYDROCELECTOMY ADULT;  Surgeon: Franchot Gallo, MD;  [facility];  Service: Urology;  Laterality: Right;  . INGUINAL HERNIA REPAIR Right 10/29/2014   Procedure: LAPAROSCOPIC REPAIR RECURRENT RIGHT INGUINAL ;  Surgeon: Fanny Skates, MD;   [facility];  Service: General;  Laterality: Right;  . INGUINAL HERNIA REPAIR Right 1984  . INSERTION OF MESH Right 10/29/2014   Procedure: INSERTION OF MESH;  Surgeon: Fanny Skates, MD;  [facility];  Service: General;  Laterality: Right;  . INSERTION OF MESH N/A 06/17/2020   Procedure: INSERTION OF MESH;  Surgeon: Coralie Keens, MD;  Location: [facility];  Service: General;  Laterality: N/A;  . POLYPECTOMY    . TOE OSTEOTOMY Bilateral right 2002/  left 2013   great toe  . TOTAL HIP ARTHROPLASTY Left 09/22/2019   Procedure: LEFT TOTAL HIP ARTHROPLASTY ANTERIOR APPROACH;  Surgeon: Mcarthur Rossetti, MD;  [facility];  Service: Orthopedics;  Laterality: Left;  . UMBILICAL HERNIA REPAIR N/A 06/17/2020   Procedure: UMBILICAL HERNIA REPAIR;  Surgeon: Coralie Keens, MD;  Location: [facility];  Service: General;  Laterality: N/A;   Social History   Occupational History  . Occupation: self employed  Tobacco Use  . Smoking status: Never Smoker  . Smokeless tobacco: Never Used  Vaping Use  . Vaping Use: Never used  Substance and Sexual Activity  . Alcohol use: Yes    Comment: social  . Drug use: No  . Sexual activity: Yes

## 2020-12-05 ENCOUNTER — Other Ambulatory Visit: Payer: Self-pay

## 2020-12-05 ENCOUNTER — Encounter: Payer: Self-pay | Admitting: Orthopaedic Surgery

## 2020-12-05 ENCOUNTER — Ambulatory Visit (INDEPENDENT_AMBULATORY_CARE_PROVIDER_SITE_OTHER): Payer: BC Managed Care – PPO | Admitting: Orthopaedic Surgery

## 2020-12-05 VITALS — Ht 67.0 in | Wt 220.0 lb

## 2020-12-05 DIAGNOSIS — G8929 Other chronic pain: Secondary | ICD-10-CM

## 2020-12-05 DIAGNOSIS — M25511 Pain in right shoulder: Secondary | ICD-10-CM

## 2020-12-05 NOTE — Progress Notes (Signed)
Office Visit Note   Patient: Robert Blair           Date of Birth: 1955/11/29           MRN: [id number] Visit Date: 12/05/2020              Requested by: [name], [address], [place],  [place] [address] PCP: [name], MD   Assessment & Plan: Visit Diagnoses:  1. Chronic right shoulder pain     Plan: 3 weeks status post right shoulder arthroscopic SCD, DCR and release of biceps tendon.  Doing well.  Does not want to go to therapy but working on his own.  Does not appear to have adhesive capsulitis.  Some limitation of activities as expected but otherwise doing well.  Discussed self rehab and limitation of activities.  We will check him back in 1 month  Follow-Up Instructions: Return in about 1 month (around 01/05/2021).   Orders:  No orders of the defined types were placed in this encounter.  No orders of the defined types were placed in this encounter.     Procedures: No procedures performed   Clinical Data: No additional findings.   Subjective: Chief Complaint  Patient presents with  . Right Shoulder - Follow-up    Right shoulder arthroscopy 11/14/2020  Patient presents today for follow up on his right shoulder. He had a right shoulder arthroscopy with SCD, DCR, and biceps tendon release on 11/14/2020. He is now three weeks out from surgery.   HPI  Review of Systems   Objective: Vital Signs: Ht 5\' 7"  (1.702 m)   Wt 220 lb (99.8 kg)   BMI 34.46 kg/m   Physical Exam  Ortho Exam able to place right arm fully overhead with minimal discomfort.  No impingement.  Biceps sitting a little lower than its normal position consistent with the biceps release.  Skin intact.  Arthroscopic portals are healing without a problem.  No localized areas of tenderness.  Good grip and release  Specialty Comments:  No specialty comments available.  Imaging: No results found.   PMFS History: Patient Active Problem List   Diagnosis Date Noted  . Hallux  rigidus, right foot   . Pain from implanted hardware   . Hallux rigidus, left foot 07/30/2020  . Pain in right shoulder 07/30/2020  . Great toe pain, right 05/09/2020  . 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  . Unilateral primary osteoarthritis, left hip 09/22/2019  . Tendinitis, de Quervain's 06/02/2019  . Pain of left hip joint 02/07/2019  . Recurrent right inguinal hernia 10/29/2014  . Hypertension 01/01/2012  . Hypothyroidism 01/01/2012  . History of adenomatous polyp of colon 01/01/2012   Past Medical History:  Diagnosis Date  . Arthritis   . Cancer ([place]) 2016   Basal cell  . Complication of anesthesia    URINARY RETENTION  . Depression   . History of adenomatous polyp of colon    2008/  2013/  2016  . History of basal cell carcinoma excision    2015  nose  . History of urinary retention    10-30-2014  POST OP  . Hypothyroidism   . Seasonal allergies   . Unspecified essential hypertension     Family History  Problem Relation Age of Onset  . Breast cancer Mother   . Hypertension Mother   . Depression Mother   . Cancer - Other Mother        unknown GI cancer  .  Colon polyps Father   . Stroke Father   . Alzheimer's disease Father   . Colon cancer Neg Hx   . Rectal cancer Neg Hx   . Stomach cancer Neg Hx   . Liver disease Neg Hx     Past Surgical History:  Procedure Laterality Date  . ARTHRODESIS METATARSALPHALANGEAL JOINT (MTPJ) Right 08/13/2020   Procedure: FUSION RIGHT GREAT TOE METATARSALPHALANGEAL JOINT (MTPJ);  Surgeon: Newt Minion, MD;  Location: [facility];  Service: Orthopedics;  Laterality: Right;  . COLONOSCOPY  last one 02-25-2015  . HYDROCELE EXCISION Right 08/26/2015   Procedure: RIGHT HYDROCELECTOMY ADULT;  Surgeon: Franchot Gallo, MD;  Location[facility];  Service: Urology;  Laterality: Right;  . INGUINAL HERNIA REPAIR Right 10/29/2014   Procedure: LAPAROSCOPIC REPAIR RECURRENT RIGHT INGUINAL ;  Surgeon:  Fanny Skates, MD;  [facility];  Service: General;  Laterality: Right;  . INGUINAL HERNIA REPAIR Right 1984  . INSERTION OF MESH Right 10/29/2014   Procedure: INSERTION OF MESH;  Surgeon: Fanny Skates, MD;  [facility];  Service: General;  Laterality: Right;  . INSERTION OF MESH N/A 06/17/2020   Procedure: INSERTION OF MESH;  Surgeon: Coralie Keens, MD;  Location: [facility];  Service: General;  Laterality: N/A;  . POLYPECTOMY    . TOE OSTEOTOMY Bilateral right 2002/  left 2013   great toe  . TOTAL HIP ARTHROPLASTY Left 09/22/2019   Procedure: LEFT TOTAL HIP ARTHROPLASTY ANTERIOR APPROACH;  Surgeon: Mcarthur Rossetti, MD;  [facility];  Service: Orthopedics;  Laterality: Left;  . UMBILICAL HERNIA REPAIR N/A 06/17/2020   Procedure: UMBILICAL HERNIA REPAIR;  Surgeon: Coralie Keens, MD;  Location: [facility];  Service: General;  Laterality: N/A;   Social History   Occupational History  . Occupation: self employed  Tobacco Use  . Smoking status: Never Smoker  . Smokeless tobacco: Never Used  Vaping Use  . Vaping Use: Never used  Substance and Sexual Activity  . Alcohol use: Yes    Comment: social  . Drug use: No  . Sexual activity: Yes

## 2020-12-09 ENCOUNTER — Telehealth: Payer: Self-pay | Admitting: Orthopaedic Surgery

## 2020-12-09 NOTE — Telephone Encounter (Signed)
Patient called requesting physical therapy. Please call patient about this matter at [phone].

## 2020-12-09 NOTE — Telephone Encounter (Signed)
PT for ROM and strengthening 2 weeks post biceps tendon release with SAD and DCR

## 2020-12-09 NOTE — Telephone Encounter (Signed)
Please advise of PT needs and I'll place order.

## 2020-12-10 ENCOUNTER — Other Ambulatory Visit: Payer: Self-pay

## 2020-12-10 DIAGNOSIS — M25511 Pain in right shoulder: Secondary | ICD-10-CM

## 2020-12-10 DIAGNOSIS — G8929 Other chronic pain: Secondary | ICD-10-CM

## 2020-12-10 NOTE — Telephone Encounter (Signed)
Called patient. No answer. Left message that I have placed order for PT. If he needs anything else I encouraged him to call the office and let us know.

## 2020-12-12 ENCOUNTER — Ambulatory Visit (INDEPENDENT_AMBULATORY_CARE_PROVIDER_SITE_OTHER): Payer: BC Managed Care – PPO | Admitting: Physical Therapy

## 2020-12-12 ENCOUNTER — Other Ambulatory Visit: Payer: Self-pay

## 2020-12-12 ENCOUNTER — Encounter: Payer: Self-pay | Admitting: Physical Therapy

## 2020-12-12 DIAGNOSIS — R293 Abnormal posture: Secondary | ICD-10-CM | POA: Diagnosis not present

## 2020-12-12 DIAGNOSIS — M25511 Pain in right shoulder: Secondary | ICD-10-CM

## 2020-12-12 DIAGNOSIS — M6281 Muscle weakness (generalized): Secondary | ICD-10-CM

## 2020-12-12 DIAGNOSIS — M25611 Stiffness of right shoulder, not elsewhere classified: Secondary | ICD-10-CM | POA: Diagnosis not present

## 2020-12-12 NOTE — Patient Instructions (Signed)
Access Code: [email or web address] URL: [email or web address] Date: 12/12/2020 Prepared by: Faustino Congress  Exercises Standing Shoulder Abduction Slides at Wall - 1 x daily - 7 x weekly - 3 sets - 10 reps Standing Single Arm Shoulder Flexion Stretch on Wall - 1 x daily - 7 x weekly - 3 sets - 10 reps Sidelying Shoulder Abduction Full Range of Motion with Dumbbell - 2 x daily - 7 x weekly - 3 sets - 10 reps Supine Shoulder Flexion with Free Weight - 2 x daily - 7 x weekly - 3 sets - 10 reps Shoulder External Rotation with Anchored Resistance - 2 x daily - 7 x weekly - 3 sets - 10 reps Standing Shoulder Internal Rotation with Anchored Resistance - 2 x daily - 7 x weekly - 3 sets - 10 reps

## 2020-12-12 NOTE — Therapy (Signed)
[facility] [address] [place], [place], [address] Phone: [phone]   Fax:  [phone]  Physical Therapy Evaluation  Patient Details  Name: Robert Blair MRN: [id number] Date of Birth: [birth year] Referring Provider (PT): Garald Balding, MD   Encounter Date: 12/12/2020   PT End of Session - 12/12/20 1246    Visit Number 1    Number of Visits 8    Date for PT Re-Evaluation 02/06/21    Authorization Type BCBS    Authorization - Number of Visits 30    PT Start Time 1145    PT Stop Time 1225    PT Time Calculation (min) 40 min    Activity Tolerance Patient tolerated treatment well    Behavior During Therapy [facility] for tasks assessed/performed           Past Medical History:  Diagnosis Date  . Arthritis   . Cancer ([place]) 2016   Basal cell  . Complication of anesthesia    URINARY RETENTION  . Depression   . History of adenomatous polyp of colon    2008/  2013/  2016  . History of basal cell carcinoma excision    2015  nose  . History of urinary retention    10-30-2014  POST OP  . Hypothyroidism   . Seasonal allergies   . Unspecified essential hypertension     Past Surgical History:  Procedure Laterality Date  . ARTHRODESIS METATARSALPHALANGEAL JOINT (MTPJ) Right 08/13/2020   Procedure: FUSION RIGHT GREAT TOE METATARSALPHALANGEAL JOINT (MTPJ);  Surgeon: Newt Minion, MD;  Location: [place];  Service: Orthopedics;  Laterality: Right;  . COLONOSCOPY  last one 02-25-2015  . HYDROCELE EXCISION Right 08/26/2015   Procedure: RIGHT HYDROCELECTOMY ADULT;  Surgeon: Franchot Gallo, MD;  Location: [facility];  Service: Urology;  Laterality: Right;  . INGUINAL HERNIA REPAIR Right 10/29/2014   Procedure: LAPAROSCOPIC REPAIR RECURRENT RIGHT INGUINAL ;  Surgeon: Fanny Skates, MD;  Location: [facility];  Service: General;  Laterality: Right;  . INGUINAL HERNIA REPAIR Right 1984  . INSERTION OF MESH Right  10/29/2014   Procedure: INSERTION OF MESH;  Surgeon: Fanny Skates, MD;  Location: [facility];  Service: General;  Laterality: Right;  . INSERTION OF MESH N/A 06/17/2020   Procedure: INSERTION OF MESH;  Surgeon: Coralie Keens, MD;  Location: [place];  Service: General;  Laterality: N/A;  . POLYPECTOMY    . TOE OSTEOTOMY Bilateral right 2002/  left 2013   great toe  . TOTAL HIP ARTHROPLASTY Left 09/22/2019   Procedure: LEFT TOTAL HIP ARTHROPLASTY ANTERIOR APPROACH;  Surgeon: Mcarthur Rossetti, MD;  Location: [facility];  Service: Orthopedics;  Laterality: Left;  . UMBILICAL HERNIA REPAIR N/A 06/17/2020   Procedure: UMBILICAL HERNIA REPAIR;  Surgeon: Coralie Keens, MD;  Location: [place];  Service: General;  Laterality: N/A;    There were no vitals filed for this visit.    Subjective Assessment - 12/12/20 1149    Subjective Pt is a 65 y/o male who presents to OPPT s/p Rt SAD, DCR and biceps tendon release on 11/14/20.  He presents today with limitations in pain and ROM limitations.    Patient Stated Goals exercises, see how he's doing from a post-op stand point    Currently in Pain? No/denies    Pain Score 0-No pain              [facility] PT Assessment - 12/12/20 1152  Assessment   Medical Diagnosis M25.511,G89.29 (ICD-10-CM) - Chronic right shoulder pain    Referring Provider (PT) Garald Balding, MD    Onset Date/Surgical Date 11/14/20    Hand Dominance Right    Next MD Visit 01/02/21    Prior Therapy none recently      Precautions   Precautions None      Restrictions   Weight Bearing Restrictions No      Balance Screen   Has the patient fallen in the past 6 months No    Has the patient had a decrease in activity level because of a fear of falling?  No    Is the patient reluctant to leave their home because of a fear of falling?  No      Home Ecologist residence    Living Arrangements  Spouse/significant other    Additional Comments I with ADLs      Prior Function   Level of Independence Independent    Vocation Retired    [employer]    Leisure yard work, Careers information officer, tennis, squash; hasn't been to gym in 2 years due to COVID/other ortho conditions      Cognition   Overall Cognitive Status Within Functional Limits for tasks assessed      Observation/Other Assessments   Focus on Therapeutic Outcomes (FOTO)  53 (predicted 71)      Posture/Postural Control   Posture/Postural Control Postural limitations    Postural Limitations Rounded Shoulders;Forward head    Posture Comments bil popeye deformity      ROM / Strength   AROM / PROM / Strength AROM;Strength;PROM      AROM   AROM Assessment Site Shoulder    Right/Left Shoulder Right;Left    Right Shoulder Flexion 153 Degrees    Right Shoulder ABduction 128 Degrees    Right Shoulder Internal Rotation --   FIR to Rt iliac crest   Right Shoulder External Rotation --   FER to back of head   Left Shoulder Internal Rotation --   T12   Left Shoulder External Rotation --   FER to C7     PROM   PROM Assessment Site Shoulder    Right/Left Shoulder Right    Right Shoulder Flexion 163 Degrees    Right Shoulder ABduction 180 Degrees      Strength   Strength Assessment Site Shoulder    Right/Left Shoulder Right;Left    Right Shoulder Flexion --   29.67#   Right Shoulder ABduction --   1 rep: 8# unable to do more   Right Shoulder External Rotation --   26.87#   Left Shoulder Flexion --   44.1#   Left Shoulder ABduction --   37.87#   Left Shoulder External Rotation --   40.3#                     Objective measurements completed on examination: See above findings.       [facility] Adult PT Treatment/Exercise - 12/12/20 1152      Exercises   Exercises Other Exercises    Other Exercises  see pt instructions- demonstrated and pt performed exercises x 3-8 reps each                   PT Education - 12/12/20 1246    Education Details HEP    Person(s) Educated Patient    Methods Explanation;Demonstration;Handout    Comprehension Verbalized understanding;Returned demonstration;Need further  instruction            PT Short Term Goals - 12/12/20 1249      PT SHORT TERM GOAL #1   Title independent with initial HEP    Status New    Target Date 01/09/21      PT SHORT TERM GOAL #2   Title Rt shoulder flex and abdct AROM improved to WNL for improved function    Status New    Target Date 01/09/21             PT Long Term Goals - 12/12/20 1249      PT LONG TERM GOAL #1   Title independent with final HEP    Status New    Target Date 02/06/21      PT LONG TERM GOAL #2   Title FOTO score improved to at least 71 for improved function    Status New    Target Date 02/06/21      PT LONG TERM GOAL #3   Title demonstrate at least 10# improvement in Rt shoulder strength for improved function and activity tolerance    Status New    Target Date 02/06/21      PT LONG TERM GOAL #4   Title report 75% improvement in sleep on Rt side for improved pain and function    Status New    Target Date 02/06/21                  Plan - 12/12/20 1211    Clinical Impression Statement Pt is a 65 y/o male who presents to OPPT s/p Rt shoulder SAD, DCR and biceps tendon release on 11/14/20.  He demonstrates postural abnormalities, decreased strength and ROM as well as expected post op pain affecting functional mobility.  Pt will benefit from PT to address deficits listed.    Personal Factors and Comorbidities Comorbidity 3+    Comorbidities depression, hx basal cell cancer, HTN, multiple orthopedic surgeries    Examination-Activity Limitations Sleep;Carry;Lift;Reach Overhead    Examination-Participation Restrictions Community Activity;Yard Work    Merchant navy officer Evolving/Moderate complexity    Clinical Decision Making Moderate    Rehab  Potential Good    PT Frequency 1x / week    PT Duration 8 weeks    PT Treatment/Interventions ADLs/Self Care Home Management;Cryotherapy;Electrical Stimulation;Iontophoresis 4mg /ml Dexamethasone;Moist Heat;Therapeutic exercise;Therapeutic activities;Functional mobility training;Ultrasound;Neuromuscular re-education;Patient/family education;Manual techniques;Taping;Dry needling;Passive range of motion    PT Next Visit Plan review HEP, progress strengthening as able    PT Home Exercise Plan Access Code: [email or web address]    Consulted and Agree with Plan of Care Patient           Patient will benefit from skilled therapeutic intervention in order to improve the following deficits and impairments:  Decreased strength,Decreased range of motion,Postural dysfunction,Impaired UE functional use,Pain  Visit Diagnosis: Acute pain of right shoulder - Plan: PT plan of care cert/re-cert  Stiffness of right shoulder, not elsewhere classified - Plan: PT plan of care cert/re-cert  Abnormal posture - Plan: PT plan of care cert/re-cert  Muscle weakness (generalized) - Plan: PT plan of care cert/re-cert     Problem List Patient Active Problem List   Diagnosis Date Noted  . Hallux rigidus, right foot   . Pain from implanted hardware   . Hallux rigidus, left foot 07/30/2020  . Pain in right shoulder 07/30/2020  . Great toe pain, right 05/09/2020  . 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  . Unilateral primary osteoarthritis, left hip  09/22/2019  . Tendinitis, de Quervain's 06/02/2019  . Pain of left hip joint 02/07/2019  . Recurrent right inguinal hernia 10/29/2014  . Hypertension 01/01/2012  . Hypothyroidism 01/01/2012  . History of adenomatous polyp of colon 01/01/2012      Laureen Abrahams, PT, DPT 12/12/20 12:53 PM     [facility] Therapy [address] [place], [place], [address] Phone: [phone]   Fax:  [phone]  Name: Robert Blair MRN:  [id number] Date of Birth: 12/28/1955

## 2020-12-16 ENCOUNTER — Other Ambulatory Visit: Payer: Self-pay

## 2020-12-16 ENCOUNTER — Encounter: Payer: Self-pay | Admitting: Physical Therapy

## 2020-12-16 ENCOUNTER — Ambulatory Visit (INDEPENDENT_AMBULATORY_CARE_PROVIDER_SITE_OTHER): Payer: BC Managed Care – PPO | Admitting: Physical Therapy

## 2020-12-16 DIAGNOSIS — M6281 Muscle weakness (generalized): Secondary | ICD-10-CM | POA: Diagnosis not present

## 2020-12-16 DIAGNOSIS — M25611 Stiffness of right shoulder, not elsewhere classified: Secondary | ICD-10-CM

## 2020-12-16 DIAGNOSIS — M25511 Pain in right shoulder: Secondary | ICD-10-CM

## 2020-12-16 DIAGNOSIS — R293 Abnormal posture: Secondary | ICD-10-CM

## 2020-12-16 NOTE — Therapy (Signed)
[facility] [address] [place], [place], [address] Phone: [phone]   Fax:  [phone]  Physical Therapy Treatment  Patient Details  Name: Robert Blair MRN: [id number] Date of Birth: [birth year] Referring Provider (PT): Garald Balding, MD   Encounter Date: 12/16/2020   PT End of Session - 12/16/20 1012    Visit Number 2    Number of Visits 8    Date for PT Re-Evaluation 02/06/21    Authorization Type BCBS    Authorization - Number of Visits 30    PT Start Time [phone]    PT Stop Time 1014    PT Time Calculation (min) 38 min    Activity Tolerance Patient tolerated treatment well    Behavior During Therapy [facility] for tasks assessed/performed           Past Medical History:  Diagnosis Date  . Arthritis   . Cancer ([place]) 2016   Basal cell  . Complication of anesthesia    URINARY RETENTION  . Depression   . History of adenomatous polyp of colon    2008/  2013/  2016  . History of basal cell carcinoma excision    2015  nose  . History of urinary retention    10-30-2014  POST OP  . Hypothyroidism   . Seasonal allergies   . Unspecified essential hypertension     Past Surgical History:  Procedure Laterality Date  . ARTHRODESIS METATARSALPHALANGEAL JOINT (MTPJ) Right 08/13/2020   Procedure: FUSION RIGHT GREAT TOE METATARSALPHALANGEAL JOINT (MTPJ);  Surgeon: Newt Minion, MD;  Location: [place];  Service: Orthopedics;  Laterality: Right;  . COLONOSCOPY  last one 02-25-2015  . HYDROCELE EXCISION Right 08/26/2015   Procedure: RIGHT HYDROCELECTOMY ADULT;  Surgeon: Franchot Gallo, MD;  Location: [facility];  Service: Urology;  Laterality: Right;  . INGUINAL HERNIA REPAIR Right 10/29/2014   Procedure: LAPAROSCOPIC REPAIR RECURRENT RIGHT INGUINAL ;  Surgeon: Fanny Skates, MD;  [facility];  Service: General;  Laterality: Right;  . INGUINAL HERNIA REPAIR Right 1984  . INSERTION OF MESH Right  10/29/2014   Procedure: INSERTION OF MESH;  Surgeon: Fanny Skates, MD;  Location[facility];  Service: General;  Laterality: Right;  . INSERTION OF MESH N/A 06/17/2020   Procedure: INSERTION OF MESH;  Surgeon: Coralie Keens, MD;  Location: [place];  Service: General;  Laterality: N/A;  . POLYPECTOMY    . TOE OSTEOTOMY Bilateral right 2002/  left 2013   great toe  . TOTAL HIP ARTHROPLASTY Left 09/22/2019   Procedure: LEFT TOTAL HIP ARTHROPLASTY ANTERIOR APPROACH;  Surgeon: Mcarthur Rossetti, MD;  Location[facility];  Service: Orthopedics;  Laterality: Left;  . UMBILICAL HERNIA REPAIR N/A 06/17/2020   Procedure: UMBILICAL HERNIA REPAIR;  Surgeon: Coralie Keens, MD;  Location: [place];  Service: General;  Laterality: N/A;    There were no vitals filed for this visit.   Subjective Assessment - 12/16/20 0939    Subjective worked on patio all weekend, so shoulder is sore.  no pain today    Patient Stated Goals exercises, see how he's doing from a post-op stand point    Currently in Pain? No/denies              [facility] PT Assessment - 12/16/20 0942      Assessment   Medical Diagnosis M25.511,G89.29 (ICD-10-CM) - Chronic right shoulder pain    Referring Provider (PT) Garald Balding, MD  [facility] Adult PT Treatment/Exercise - 12/16/20 0939      Blood Flow Restriction   Blood Flow Restriction Yes      Blood Flow Restriction-Positions    Blood Flow Restriction Position Supine      BFR-Supine   Supine Limb Occulsion Pressure (mmHg) 164    Supine Exercise Pressure (mmHg) 82    Supine Exercise Prescription 30,15,15,15, reps w/ 30-60 sec rest      Exercises   Exercises Shoulder      Shoulder Exercises: Supine   Flexion Right   BFR     Shoulder Exercises: Sidelying   ABduction Right   BFR     Shoulder Exercises: Pulleys   Flexion 3 minutes    Scaption 3 minutes                    PT  Short Term Goals - 12/16/20 1012      PT SHORT TERM GOAL #1   Title independent with initial HEP    Status On-going    Target Date 01/09/21      PT SHORT TERM GOAL #2   Title Rt shoulder flex and abdct AROM improved to WNL for improved function    Status On-going    Target Date 01/09/21             PT Long Term Goals - 12/12/20 1249      PT LONG TERM GOAL #1   Title independent with final HEP    Status New    Target Date 02/06/21      PT LONG TERM GOAL #2   Title FOTO score improved to at least 71 for improved function    Status New    Target Date 02/06/21      PT LONG TERM GOAL #3   Title demonstrate at least 10# improvement in Rt shoulder strength for improved function and activity tolerance    Status New    Target Date 02/06/21      PT LONG TERM GOAL #4   Title report 75% improvement in sleep on Rt side for improved pain and function    Status New    Target Date 02/06/21                 Plan - 12/16/20 1012    Clinical Impression Statement Pt tolerated session well today with initiation of blood flow restriction, which he tolerated well.  Will continue to benefit from PT to maximize function.    Personal Factors and Comorbidities Comorbidity 3+    Comorbidities depression, hx basal cell cancer, HTN, multiple orthopedic surgeries    Examination-Activity Limitations Sleep;Carry;Lift;Reach Overhead    Examination-Participation Restrictions Community Activity;Yard Work    Merchant navy officer Evolving/Moderate complexity    Rehab Potential Good    PT Frequency 1x / week    PT Duration 8 weeks    PT Treatment/Interventions ADLs/Self Care Home Management;Cryotherapy;Electrical Stimulation;Iontophoresis 4mg /ml Dexamethasone;Moist Heat;Therapeutic exercise;Therapeutic activities;Functional mobility training;Ultrasound;Neuromuscular re-education;Patient/family education;Manual techniques;Taping;Dry needling;Passive range of motion    PT Next Visit  Plan review HEP, progress strengthening as able, BFR    PT Home Exercise Plan Access Code: [email or web address]    Consulted and Agree with Plan of Care Patient           Patient will benefit from skilled therapeutic intervention in order to improve the following deficits and impairments:  Decreased strength,Decreased range of motion,Postural dysfunction,Impaired UE functional use,Pain  Visit Diagnosis: Acute pain of right shoulder  Stiffness of  right shoulder, not elsewhere classified  Abnormal posture  Muscle weakness (generalized)     Problem List Patient Active Problem List   Diagnosis Date Noted  . Hallux rigidus, right foot   . Pain from implanted hardware   . Hallux rigidus, left foot 07/30/2020  . Pain in right shoulder 07/30/2020  . Great toe pain, right 05/09/2020  . 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  . Unilateral primary osteoarthritis, left hip 09/22/2019  . Tendinitis, de Quervain's 06/02/2019  . Pain of left hip joint 02/07/2019  . Recurrent right inguinal hernia 10/29/2014  . Hypertension 01/01/2012  . Hypothyroidism 01/01/2012  . History of adenomatous polyp of colon 01/01/2012      Laureen Abrahams, PT, DPT 12/16/20 10:14 AM    [facility] Physical Therapy [address] [place], [place], [address] Phone: [phone]   Fax:  [phone]  Name: Robert Blair MRN: [id number] Date of Birth: December 25, 1955

## 2020-12-17 ENCOUNTER — Ambulatory Visit: Payer: BC Managed Care – PPO | Admitting: Rehabilitative and Restorative Service Providers"

## 2020-12-26 ENCOUNTER — Ambulatory Visit (INDEPENDENT_AMBULATORY_CARE_PROVIDER_SITE_OTHER): Payer: BC Managed Care – PPO | Admitting: Physical Therapy

## 2020-12-26 ENCOUNTER — Encounter: Payer: Self-pay | Admitting: Physical Therapy

## 2020-12-26 ENCOUNTER — Other Ambulatory Visit: Payer: Self-pay

## 2020-12-26 DIAGNOSIS — M25611 Stiffness of right shoulder, not elsewhere classified: Secondary | ICD-10-CM

## 2020-12-26 DIAGNOSIS — M25511 Pain in right shoulder: Secondary | ICD-10-CM | POA: Diagnosis not present

## 2020-12-26 DIAGNOSIS — M6281 Muscle weakness (generalized): Secondary | ICD-10-CM | POA: Diagnosis not present

## 2020-12-26 DIAGNOSIS — R293 Abnormal posture: Secondary | ICD-10-CM | POA: Diagnosis not present

## 2020-12-26 NOTE — Therapy (Signed)
[facility] [address] [place], [place], [address] Phone: [phone]   Fax:  [phone]  Physical Therapy Treatment  Patient Details  Name: Robert Blair MRN: [id number] Date of Birth: [birth year] Referring Provider (PT): Garald Balding, MD   Encounter Date: 12/26/2020   PT End of Session - 12/26/20 1425    Visit Number 3    Number of Visits 8    Date for PT Re-Evaluation 02/06/21    Authorization Type BCBS    Authorization - Number of Visits 30    PT Start Time 1340    PT Stop Time 1425    PT Time Calculation (min) 45 min    Activity Tolerance Patient tolerated treatment well    Behavior During Therapy [facility] for tasks assessed/performed           Past Medical History:  Diagnosis Date  . Arthritis   . Cancer ([place]) 2016   Basal cell  . Complication of anesthesia    URINARY RETENTION  . Depression   . History of adenomatous polyp of colon    2008/  2013/  2016  . History of basal cell carcinoma excision    2015  nose  . History of urinary retention    10-30-2014  POST OP  . Hypothyroidism   . Seasonal allergies   . Unspecified essential hypertension     Past Surgical History:  Procedure Laterality Date  . ARTHRODESIS METATARSALPHALANGEAL JOINT (MTPJ) Right 08/13/2020   Procedure: FUSION RIGHT GREAT TOE METATARSALPHALANGEAL JOINT (MTPJ);  Surgeon: Newt Minion, MD;  Location: [place];  Service: Orthopedics;  Laterality: Right;  . COLONOSCOPY  last one 02-25-2015  . HYDROCELE EXCISION Right 08/26/2015   Procedure: RIGHT HYDROCELECTOMY ADULT;  Surgeon: Franchot Gallo, MD;  Location: [facility];  Service: Urology;  Laterality: Right;  . INGUINAL HERNIA REPAIR Right 10/29/2014   Procedure: LAPAROSCOPIC REPAIR RECURRENT RIGHT INGUINAL ;  Surgeon: Fanny Skates, MD;  [facility];  Service: General;  Laterality: Right;  . INGUINAL HERNIA REPAIR Right 1984  . INSERTION OF MESH Right  10/29/2014   Procedure: INSERTION OF MESH;  Surgeon: Fanny Skates, MD;  Location[facility];  Service: General;  Laterality: Right;  . INSERTION OF MESH N/A 06/17/2020   Procedure: INSERTION OF MESH;  Surgeon: Coralie Keens, MD;  Location: [place];  Service: General;  Laterality: N/A;  . POLYPECTOMY    . TOE OSTEOTOMY Bilateral right 2002/  left 2013   great toe  . TOTAL HIP ARTHROPLASTY Left 09/22/2019   Procedure: LEFT TOTAL HIP ARTHROPLASTY ANTERIOR APPROACH;  Surgeon: Mcarthur Rossetti, MD;  [facility];  Service: Orthopedics;  Laterality: Left;  . UMBILICAL HERNIA REPAIR N/A 06/17/2020   Procedure: UMBILICAL HERNIA REPAIR;  Surgeon: Coralie Keens, MD;  Location: [place];  Service: General;  Laterality: N/A;    There were no vitals filed for this visit.   Subjective Assessment - 12/26/20 1339    Subjective having a lot of aching pain, but past vew days have been fine.    Patient Stated Goals exercises, see how he's doing from a post-op stand point    Currently in Pain? No/denies              [facility] PT Assessment - 12/26/20 1419      Assessment   Medical Diagnosis M25.511,G89.29 (ICD-10-CM) - Chronic right shoulder pain    Referring Provider (PT) Garald Balding, MD  Onset Date/Surgical Date 11/14/20      AROM   Right Shoulder Flexion 160 Degrees    Right Shoulder ABduction 180 Degrees                         [facility] Adult PT Treatment/Exercise - 12/26/20 1344      BFR-Supine   Supine Limb Occulsion Pressure (mmHg) 180    Supine Exercise Pressure (mmHg) 90    Supine Exercise Prescription 30,15,15,15, reps w/ 30-60 sec rest      Shoulder Exercises: Supine   Flexion Right   BFR     Shoulder Exercises: Sidelying   ABduction Right   BFR     Shoulder Exercises: Pulleys   Flexion 3 minutes    Scaption 3 minutes      Shoulder Exercises: ROM/Strengthening   UBE (Upper Arm Bike) L4 x 8 min (alt fwd/bwd  every 2 min)                    PT Short Term Goals - 12/26/20 1425      PT SHORT TERM GOAL #1   Title independent with initial HEP    Status Achieved    Target Date 01/09/21      PT SHORT TERM GOAL #2   Title Rt shoulder flex and abdct AROM improved to WNL for improved function    Status Achieved    Target Date 01/09/21             PT Long Term Goals - 12/12/20 1249      PT LONG TERM GOAL #1   Title independent with final HEP    Status New    Target Date 02/06/21      PT LONG TERM GOAL #2   Title FOTO score improved to at least 71 for improved function    Status New    Target Date 02/06/21      PT LONG TERM GOAL #3   Title demonstrate at least 10# improvement in Rt shoulder strength for improved function and activity tolerance    Status New    Target Date 02/06/21      PT LONG TERM GOAL #4   Title report 75% improvement in sleep on Rt side for improved pain and function    Status New    Target Date 02/06/21                 Plan - 12/26/20 1425    Clinical Impression Statement Pt has met STGs and progressing well with PT.  Flexion and abduction are now WNL and plan to continue with BFR and strengthening to return to golf and tennis.  Will continue to benefit from PT to maximize function.    Personal Factors and Comorbidities Comorbidity 3+    Comorbidities depression, hx basal cell cancer, HTN, multiple orthopedic surgeries    Examination-Activity Limitations Sleep;Carry;Lift;Reach Overhead    Examination-Participation Restrictions Community Activity;Yard Work    [employer] officer Evolving/Moderate complexity    Rehab Potential Good    PT Frequency 1x / week    PT Duration 8 weeks    PT Treatment/Interventions ADLs/Self Care Home Management;Cryotherapy;Electrical Stimulation;Iontophoresis 69m/ml Dexamethasone;Moist Heat;Therapeutic exercise;Therapeutic activities;Functional mobility training;Ultrasound;Neuromuscular  re-education;Patient/family education;Manual techniques;Taping;Dry needling;Passive range of motion    PT Next Visit Plan progress strengthening as able, BFR - see what MD says    PT Home Exercise Plan Access Code: [email or web address]   Consulted and Agree with Plan  of Care Patient           Patient will benefit from skilled therapeutic intervention in order to improve the following deficits and impairments:  Decreased strength,Decreased range of motion,Postural dysfunction,Impaired UE functional use,Pain  Visit Diagnosis: Acute pain of right shoulder  Stiffness of right shoulder, not elsewhere classified  Abnormal posture  Muscle weakness (generalized)     Problem List Patient Active Problem List   Diagnosis Date Noted  . Hallux rigidus, right foot   . Pain from implanted hardware   . Hallux rigidus, left foot 07/30/2020  . Pain in right shoulder 07/30/2020  . Great toe pain, right 05/09/2020  . 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  . Unilateral primary osteoarthritis, left hip 09/22/2019  . Tendinitis, de Quervain's 06/02/2019  . Pain of left hip joint 02/07/2019  . Recurrent right inguinal hernia 10/29/2014  . Hypertension 01/01/2012  . Hypothyroidism 01/01/2012  . History of adenomatous polyp of colon 01/01/2012      Laureen Abrahams, PT, DPT 12/26/20 2:27 PM    [facility] [address] [place], [place], [address] Phone: [phone]   Fax:  [phone]  Name: Robert Blair MRN: [id number] Date of Birth: 1956/02/17

## 2021-01-02 ENCOUNTER — Encounter: Payer: Self-pay | Admitting: Orthopaedic Surgery

## 2021-01-02 ENCOUNTER — Other Ambulatory Visit: Payer: Self-pay

## 2021-01-02 ENCOUNTER — Ambulatory Visit (INDEPENDENT_AMBULATORY_CARE_PROVIDER_SITE_OTHER): Payer: BC Managed Care – PPO | Admitting: Orthopaedic Surgery

## 2021-01-02 VITALS — Ht 67.0 in | Wt 220.0 lb

## 2021-01-02 DIAGNOSIS — M25511 Pain in right shoulder: Secondary | ICD-10-CM

## 2021-01-02 DIAGNOSIS — G8929 Other chronic pain: Secondary | ICD-10-CM

## 2021-01-02 NOTE — Progress Notes (Signed)
Office Visit Note   Patient: Robert Blair           Date of Birth: October 15, 1955           MRN: [id number] Visit Date: 01/02/2021              Requested by: [name], [place], [place],  [place] [address] PCP: [name], MD   Assessment & Plan: Visit Diagnoses:  1. Chronic right shoulder pain     Plan: 7 weeks status post right shoulder surgery that included an SCD DCR and biceps release.  Doing fairly well.  Lifted 20 bales of pine bark mulch the other day and is little bit sore but notes that he overall is better than he was before surgery.  Has been doing the therapy on his own and working slowly to build up his strength.  Had a long discussion regarding activity modification and further exercise we will plan to see him back in a month  Follow-Up Instructions: Return in about 1 month (around 02/01/2021).   Orders:  No orders of the defined types were placed in this encounter.  No orders of the defined types were placed in this encounter.     Procedures: No procedures performed   Clinical Data: No additional findings.   Subjective: Chief Complaint  Patient presents with  . Right Shoulder - Follow-up    Right shoulder arthroscopy 11/14/2020  Patient presents today for follow up on his right shoulder. He had a right shoulder arthroscopy on 11/14/2020. Patient states that he has occasional intense pain. He has found that physical therapy really helps. There are days that he has difficulty with lifting his arm, and then other days he can lift it without difficulty. He is not taking anything for pain.   HPI  Review of Systems   Objective: Vital Signs: Ht 5\' 7"  (1.702 m)   Wt 220 lb (99.8 kg)   BMI 34.46 kg/m   Physical Exam  Ortho Exam awake alert and oriented x3.  Comfortable sitting.  Had difficulty raising his right arm over his head today because of his recent activities but notes that before then he could easily place his arm over his head.   He had good strength good grip and release.  Passively I can place his arm over his head and abduct 90 degrees.  No pain about the biceps tendon or muscle  Specialty Comments:  No specialty comments available.  Imaging: No results found.   PMFS History: Patient Active Problem List   Diagnosis Date Noted  . Hallux rigidus, right foot   . Pain from implanted hardware   . Hallux rigidus, left foot 07/30/2020  . Pain in right shoulder 07/30/2020  . Great toe pain, right 05/09/2020  . 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  . Unilateral primary osteoarthritis, left hip 09/22/2019  . Tendinitis, de Quervain's 06/02/2019  . Pain of left hip joint 02/07/2019  . Recurrent right inguinal hernia 10/29/2014  . Hypertension 01/01/2012  . Hypothyroidism 01/01/2012  . History of adenomatous polyp of colon 01/01/2012   Past Medical History:  Diagnosis Date  . Arthritis   . Cancer ([place]) 2016   Basal cell  . Complication of anesthesia    URINARY RETENTION  . Depression   . History of adenomatous polyp of colon    2008/  2013/  2016  . History of basal cell carcinoma excision    2015  nose  . History of urinary  retention    10-30-2014  POST OP  . Hypothyroidism   . Seasonal allergies   . Unspecified essential hypertension     Family History  Problem Relation Age of Onset  . Breast cancer Mother   . Hypertension Mother   . Depression Mother   . Cancer - Other Mother        unknown GI cancer  . Colon polyps Father   . Stroke Father   . Alzheimer's disease Father   . Colon cancer Neg Hx   . Rectal cancer Neg Hx   . Stomach cancer Neg Hx   . Liver disease Neg Hx     Past Surgical History:  Procedure Laterality Date  . ARTHRODESIS METATARSALPHALANGEAL JOINT (MTPJ) Right 08/13/2020   Procedure: FUSION RIGHT GREAT TOE METATARSALPHALANGEAL JOINT (MTPJ);  Surgeon: Newt Minion, MD;  Location: [place];  Service: Orthopedics;  Laterality: Right;  . COLONOSCOPY   last one 02-25-2015  . HYDROCELE EXCISION Right 08/26/2015   Procedure: RIGHT HYDROCELECTOMY ADULT;  Surgeon: Franchot Gallo, MD;  Location: [facility];  Service: Urology;  Laterality: Right;  . INGUINAL HERNIA REPAIR Right 10/29/2014   Procedure: LAPAROSCOPIC REPAIR RECURRENT RIGHT INGUINAL ;  Surgeon: Fanny Skates, MD;  [facility];  Service: General;  Laterality: Right;  . INGUINAL HERNIA REPAIR Right 1984  . INSERTION OF MESH Right 10/29/2014   Procedure: INSERTION OF MESH;  Surgeon: Fanny Skates, MD;  [facility];  Service: General;  Laterality: Right;  . INSERTION OF MESH N/A 06/17/2020   Procedure: INSERTION OF MESH;  Surgeon: Coralie Keens, MD;  Location: [place];  Service: General;  Laterality: N/A;  . POLYPECTOMY    . TOE OSTEOTOMY Bilateral right 2002/  left 2013   great toe  . TOTAL HIP ARTHROPLASTY Left 09/22/2019   Procedure: LEFT TOTAL HIP ARTHROPLASTY ANTERIOR APPROACH;  Surgeon: Mcarthur Rossetti, MD;  [facility];  Service: Orthopedics;  Laterality: Left;  . UMBILICAL HERNIA REPAIR N/A 06/17/2020   Procedure: UMBILICAL HERNIA REPAIR;  Surgeon: Coralie Keens, MD;  Location: [place];  Service: General;  Laterality: N/A;   Social History   Occupational History  . Occupation: self employed  Tobacco Use  . Smoking status: Never Smoker  . Smokeless tobacco: Never Used  Vaping Use  . Vaping Use: Never used  Substance and Sexual Activity  . Alcohol use: Yes    Comment: social  . Drug use: No  . Sexual activity: Yes

## 2021-01-09 ENCOUNTER — Ambulatory Visit (INDEPENDENT_AMBULATORY_CARE_PROVIDER_SITE_OTHER): Payer: BC Managed Care – PPO | Admitting: Physical Therapy

## 2021-01-09 ENCOUNTER — Encounter: Payer: Self-pay | Admitting: Physical Therapy

## 2021-01-09 ENCOUNTER — Other Ambulatory Visit: Payer: Self-pay

## 2021-01-09 DIAGNOSIS — R293 Abnormal posture: Secondary | ICD-10-CM | POA: Diagnosis not present

## 2021-01-09 DIAGNOSIS — M25611 Stiffness of right shoulder, not elsewhere classified: Secondary | ICD-10-CM | POA: Diagnosis not present

## 2021-01-09 DIAGNOSIS — M25511 Pain in right shoulder: Secondary | ICD-10-CM | POA: Diagnosis not present

## 2021-01-09 DIAGNOSIS — M6281 Muscle weakness (generalized): Secondary | ICD-10-CM

## 2021-01-09 NOTE — Therapy (Signed)
[facility] [address] [place], [place], [address] Phone: [phone]   Fax:  [phone]  Physical Therapy Treatment  Patient Details  Name: Robert Blair MRN: [id number] Date of Birth: May 24, 1956 Referring Provider (PT): Garald Balding, MD   Encounter Date: 01/09/2021   PT End of Session - 01/09/21 0926    Visit Number 4    Number of Visits 8    Date for PT Re-Evaluation 02/06/21    Authorization Type BCBS    Authorization - Number of Visits 30    PT Start Time [id number]    PT Stop Time 0926    PT Time Calculation (min) 43 min    Activity Tolerance Patient tolerated treatment well    Behavior During Therapy [facility] for tasks assessed/performed           Past Medical History:  Diagnosis Date  . Arthritis   . Cancer ([place]) 2016   Basal cell  . Complication of anesthesia    URINARY RETENTION  . Depression   . History of adenomatous polyp of colon    2008/  2013/  2016  . History of basal cell carcinoma excision    2015  nose  . History of urinary retention    10-30-2014  POST OP  . Hypothyroidism   . Seasonal allergies   . Unspecified essential hypertension     Past Surgical History:  Procedure Laterality Date  . ARTHRODESIS METATARSALPHALANGEAL JOINT (MTPJ) Right 08/13/2020   Procedure: FUSION RIGHT GREAT TOE METATARSALPHALANGEAL JOINT (MTPJ);  Surgeon: Newt Minion, MD;  Location: [place];  Service: Orthopedics;  Laterality: Right;  . COLONOSCOPY  last one 02-25-2015  . HYDROCELE EXCISION Right 08/26/2015   Procedure: RIGHT HYDROCELECTOMY ADULT;  Surgeon: Franchot Gallo, MD;  Location: [facility];  Service: Urology;  Laterality: Right;  . INGUINAL HERNIA REPAIR Right 10/29/2014   Procedure: LAPAROSCOPIC REPAIR RECURRENT RIGHT INGUINAL ;  Surgeon: Fanny Skates, MD;  [facility];  Service: General;  Laterality: Right;  . INGUINAL HERNIA REPAIR Right 1984  . INSERTION OF MESH Right  10/29/2014   Procedure: INSERTION OF MESH;  Surgeon: Fanny Skates, MD;  [facility];  Service: General;  Laterality: Right;  . INSERTION OF MESH N/A 06/17/2020   Procedure: INSERTION OF MESH;  Surgeon: Coralie Keens, MD;  Location: [place];  Service: General;  Laterality: N/A;  . POLYPECTOMY    . TOE OSTEOTOMY Bilateral right 2002/  left 2013   great toe  . TOTAL HIP ARTHROPLASTY Left 09/22/2019   Procedure: LEFT TOTAL HIP ARTHROPLASTY ANTERIOR APPROACH;  Surgeon: Mcarthur Rossetti, MD;  [facility];  Service: Orthopedics;  Laterality: Left;  . UMBILICAL HERNIA REPAIR N/A 06/17/2020   Procedure: UMBILICAL HERNIA REPAIR;  Surgeon: Coralie Keens, MD;  Location: [place];  Service: General;  Laterality: N/A;    There were no vitals filed for this visit.   Subjective Assessment - 01/09/21 0851    Subjective lifting mulch caused shoulder to be really sore, but much better now.    Patient Stated Goals exercises, see how he's doing from a post-op stand point    Currently in Pain? No/denies                             [facility] Adult PT Treatment/Exercise - 01/09/21 0847      Blood Flow Restriction-Positions    Blood  Flow Restriction Position Standing      BFR Standing   Standing Limb Occulsion Pressure (mmHg) 160    Standing Exercise Pressure (mmHg) 80    Standing Exercise Prescription 30,15,15,15, reps w/ 30-60 sec rest    Standing Exercise Prescription Comment cuff size 2      Shoulder Exercises: Standing   Flexion Right   BFR   ABduction Right;15 reps   BFR, 4 sets of 15 reps   Other Standing Exercises IR/ER walkout 2x20 reps; L4 band      Shoulder Exercises: ROM/Strengthening   UBE (Upper Arm Bike) L4 x 8 min (alt fwd/bwd every 2 min)                    PT Short Term Goals - 12/26/20 1425      PT SHORT TERM GOAL #1   Title independent with initial HEP    Status Achieved    Target Date 01/09/21       PT SHORT TERM GOAL #2   Title Rt shoulder flex and abdct AROM improved to WNL for improved function    Status Achieved    Target Date 01/09/21             PT Long Term Goals - 12/12/20 1249      PT LONG TERM GOAL #1   Title independent with final HEP    Status New    Target Date 02/06/21      PT LONG TERM GOAL #2   Title FOTO score improved to at least 71 for improved function    Status New    Target Date 02/06/21      PT LONG TERM GOAL #3   Title demonstrate at least 10# improvement in Rt shoulder strength for improved function and activity tolerance    Status New    Target Date 02/06/21      PT LONG TERM GOAL #4   Title report 75% improvement in sleep on Rt side for improved pain and function    Status New    Target Date 02/06/21                 Plan - 01/09/21 0926    Clinical Impression Statement Improved Rt shoulder function compared to last MD visit as arm has slowly recovered from increased activity.  Will continue to benefit from PT to maximize function.    Personal Factors and Comorbidities Comorbidity 3+    Comorbidities depression, hx basal cell cancer, HTN, multiple orthopedic surgeries    Examination-Activity Limitations Sleep;Carry;Lift;Reach Overhead    Examination-Participation Restrictions Community Activity;Yard Work    [employer] officer Evolving/Moderate complexity    Rehab Potential Good    PT Frequency 1x / week    PT Duration 8 weeks    PT Treatment/Interventions ADLs/Self Care Home Management;Cryotherapy;Electrical Stimulation;Iontophoresis 4mg /ml Dexamethasone;Moist Heat;Therapeutic exercise;Therapeutic activities;Functional mobility training;Ultrasound;Neuromuscular re-education;Patient/family education;Manual techniques;Taping;Dry needling;Passive range of motion    PT Next Visit Plan progress strengthening as able; review HEP and update PRN    PT Home Exercise Plan Access Code: [email or web address]    Consulted and Agree  with Plan of Care Patient           Patient will benefit from skilled therapeutic intervention in order to improve the following deficits and impairments:  Decreased strength,Decreased range of motion,Postural dysfunction,Impaired UE functional use,Pain  Visit Diagnosis: Acute pain of right shoulder  Stiffness of right shoulder, not elsewhere classified  Abnormal posture  Muscle weakness (generalized)  Problem List Patient Active Problem List   Diagnosis Date Noted  . Hallux rigidus, right foot   . Pain from implanted hardware   . Hallux rigidus, left foot 07/30/2020  . Pain in right shoulder 07/30/2020  . Great toe pain, right 05/09/2020  . 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  . Unilateral primary osteoarthritis, left hip 09/22/2019  . Tendinitis, de Quervain's 06/02/2019  . Pain of left hip joint 02/07/2019  . Recurrent right inguinal hernia 10/29/2014  . Hypertension 01/01/2012  . Hypothyroidism 01/01/2012  . History of adenomatous polyp of colon 01/01/2012     Laureen Abrahams, PT, DPT 01/09/21 9:37 AM    [facility] [address] [place], [place], [address] Phone: [phone]   Fax:  [phone]  Name: MAGDIEL BARTLES MRN: [id number] Date of Birth: 17-Dec-1955

## 2021-01-16 ENCOUNTER — Encounter: Payer: Self-pay | Admitting: Physical Therapy

## 2021-01-16 ENCOUNTER — Ambulatory Visit (INDEPENDENT_AMBULATORY_CARE_PROVIDER_SITE_OTHER): Payer: BC Managed Care – PPO | Admitting: Physical Therapy

## 2021-01-16 ENCOUNTER — Other Ambulatory Visit: Payer: Self-pay

## 2021-01-16 DIAGNOSIS — R293 Abnormal posture: Secondary | ICD-10-CM

## 2021-01-16 DIAGNOSIS — M25511 Pain in right shoulder: Secondary | ICD-10-CM | POA: Diagnosis not present

## 2021-01-16 DIAGNOSIS — M6281 Muscle weakness (generalized): Secondary | ICD-10-CM | POA: Diagnosis not present

## 2021-01-16 DIAGNOSIS — M25611 Stiffness of right shoulder, not elsewhere classified: Secondary | ICD-10-CM | POA: Diagnosis not present

## 2021-01-16 NOTE — Therapy (Signed)
[facility] Physical Therapy [address] [place], [place], [address] Phone: [phone]   Fax:  [phone]  Physical Therapy Treatment  Patient Details  Name: Robert Blair MRN: [id number] Date of Birth: 1956-05-26 Referring Provider (PT): Garald Balding, MD   Encounter Date: 01/16/2021   PT End of Session - 01/16/21 1240    Visit Number 5    Number of Visits 8    Date for PT Re-Evaluation 02/06/21    Authorization Type BCBS    Authorization - Number of Visits 30    PT Start Time 1140    PT Stop Time 1220    PT Time Calculation (min) 40 min    Activity Tolerance Patient tolerated treatment well    Behavior During Therapy [facility] for tasks assessed/performed           Past Medical History:  Diagnosis Date  . Arthritis   . Cancer ([place]) 2016   Basal cell  . Complication of anesthesia    URINARY RETENTION  . Depression   . History of adenomatous polyp of colon    2008/  2013/  2016  . History of basal cell carcinoma excision    2015  nose  . History of urinary retention    10-30-2014  POST OP  . Hypothyroidism   . Seasonal allergies   . Unspecified essential hypertension     Past Surgical History:  Procedure Laterality Date  . ARTHRODESIS METATARSALPHALANGEAL JOINT (MTPJ) Right 08/13/2020   Procedure: FUSION RIGHT GREAT TOE METATARSALPHALANGEAL JOINT (MTPJ);  Surgeon: Newt Minion, MD;  Location: [place];  Service: Orthopedics;  Laterality: Right;  . COLONOSCOPY  last one 02-25-2015  . HYDROCELE EXCISION Right 08/26/2015   Procedure: RIGHT HYDROCELECTOMY ADULT;  Surgeon: Franchot Gallo, MD;  Location: [facility];  Service: Urology;  Laterality: Right;  . INGUINAL HERNIA REPAIR Right 10/29/2014   Procedure: LAPAROSCOPIC REPAIR RECURRENT RIGHT INGUINAL ;  Surgeon: Fanny Skates, MD;  [facility];  Service: General;  Laterality: Right;  . INGUINAL HERNIA REPAIR Right 1984  . INSERTION OF MESH Right  10/29/2014   Procedure: INSERTION OF MESH;  Surgeon: Fanny Skates, MD;  [facility];  Service: General;  Laterality: Right;  . INSERTION OF MESH N/A 06/17/2020   Procedure: INSERTION OF MESH;  Surgeon: Coralie Keens, MD;  Location: [place];  Service: General;  Laterality: N/A;  . POLYPECTOMY    . TOE OSTEOTOMY Bilateral right 2002/  left 2013   great toe  . TOTAL HIP ARTHROPLASTY Left 09/22/2019   Procedure: LEFT TOTAL HIP ARTHROPLASTY ANTERIOR APPROACH;  Surgeon: Mcarthur Rossetti, MD;  [facility];  Service: Orthopedics;  Laterality: Left;  . UMBILICAL HERNIA REPAIR N/A 06/17/2020   Procedure: UMBILICAL HERNIA REPAIR;  Surgeon: Coralie Keens, MD;  Location: [place];  Service: General;  Laterality: N/A;    There were no vitals filed for this visit.   Subjective Assessment - 01/16/21 1144    Subjective doing well, doesn't want to do BFR today "my arm was out for 4 days."    Patient Stated Goals exercises, see how he's doing from a post-op stand point    Currently in Pain? No/denies                             [facility] Adult PT Treatment/Exercise - 01/16/21 1144      Shoulder Exercises: Standing  External Rotation Right;Weights;20 reps    External Rotation Weight (lbs) 2    Flexion Right;Weights;10 reps    Shoulder Flexion Weight (lbs) 1    Row Both;Theraband   3x10   Theraband Level (Shoulder Row) Level 4 (Blue)    Other Standing Exercises IR/ER walkout 2x20 reps; L2 band      Shoulder Exercises: ROM/Strengthening   UBE (Upper Arm Bike) L4 x 8 min (alt fwd/bwd every 2 min)    Ranger UE ranger flexion/scaption x20 reps                    PT Short Term Goals - 12/26/20 1425      PT SHORT TERM GOAL #1   Title independent with initial HEP    Status Achieved    Target Date 01/09/21      PT SHORT TERM GOAL #2   Title Rt shoulder flex and abdct AROM improved to WNL for improved function     Status Achieved    Target Date 01/09/21             PT Long Term Goals - 12/12/20 1249      PT LONG TERM GOAL #1   Title independent with final HEP    Status New    Target Date 02/06/21      PT LONG TERM GOAL #2   Title FOTO score improved to at least 71 for improved function    Status New    Target Date 02/06/21      PT LONG TERM GOAL #3   Title demonstrate at least 10# improvement in Rt shoulder strength for improved function and activity tolerance    Status New    Target Date 02/06/21      PT LONG TERM GOAL #4   Title report 75% improvement in sleep on Rt side for improved pain and function    Status New    Target Date 02/06/21                 Plan - 01/16/21 1240    Clinical Impression Statement Pt with increased pain with active standing abduction today limiting some exercises today.  Overall still progressing well, shoulder just fatigues quickly with strengthening exercises.  Pt may want to d/c next visit if doing well.    Personal Factors and Comorbidities Comorbidity 3+    Comorbidities depression, hx basal cell cancer, HTN, multiple orthopedic surgeries    Examination-Activity Limitations Sleep;Carry;Lift;Reach Overhead    Examination-Participation Restrictions Community Activity;Yard Work    [employer] Evolving/Moderate complexity    Rehab Potential Good    PT Frequency 1x / week    PT Duration 8 weeks    PT Treatment/Interventions ADLs/Self Care Home Management;Cryotherapy;Electrical Stimulation;Iontophoresis 4mg /ml Dexamethasone;Moist Heat;Therapeutic exercise;Therapeutic activities;Functional mobility training;Ultrasound;Neuromuscular re-education;Patient/family education;Manual techniques;Taping;Dry needling;Passive range of motion    PT Next Visit Plan progress strengthening as able; review HEP and update PRN; check goals, discuss d/c or recert    PT Home Exercise Plan Access Code: [email or web address]    Consulted and Agree with Plan of  Care Patient           Patient will benefit from skilled therapeutic intervention in order to improve the following deficits and impairments:  Decreased strength,Decreased range of motion,Postural dysfunction,Impaired UE functional use,Pain  Visit Diagnosis: Acute pain of right shoulder  Stiffness of right shoulder, not elsewhere classified  Abnormal posture  Muscle weakness (generalized)     Problem List Patient Active Problem List  Diagnosis Date Noted  . Hallux rigidus, right foot   . Pain from implanted hardware   . Hallux rigidus, left foot 07/30/2020  . Pain in right shoulder 07/30/2020  . Great toe pain, right 05/09/2020  . 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  . Unilateral primary osteoarthritis, left hip 09/22/2019  . Tendinitis, de Quervain's 06/02/2019  . Pain of left hip joint 02/07/2019  . Recurrent right inguinal hernia 10/29/2014  . Hypertension 01/01/2012  . Hypothyroidism 01/01/2012  . History of adenomatous polyp of colon 01/01/2012      Laureen Abrahams, PT, DPT 01/16/21 12:42 PM    [facility] Physical Therapy [address] [place], [place], [address] Phone: [phone]   Fax:  [phone]  Name: Robert Blair MRN: [id number] Date of Birth: 1955/10/29

## 2021-01-31 ENCOUNTER — Other Ambulatory Visit: Payer: Self-pay

## 2021-01-31 ENCOUNTER — Encounter: Payer: Self-pay | Admitting: Physical Therapy

## 2021-01-31 ENCOUNTER — Ambulatory Visit (INDEPENDENT_AMBULATORY_CARE_PROVIDER_SITE_OTHER): Payer: BC Managed Care – PPO | Admitting: Physical Therapy

## 2021-01-31 DIAGNOSIS — R293 Abnormal posture: Secondary | ICD-10-CM | POA: Diagnosis not present

## 2021-01-31 DIAGNOSIS — M25511 Pain in right shoulder: Secondary | ICD-10-CM

## 2021-01-31 DIAGNOSIS — M6281 Muscle weakness (generalized): Secondary | ICD-10-CM

## 2021-01-31 DIAGNOSIS — M25611 Stiffness of right shoulder, not elsewhere classified: Secondary | ICD-10-CM | POA: Diagnosis not present

## 2021-01-31 NOTE — Patient Instructions (Signed)
Access Code: [email or web address] URL: [email or web address] Date: 01/31/2021 Prepared by: Faustino Congress  Exercises Sidelying Shoulder Abduction Full Range of Motion with Dumbbell - 2 x daily - 7 x weekly - 3 sets - 10 reps Supine Shoulder Flexion with Free Weight - 2 x daily - 7 x weekly - 3 sets - 10 reps Shoulder External Rotation with Anchored Resistance - 2 x daily - 7 x weekly - 3 sets - 10 reps Standing Shoulder Internal Rotation with Anchored Resistance - 2 x daily - 7 x weekly - 3 sets - 10 reps Standing Shoulder Flexion to 90 Degrees with Dumbbells - 1 x daily - 7 x weekly - 3 sets - 10 reps Standing Shoulder Horizontal Abduction with Dumbbells - Thumbs Up - 1 x daily - 7 x weekly - 3 sets - 10 reps

## 2021-01-31 NOTE — Therapy (Signed)
[facility] Physical Therapy [address] [place], [place], [address] Phone: [phone]   Fax:  [phone]  Physical Therapy Treatment/Discharge Summary  Patient Details  Name: Robert Blair MRN: [id number] Date of Birth: 1956-07-27 Referring Provider (PT): Garald Balding, MD   Encounter Date: 01/31/2021   PT End of Session - 01/31/21 0907    Visit Number 6    Authorization Type BCBS    Authorization - Number of Visits 30    PT Start Time 0840    PT Stop Time 0905    PT Time Calculation (min) 25 min    Activity Tolerance Patient tolerated treatment well    Behavior During Therapy [facility] for tasks assessed/performed           Past Medical History:  Diagnosis Date  . Arthritis   . Cancer ([place]) 2016   Basal cell  . Complication of anesthesia    URINARY RETENTION  . Depression   . History of adenomatous polyp of colon    2008/  2013/  2016  . History of basal cell carcinoma excision    2015  nose  . History of urinary retention    10-30-2014  POST OP  . Hypothyroidism   . Seasonal allergies   . Unspecified essential hypertension     Past Surgical History:  Procedure Laterality Date  . ARTHRODESIS METATARSALPHALANGEAL JOINT (MTPJ) Right 08/13/2020   Procedure: FUSION RIGHT GREAT TOE METATARSALPHALANGEAL JOINT (MTPJ);  Surgeon: Newt Minion, MD;  Location: [place];  Service: Orthopedics;  Laterality: Right;  . COLONOSCOPY  last one 02-25-2015  . HYDROCELE EXCISION Right 08/26/2015   Procedure: RIGHT HYDROCELECTOMY ADULT;  Surgeon: Franchot Gallo, MD;  Location: [facility];  Service: Urology;  Laterality: Right;  . INGUINAL HERNIA REPAIR Right 10/29/2014   Procedure: LAPAROSCOPIC REPAIR RECURRENT RIGHT INGUINAL ;  Surgeon: Fanny Skates, MD;  [facility];  Service: General;  Laterality: Right;  . INGUINAL HERNIA REPAIR Right 1984  . INSERTION OF MESH Right 10/29/2014   Procedure: INSERTION OF MESH;   Surgeon: Fanny Skates, MD;  [facility];  Service: General;  Laterality: Right;  . INSERTION OF MESH N/A 06/17/2020   Procedure: INSERTION OF MESH;  Surgeon: Coralie Keens, MD;  Location: [place];  Service: General;  Laterality: N/A;  . POLYPECTOMY    . TOE OSTEOTOMY Bilateral right 2002/  left 2013   great toe  . TOTAL HIP ARTHROPLASTY Left 09/22/2019   Procedure: LEFT TOTAL HIP ARTHROPLASTY ANTERIOR APPROACH;  Surgeon: Mcarthur Rossetti, MD;  [facility];  Service: Orthopedics;  Laterality: Left;  . UMBILICAL HERNIA REPAIR N/A 06/17/2020   Procedure: UMBILICAL HERNIA REPAIR;  Surgeon: Coralie Keens, MD;  Location: [place];  Service: General;  Laterality: N/A;    There were no vitals filed for this visit.   Subjective Assessment - 01/31/21 0842    Subjective doing well; feels he doesn't need PT anymore; sleeping on Rt side is 80% improved    Patient Stated Goals exercises, see how he's doing from a post-op stand point    Currently in Pain? No/denies              [facility] PT Assessment - 01/31/21 0855      Assessment   Medical Diagnosis M25.511,G89.29 (ICD-10-CM) - Chronic right shoulder pain    Referring Provider (PT) Garald Balding, MD    Onset Date/Surgical Date 11/14/20  Observation/Other Assessments   Focus on Therapeutic Outcomes (FOTO)  69      Strength   Right Shoulder Flexion --   47.7, 47.6, 44.7= 46.67# avg                        [facility] Adult PT Treatment/Exercise - 01/31/21 0844      Self-Care   Self-Care Other Self-Care Comments    Other Self-Care Comments  discussed HEP and how to progress, as well as other at home exercise to do (he has body blade)      Shoulder Exercises: ROM/Strengthening   UBE (Upper Arm Bike) L3 x 8 min (alt fwd/bwd every 2 min)                  PT Education - 01/31/21 0902    Education Details discussed continued exercise and how to progress     Person(s) Educated Patient    Methods Explanation;Demonstration;Handout    Comprehension Verbalized understanding;Returned demonstration            PT Short Term Goals - 12/26/20 1425      PT SHORT TERM GOAL #1   Title independent with initial HEP    Status Achieved    Target Date 01/09/21      PT SHORT TERM GOAL #2   Title Rt shoulder flex and abdct AROM improved to WNL for improved function    Status Achieved    Target Date 01/09/21             PT Long Term Goals - 01/31/21 0907      PT LONG TERM GOAL #1   Title independent with final HEP    Status Achieved      PT LONG TERM GOAL #2   Title FOTO score improved to at least 71 for improved function    Baseline 5/20: improved to 69    Status Not Met      PT LONG TERM GOAL #3   Title demonstrate at least 10# improvement in Rt shoulder strength for improved function and activity tolerance    Status Achieved      PT LONG TERM GOAL #4   Title report 75% improvement in sleep on Rt side for improved pain and function    Status Achieved                 Plan - 01/31/21 0907    Clinical Impression Statement Pt has met/nearly met all LTGs and is pleased with his progress.  He feels ready to d/c from PT today.  Will d/c PT.  FOTO score improved 16% but not quite to goal of 18% improvement.    Personal Factors and Comorbidities Comorbidity 3+    Comorbidities depression, hx basal cell cancer, HTN, multiple orthopedic surgeries    Examination-Activity Limitations Sleep;Carry;Lift;Reach Overhead    Examination-Participation Restrictions Community Activity;Yard Work    [employer] officer Evolving/Moderate complexity    Rehab Potential Good    PT Frequency 1x / week    PT Duration 8 weeks    PT Treatment/Interventions ADLs/Self Care Home Management;Cryotherapy;Electrical Stimulation;Iontophoresis 8m/ml Dexamethasone;Moist Heat;Therapeutic exercise;Therapeutic activities;Functional mobility  training;Ultrasound;Neuromuscular re-education;Patient/family education;Manual techniques;Taping;Dry needling;Passive range of motion    PT Next Visit Plan d/c PT todya    PT Home Exercise Plan Access Code: [email or web address]   Consulted and Agree with Plan of Care Patient           Patient will benefit from skilled therapeutic  intervention in order to improve the following deficits and impairments:  Decreased strength,Decreased range of motion,Postural dysfunction,Impaired UE functional use,Pain  Visit Diagnosis: Acute pain of right shoulder  Stiffness of right shoulder, not elsewhere classified  Abnormal posture  Muscle weakness (generalized)     Problem List Patient Active Problem List   Diagnosis Date Noted  . Hallux rigidus, right foot   . Pain from implanted hardware   . Hallux rigidus, left foot 07/30/2020  . Pain in right shoulder 07/30/2020  . Great toe pain, right 05/09/2020  . 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  . Unilateral primary osteoarthritis, left hip 09/22/2019  . Tendinitis, de Quervain's 06/02/2019  . Pain of left hip joint 02/07/2019  . Recurrent right inguinal hernia 10/29/2014  . Hypertension 01/01/2012  . Hypothyroidism 01/01/2012  . History of adenomatous polyp of colon 01/01/2012     Laureen Abrahams, PT, DPT 01/31/21 9:09 AM   [facility] Physical Therapy [address] [place], [place], [address] Phone: [phone]   Fax:  [phone]  Name: JAIDIN RICHISON MRN: [id number] Date of Birth: 1956/08/24     PHYSICAL THERAPY DISCHARGE SUMMARY  Visits from Start of Care: 6  Current functional level related to goals / functional outcomes: See above   Remaining deficits: See above   Education / Equipment: HEP  Plan: Patient agrees to discharge.  Patient goals were partially met. Patient is being discharged due to being pleased with the current functional level.  ?????    Laureen Abrahams, PT,  DPT 01/31/21 9:09 AM  [facility] Physical Therapy [address] [place], [place], [address] Phone: [phone]   Fax:  [phone]

## 2021-02-17 ENCOUNTER — Ambulatory Visit: Payer: BC Managed Care – PPO | Admitting: Specialist

## 2021-02-19 ENCOUNTER — Encounter: Payer: Self-pay | Admitting: Orthopaedic Surgery

## 2021-02-19 ENCOUNTER — Ambulatory Visit (INDEPENDENT_AMBULATORY_CARE_PROVIDER_SITE_OTHER): Payer: BC Managed Care – PPO | Admitting: Orthopaedic Surgery

## 2021-02-19 ENCOUNTER — Other Ambulatory Visit: Payer: Self-pay

## 2021-02-19 DIAGNOSIS — M25511 Pain in right shoulder: Secondary | ICD-10-CM | POA: Diagnosis not present

## 2021-02-19 DIAGNOSIS — G8929 Other chronic pain: Secondary | ICD-10-CM

## 2021-02-19 NOTE — Progress Notes (Signed)
Office Visit Note   Patient: Robert Blair           Date of Birth: 24-Jul-1956           MRN: [id number] Visit Date: 02/19/2021              Requested by: [name], [address], [place],  [place] [address] PCP: [name], MD   Assessment & Plan: Visit Diagnoses:  1. Chronic right shoulder pain     Plan: Mr. Esquivel is just over 3 months status post right shoulder surgery that involved an arthroscopic SCD, DCR and biceps tendon release.  He is very happy with the results and does not have the pain that he had preoperatively.  He would   like to get back in a normal routine and I suggested he do that progressively.  He like to start playing golf and even try overhand serving tennis.  He was easily able to raise his right arm over his head without any problem.  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  He does not have any localized areas of tenderness.  Suggested he continue with his exercises and return to see me as needed  Follow-Up Instructions: Return if symptoms worsen or fail to improve.   Orders:  No orders of the defined types were placed in this encounter.  No orders of the defined types were placed in this encounter.     Procedures: No procedures performed   Clinical Data: No additional findings.   Subjective: Chief Complaint  Patient presents with  . Right Shoulder - Routine Post Op  Just over 3 months status post right shoulder arthroscopic SCD, DCR and biceps tendon release.  Doing quite well without any the preoperative pain  HPI  Review of Systems   Objective: Vital Signs: There were no vitals taken for this visit.  Physical Exam Constitutional:      Appearance: He is well-developed.  Eyes:     Pupils: Pupils are equal, round, and reactive to light.  Pulmonary:     Effort: Pulmonary effort is normal.  Skin:    General: Skin is warm and dry.  Neurological:      Mental Status: He is alert and oriented to person, place, and time.  Psychiatric:        Behavior: Behavior normal.     Ortho Exam awake alert and oriented x3.  Comfortable sitting.  Able to place right arm fully overhead and quickly.  Negative impingement.  No localized areas of tenderness about the anterior subacromial region.  A little bit of soreness at the [facility] joint.  Typical Popeye deformity status post biceps tendon release.  Grip and good release.  Good strength  Specialty Comments:  No specialty comments available.  Imaging: No results found.   PMFS History: Patient Active Problem List   Diagnosis Date Noted  . Hallux rigidus, right foot   . Pain from implanted hardware   . Hallux rigidus, left foot 07/30/2020  . Pain in right shoulder 07/30/2020  . Great toe pain, right 05/09/2020  . 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  . Unilateral primary osteoarthritis, left hip 09/22/2019  . Tendinitis, de Quervain's 06/02/2019  . Pain of left hip joint 02/07/2019  . Recurrent right inguinal hernia 10/29/2014  . Hypertension 01/01/2012  . Hypothyroidism 01/01/2012  . History of adenomatous polyp of colon 01/01/2012   Past Medical History:  Diagnosis Date  . Arthritis   . Cancer ([place]) 2016   Basal cell  . Complication of anesthesia    URINARY RETENTION  . Depression   . History of adenomatous polyp of colon    2008/  2013/  2016  . History of basal cell carcinoma excision    2015  nose  . History of urinary retention    10-30-2014  POST OP  . Hypothyroidism   . Seasonal allergies   . Unspecified essential hypertension     Family History  Problem Relation Age of Onset  . Breast cancer Mother   . Hypertension Mother   . Depression Mother   . Cancer - Other Mother        unknown GI cancer  . Colon polyps Father   . Stroke Father   . Alzheimer's disease Father   . Colon cancer Neg Hx   . Rectal cancer Neg Hx   . Stomach cancer Neg Hx   . Liver disease Neg  Hx     Past Surgical History:  Procedure Laterality Date  . ARTHRODESIS METATARSALPHALANGEAL JOINT (MTPJ) Right 08/13/2020   Procedure: FUSION RIGHT GREAT TOE METATARSALPHALANGEAL JOINT (MTPJ);  Surgeon: Newt Minion, MD;  Location: [place];  Service: Orthopedics;  Laterality: Right;  . COLONOSCOPY  last one 02-25-2015  . HYDROCELE EXCISION Right 08/26/2015   Procedure: RIGHT HYDROCELECTOMY ADULT;  Surgeon: Franchot Gallo, MD;  Location[facility];  Service: Urology;  Laterality: Right;  . INGUINAL HERNIA REPAIR Right 10/29/2014   Procedure: LAPAROSCOPIC REPAIR RECURRENT RIGHT INGUINAL ;  Surgeon: Fanny Skates, MD;  [facility];  Service: General;  Laterality: Right;  . INGUINAL HERNIA REPAIR Right 1984  . INSERTION OF MESH Right 10/29/2014   Procedure: INSERTION OF MESH;  Surgeon: Fanny Skates, MD;  [facility];  Service: General;  Laterality: Right;  . INSERTION OF MESH N/A 06/17/2020   Procedure: INSERTION OF MESH;  Surgeon: Coralie Keens, MD;  Location: [place];  Service: General;  Laterality: N/A;  . POLYPECTOMY    . TOE OSTEOTOMY Bilateral right 2002/  left 2013   great toe  . TOTAL HIP ARTHROPLASTY Left 09/22/2019   Procedure: LEFT TOTAL HIP ARTHROPLASTY ANTERIOR APPROACH;  Surgeon: Mcarthur Rossetti, MD;  [facility];  Service: Orthopedics;  Laterality: Left;  . UMBILICAL HERNIA REPAIR N/A 06/17/2020   Procedure: UMBILICAL HERNIA REPAIR;  Surgeon: Coralie Keens, MD;  Location: [place];  Service: General;  Laterality: N/A;   Social History   Occupational History  . Occupation: self employed  Tobacco Use  . Smoking status: Never Smoker  . Smokeless tobacco: Never Used  Vaping Use  . Vaping Use: Never used  Substance and Sexual Activity  . Alcohol use: Yes    Comment: social  . Drug use: No  . Sexual activity: Yes     Garald Balding, MD   Note - This record has  been created using Editor, commissioning.  Chart creation errors have been sought, but may not always  have been located. Such creation errors do not reflect on  the standard of medical care.

## 2021-06-09 ENCOUNTER — Encounter: Payer: Self-pay | Admitting: Orthopaedic Surgery

## 2021-06-09 ENCOUNTER — Ambulatory Visit: Payer: Self-pay

## 2021-06-09 ENCOUNTER — Other Ambulatory Visit: Payer: Self-pay

## 2021-06-09 ENCOUNTER — Ambulatory Visit (INDEPENDENT_AMBULATORY_CARE_PROVIDER_SITE_OTHER): Payer: BC Managed Care – PPO | Admitting: Orthopaedic Surgery

## 2021-06-09 DIAGNOSIS — M2170 Unequal limb length (acquired), unspecified site: Secondary | ICD-10-CM

## 2021-06-09 DIAGNOSIS — Z96642 Presence of left artificial hip joint: Secondary | ICD-10-CM | POA: Diagnosis not present

## 2021-06-09 DIAGNOSIS — M25551 Pain in right hip: Secondary | ICD-10-CM | POA: Diagnosis not present

## 2021-06-09 NOTE — Progress Notes (Signed)
The patient is well-known to me.  He is a 65 year old gentleman that I replaced his left hip remotely about a year or so ago.  He does have bilateral flatfoot deformities and said he definitely has a leg length discrepancy with the left side shorter than the right.  This is affecting his balance and his gait in general.  When I have him lay supine there is a leg length discrepancy with his left side shorter than the right.  He also has bilateral flatfoot deformities.  An AP pelvis is obtained with him standing.  There is no leg length discrepancy on the standing films but certainly clinically it is more important and does show this.  He does have bilateral flatfoot deformities.  His right hip appears normal with no arthritis.  The left hip on x-ray has a well-seated implant.  At this point I would like to send him to a prosthetic specialist for bilateral custom inserts due to his flat feet deformity but the left side needs to certainly be secondary to address his leg length discrepancy.  He agrees with this treatment plan.  All questions and concerns were answered and addressed.  Follow-up can be as needed.

## 2021-06-16 ENCOUNTER — Other Ambulatory Visit: Payer: Self-pay

## 2021-06-16 ENCOUNTER — Ambulatory Visit: Payer: Self-pay

## 2021-06-16 ENCOUNTER — Encounter: Payer: Self-pay | Admitting: Orthopedic Surgery

## 2021-06-16 ENCOUNTER — Ambulatory Visit (INDEPENDENT_AMBULATORY_CARE_PROVIDER_SITE_OTHER): Payer: BC Managed Care – PPO | Admitting: Orthopedic Surgery

## 2021-06-16 VITALS — BP 166/107 | HR 99 | Ht 67.0 in | Wt 220.0 lb

## 2021-06-16 DIAGNOSIS — M79641 Pain in right hand: Secondary | ICD-10-CM | POA: Diagnosis not present

## 2021-06-16 DIAGNOSIS — M19042 Primary osteoarthritis, left hand: Secondary | ICD-10-CM | POA: Insufficient documentation

## 2021-06-16 DIAGNOSIS — M19041 Primary osteoarthritis, right hand: Secondary | ICD-10-CM

## 2021-06-16 DIAGNOSIS — M79642 Pain in left hand: Secondary | ICD-10-CM | POA: Diagnosis not present

## 2021-06-16 DIAGNOSIS — R2231 Localized swelling, mass and lump, right upper limb: Secondary | ICD-10-CM | POA: Diagnosis not present

## 2021-06-16 NOTE — Progress Notes (Signed)
Office Visit Note   Patient: Robert Blair           Date of Birth: 20-Jun-1956           MRN: [id number] Visit Date: 06/16/2021              Requested by: [name], [address], [place],  [place] [address] PCP: [name], MD   Assessment & Plan: Visit Diagnoses:  1. Pain in both hands   2. 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   4. 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   5. Mass of finger of right hand     Plan: Discussed the nature and prognosis for bilateral MCPJ arthritis.  We discussed nonoperative treatment options including oral or topical NSAIDS, corticosteroid injection, and bracing.  We also discussed surgical options including arthrodesis or arthroplasty.  At this point, his joints don't bother him enough to warrant injections or surgery.  He wants to try oral or topical NSAID use.  The mass on his right middle finger has been stable in size and character since it was incidentally noticed.  It is not terrible bothersome to him but he is interested in advanced imaging to get a better idea of the diagnosis.  He will see me back after his MRI is completed.  Follow-Up Instructions: No follow-ups on file.   Orders:  Orders Placed This Encounter  Procedures   XR Hand Complete Right   XR Hand Complete Left   MR FINGERS RIGHT W WO CONTRAST   No orders of the defined types were placed in this encounter.     Procedures: No procedures performed   Clinical Data: No additional findings.   Subjective: Chief Complaint  Patient presents with   Right Hand - Pain   Left Hand - Pain    This is a 65 yo RHD M who presents w/ multiple complaints involving bilateral hands.  He describes dull, aching pain w/ associated loss of motion at his right index and middle and left index  finger MCP joints.  This has been going on for years.  He denies any history of trauma.  He is an avid golfer and is not having much difficulty with his golf grip.  Some days are more symptomatic than others.  He also has a soft tissue mass at the ulnar aspect of the right middle finger over the middle phalanx.  This has been stable in size and character since first noticed over a year ago.  It is not painful unless he puts significant pressure on the mass.     Review of Systems  Constitutional: Negative.   Respiratory: Negative.    Cardiovascular: Negative.   Skin: Negative.     Objective: Vital Signs: BP (!) 166/107 (BP Location: Left Arm, Patient Position: Sitting)   Pulse 99   Ht 5\' 7"  (1.702 m)   Wt 220 lb (99.8 kg)   BMI 34.46 kg/m   Physical Exam Cardiovascular:     Rate and Rhythm: Normal rate.     Pulses: Normal pulses.  Pulmonary:     Effort: Pulmonary effort is normal.  Skin:    General: Skin is warm and dry.     Capillary Refill: Capillary refill takes less than 2 seconds.  Neurological:     Mental Status: He is alert.  Right Hand Exam   Range of Motion  The patient has normal right wrist ROM.   Muscle Strength  Wrist extension: 5/5  Wrist flexion: 5/5  Grip: 5/5   Other  Erythema: absent Sensation: normal Pulse: present  Comments:  Moderate swelling at index and middle finger MCPJs.  Palpable crepitus with limited ROM at middle finger MCPJ.  Limited ROM at index MCPJ secondary to stiffness.  No pain in remainder of hand.   Approx 2x2 cm mass at volar ulnar aspect of middle finger at level of middle phalanx.  Mass appears well circumscribed.    Left Hand Exam   Range of Motion  The patient has normal left wrist ROM.  Muscle Strength  Wrist extension: 5/5  Wrist flexion: 5/5  Grip:  5/5   Other  Sensation: normal Pulse: present  Comments:  Mild swelling and limited ROM at index MCPJ.      Specialty Comments:  No specialty comments  available.  Imaging: Multiple views of bilateral hands taken today are reviewed and interpreted by me.  They demonstrate degenerative changes involving the right index and middle and left index fingers at the MCPJ.  There is joint space narrowing with associated osteophytes.  The right middle fingers is the worst radiographically.    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 06/16/2021   Mass of finger of right hand 06/16/2021   Hallux rigidus, right foot    Pain from implanted hardware    Hallux rigidus, left foot 07/30/2020   Pain in right shoulder 07/30/2020   Great toe pain, right 05/09/2020   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   Unilateral primary osteoarthritis, left hip 09/22/2019   Tendinitis, de Quervain's 06/02/2019   Pain of left hip joint 02/07/2019   Recurrent right inguinal hernia 10/29/2014   Hypertension 01/01/2012   Hypothyroidism 01/01/2012   History of adenomatous polyp of colon 01/01/2012   Past Medical History:  Diagnosis Date   Arthritis    Cancer ([place]) 2016   Basal cell   Complication of anesthesia    URINARY RETENTION   Depression    History of adenomatous polyp of colon    2008/  2013/  2016   History of basal cell carcinoma excision    2015  nose   History of urinary retention    10-30-2014  POST OP   Hypothyroidism    Seasonal allergies    Unspecified essential hypertension     Family History  Problem Relation Age of Onset   Breast cancer Mother    Hypertension Mother    Depression Mother    Cancer - Other Mother        unknown GI cancer   Colon polyps Father    Stroke Father    Alzheimer's disease Father    Colon cancer Neg Hx    Rectal  cancer Neg Hx    Stomach cancer Neg Hx    Liver disease Neg Hx     Past Surgical History:  Procedure Laterality Date   ARTHRODESIS METATARSALPHALANGEAL JOINT (MTPJ) Right 08/13/2020   Procedure: FUSION RIGHT GREAT TOE METATARSALPHALANGEAL JOINT (MTPJ);  Surgeon: Newt Minion, MD;  Location: [facility];  Service: Orthopedics;  Laterality: Right;  COLONOSCOPY  last one 02-25-2015   HYDROCELE EXCISION Right 08/26/2015   Procedure: RIGHT HYDROCELECTOMY ADULT;  Surgeon: Franchot Gallo, MD;  Location: [facility];  Service: Urology;  Laterality: Right;   INGUINAL HERNIA REPAIR Right 10/29/2014   Procedure: LAPAROSCOPIC REPAIR RECURRENT RIGHT INGUINAL ;  Surgeon: Fanny Skates, MD;  Location: [facility];  Service: General;  Laterality: Right;   INGUINAL HERNIA REPAIR Right 1984   INSERTION OF MESH Right 10/29/2014   Procedure: INSERTION OF MESH;  Surgeon: Fanny Skates, MD;  Location: [facility];  Service: General;  Laterality: Right;   INSERTION OF MESH N/A 06/17/2020   Procedure: INSERTION OF MESH;  Surgeon: Coralie Keens, MD;  Location: [facility];  Service: General;  Laterality: N/A;   POLYPECTOMY     TOE OSTEOTOMY Bilateral right 2002/  left 2013   great toe   TOTAL HIP ARTHROPLASTY Left 09/22/2019   Procedure: LEFT TOTAL HIP ARTHROPLASTY ANTERIOR APPROACH;  Surgeon: Mcarthur Rossetti, MD;  Location: [facility];  Service: Orthopedics;  Laterality: Left;   UMBILICAL HERNIA REPAIR N/A 06/17/2020   Procedure: UMBILICAL HERNIA REPAIR;  Surgeon: Coralie Keens, MD;  Location: [facility];  Service: General;  Laterality: N/A;   Social History   Occupational History   Occupation: self employed  Tobacco Use   Smoking status: Never   Smokeless tobacco: Never  Vaping Use   Vaping Use: Never used  Substance and Sexual Activity   Alcohol use: Yes    Comment: social   Drug use: No   Sexual activity: Yes

## 2021-06-25 ENCOUNTER — Encounter: Payer: Self-pay | Admitting: Orthopaedic Surgery

## 2021-06-25 ENCOUNTER — Other Ambulatory Visit: Payer: Self-pay | Admitting: Orthopaedic Surgery

## 2021-06-25 MED ORDER — TIZANIDINE HCL 4 MG PO TABS: 4.0000 mg | ORAL_TABLET | Freq: Three times a day (TID) | ORAL | 1 refills | Status: DC | PRN

## 2021-07-08 ENCOUNTER — Ambulatory Visit
Admission: RE | Admit: 2021-07-08 | Discharge: 2021-07-08 | Disposition: A | Discharge status: Home / Self Care | Payer: BC Managed Care – PPO | Source: Ambulatory Visit | Attending: Orthopedic Surgery | Admitting: Orthopedic Surgery

## 2021-07-08 ENCOUNTER — Other Ambulatory Visit: Payer: Self-pay

## 2021-07-08 DIAGNOSIS — R2231 Localized swelling, mass and lump, right upper limb: Secondary | ICD-10-CM

## 2021-07-08 MED ORDER — GADOBENATE DIMEGLUMINE 529 MG/ML IV SOLN
20.0000 mL | Freq: Once | INTRAVENOUS | Status: AC | PRN
  Administered 2021-07-08: 20 mL via INTRAVENOUS

## 2021-07-11 ENCOUNTER — Other Ambulatory Visit: Payer: Self-pay

## 2021-07-11 ENCOUNTER — Encounter: Payer: Self-pay | Admitting: Orthopedic Surgery

## 2021-07-11 ENCOUNTER — Ambulatory Visit (INDEPENDENT_AMBULATORY_CARE_PROVIDER_SITE_OTHER): Payer: BC Managed Care – PPO | Admitting: Orthopedic Surgery

## 2021-07-11 VITALS — BP 136/75 | HR 85 | Ht 67.0 in | Wt 220.0 lb

## 2021-07-11 DIAGNOSIS — R2231 Localized swelling, mass and lump, right upper limb: Secondary | ICD-10-CM

## 2021-07-11 NOTE — Progress Notes (Signed)
Office Visit Note   Patient: Robert Blair           Date of Birth: [birth year]           MRN: [id number] Visit Date: 07/11/2021              Requested by: [name], [address], [place],  [place] [address] PCP: [name], MD   Assessment & Plan: Visit Diagnoses:  1. Mass of finger of right hand     Plan: We discussed the diagnosis, prognosis, and both conservative and operative treatment options for his right middle finger mass.  After our discussion, the patient has elected to proceed with surgical excision.  We reviewed the benefits of surgery and the potential risks including, but not limited to, persistent symptoms, infection, damage to nearby nerves and blood vessels, delayed wound healing.  The mass appears to abut the ulnar neurovascular bundle so we specifically discussed damage to the ulnar digital nerve.    All patient concerns and questions were addressed.  A surgical date will be confirmed with the patient.    Follow-Up Instructions: No follow-ups on file.   Orders:  No orders of the defined types were placed in this encounter.  No orders of the defined types were placed in this encounter.     Procedures: No procedures performed   Clinical Data: No additional findings.   Subjective: Chief Complaint  Patient presents with   Right Hand - Follow-up    This is a 65 yo RHD M who presents for follow up of a right middle finger mass.  He was last seen approximately a month ago.  An MRI was obtained which showed a mass adjacent to the ulnar aspect of the flexor tendons.  The mass is painful when pressure is applied.  He is unsure if it has enlarged over time.  He is interested in having the mass excised.    Review of Systems   Objective: Vital Signs: BP 136/75 (BP Location: Left Arm, Patient Position: Sitting, Cuff Size: Normal)   Pulse 85   Ht 5\' 7"  (1.702 m)   Wt 220 lb (99.8 kg)   SpO2 97%   BMI 34.46 kg/m   Physical  Exam Constitutional:      Appearance: Normal appearance.  Cardiovascular:     Rate and Rhythm: Normal rate.     Pulses: Normal pulses.  Pulmonary:     Effort: Pulmonary effort is normal.  Skin:    General: Skin is warm and dry.     Capillary Refill: Capillary refill takes less than 2 seconds.  Neurological:     Mental Status: He is alert.    Right Hand Exam   Tenderness  Right hand tenderness location: TTP at mass over ulnar aspect of middle finger around middle phalanx.  Range of Motion  The patient has normal right wrist ROM.   Other  Erythema: absent Sensation: normal Pulse: present  Comments:  Firm, well circumscribed mass at ulnar aspect of middle finger over middle phalanx.  No numbness or tingling in this finger.      Specialty Comments:  No specialty comments available.  Imaging: MRI of the R hand reviewed and interprted by me.  It demonstrates a well circumscribed mass at the ulnar aspect of the middle finger over the middle phalanx.  The mass sites between the flexor tendon and ulnar NV bundle.    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   Unilateral primary osteoarthritis, left hip 09/22/2019   Tendinitis, de Quervain's 06/02/2019   Pain of left hip joint 02/07/2019   Recurrent right inguinal hernia 10/29/2014   Hypertension 01/01/2012   Hypothyroidism 01/01/2012   History of adenomatous  polyp of colon 01/01/2012   Past Medical History:  Diagnosis Date   Arthritis    Cancer ([place]) 2016   Basal cell   Complication of anesthesia    URINARY RETENTION   Depression    History of adenomatous polyp of colon    2008/  2013/  2016   History of basal cell carcinoma excision    2015  nose   History of urinary retention    10-30-2014  POST OP   Hypothyroidism    Seasonal allergies    Unspecified essential hypertension     Family History  Problem Relation Age of Onset   Breast cancer Mother    Hypertension Mother    Depression Mother    Cancer - Other Mother        unknown GI cancer   Colon polyps Father    Stroke Father    Alzheimer's disease Father    Colon cancer Neg Hx    Rectal cancer Neg Hx    Stomach cancer Neg Hx    Liver disease Neg Hx     Past Surgical History:  Procedure Laterality Date   ARTHRODESIS METATARSALPHALANGEAL JOINT (MTPJ) Right 08/13/2020   Procedure: FUSION RIGHT GREAT TOE METATARSALPHALANGEAL JOINT (MTPJ);  Surgeon: Newt Minion, MD;  Location: [facility];  Service: Orthopedics;  Laterality: Right;   COLONOSCOPY  last one 02-25-2015   HYDROCELE EXCISION Right 08/26/2015   Procedure: RIGHT HYDROCELECTOMY ADULT;  Surgeon: Franchot Gallo, MD;  Location: [facility];  Service: Urology;  Laterality: Right;   INGUINAL HERNIA REPAIR Right 10/29/2014   Procedure: LAPAROSCOPIC REPAIR RECURRENT RIGHT INGUINAL ;  Surgeon: Fanny Skates, MD;  Location[facility];  Service: General;  Laterality: Right;   INGUINAL HERNIA REPAIR Right 1984   INSERTION OF MESH Right 10/29/2014   Procedure: INSERTION OF MESH;  Surgeon: Fanny Skates, MD;  Location[facility];  Service: General;  Laterality: Right;   INSERTION OF MESH N/A 06/17/2020   Procedure: INSERTION OF MESH;  Surgeon: Coralie Keens, MD;  Location: [facility];  Service: General;  Laterality: N/A;   POLYPECTOMY     TOE OSTEOTOMY Bilateral right 2002/  left  2013   great toe   TOTAL HIP ARTHROPLASTY Left 09/22/2019   Procedure: LEFT TOTAL HIP ARTHROPLASTY ANTERIOR APPROACH;  Surgeon: Mcarthur Rossetti, MD;  Location[facility];  Service: Orthopedics;  Laterality: Left;   UMBILICAL HERNIA REPAIR N/A 06/17/2020   Procedure: UMBILICAL HERNIA REPAIR;  Surgeon: Coralie Keens, MD;  Location: [facility];  Service: General;  Laterality: N/A;   Social History   Occupational History   Occupation: self employed  Tobacco Use   Smoking status: Never  Smokeless tobacco: Never  Vaping Use   Vaping Use: Never used  Substance and Sexual Activity   Alcohol use: Yes    Comment: social   Drug use: No   Sexual activity: Yes

## 2021-07-11 NOTE — H&P (View-Only) (Signed)
Office Visit Note   Patient: Robert Blair           Date of Birth: 06-20-1956           MRN: [id number] Visit Date: 07/11/2021              Requested by: [name], [address], [place],  [place] [address] PCP: [name], MD   Assessment & Plan: Visit Diagnoses:  1. Mass of finger of right hand     Plan: We discussed the diagnosis, prognosis, and both conservative and operative treatment options for his right middle finger mass.  After our discussion, the patient has elected to proceed with surgical excision.  We reviewed the benefits of surgery and the potential risks including, but not limited to, persistent symptoms, infection, damage to nearby nerves and blood vessels, delayed wound healing.  The mass appears to abut the ulnar neurovascular bundle so we specifically discussed damage to the ulnar digital nerve.    All patient concerns and questions were addressed.  A surgical date will be confirmed with the patient.    Follow-Up Instructions: No follow-ups on file.   Orders:  No orders of the defined types were placed in this encounter.  No orders of the defined types were placed in this encounter.     Procedures: No procedures performed   Clinical Data: No additional findings.   Subjective: Chief Complaint  Patient presents with   Right Hand - Follow-up    This is a 65 yo RHD M who presents for follow up of a right middle finger mass.  He was last seen approximately a month ago.  An MRI was obtained which showed a mass adjacent to the ulnar aspect of the flexor tendons.  The mass is painful when pressure is applied.  He is unsure if it has enlarged over time.  He is interested in having the mass excised.    Review of Systems   Objective: Vital Signs: BP 136/75 (BP Location: Left Arm, Patient Position: Sitting, Cuff Size: Normal)   Pulse 85   Ht 5\' 7"  (1.702 m)   Wt 220 lb (99.8 kg)   SpO2 97%   BMI 34.46 kg/m   Physical  Exam Constitutional:      Appearance: Normal appearance.  Cardiovascular:     Rate and Rhythm: Normal rate.     Pulses: Normal pulses.  Pulmonary:     Effort: Pulmonary effort is normal.  Skin:    General: Skin is warm and dry.     Capillary Refill: Capillary refill takes less than 2 seconds.  Neurological:     Mental Status: He is alert.    Right Hand Exam   Tenderness  Right hand tenderness location: TTP at mass over ulnar aspect of middle finger around middle phalanx.  Range of Motion  The patient has normal right wrist ROM.   Other  Erythema: absent Sensation: normal Pulse: present  Comments:  Firm, well circumscribed mass at ulnar aspect of middle finger over middle phalanx.  No numbness or tingling in this finger.      Specialty Comments:  No specialty comments available.  Imaging: MRI of the R hand reviewed and interprted by me.  It demonstrates a well circumscribed mass at the ulnar aspect of the middle finger over the middle phalanx.  The mass sites between the flexor tendon and ulnar NV bundle.    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   Unilateral primary osteoarthritis, left hip 09/22/2019   Tendinitis, de Quervain's 06/02/2019   Pain of left hip joint 02/07/2019   Recurrent right inguinal hernia 10/29/2014   Hypertension 01/01/2012   Hypothyroidism 01/01/2012   History of adenomatous  polyp of colon 01/01/2012   Past Medical History:  Diagnosis Date   Arthritis    Cancer ([place]) 2016   Basal cell   Complication of anesthesia    URINARY RETENTION   Depression    History of adenomatous polyp of colon    2008/  2013/  2016   History of basal cell carcinoma excision    2015  nose   History of urinary retention    10-30-2014  POST OP   Hypothyroidism    Seasonal allergies    Unspecified essential hypertension     Family History  Problem Relation Age of Onset   Breast cancer Mother    Hypertension Mother    Depression Mother    Cancer - Other Mother        unknown GI cancer   Colon polyps Father    Stroke Father    Alzheimer's disease Father    Colon cancer Neg Hx    Rectal cancer Neg Hx    Stomach cancer Neg Hx    Liver disease Neg Hx     Past Surgical History:  Procedure Laterality Date   ARTHRODESIS METATARSALPHALANGEAL JOINT (MTPJ) Right 08/13/2020   Procedure: FUSION RIGHT GREAT TOE METATARSALPHALANGEAL JOINT (MTPJ);  Surgeon: Newt Minion, MD;  Location: [facility];  Service: Orthopedics;  Laterality: Right;   COLONOSCOPY  last one 02-25-2015   HYDROCELE EXCISION Right 08/26/2015   Procedure: RIGHT HYDROCELECTOMY ADULT;  Surgeon: Franchot Gallo, MD;  Location: [facility];  Service: Urology;  Laterality: Right;   INGUINAL HERNIA REPAIR Right 10/29/2014   Procedure: LAPAROSCOPIC REPAIR RECURRENT RIGHT INGUINAL ;  Surgeon: Fanny Skates, MD;  [facility];  Service: General;  Laterality: Right;   INGUINAL HERNIA REPAIR Right 1984   INSERTION OF MESH Right 10/29/2014   Procedure: INSERTION OF MESH;  Surgeon: Fanny Skates, MD;  [facility];  Service: General;  Laterality: Right;   INSERTION OF MESH N/A 06/17/2020   Procedure: INSERTION OF MESH;  Surgeon: Coralie Keens, MD;  Location: [facility];  Service: General;  Laterality: N/A;   POLYPECTOMY     TOE OSTEOTOMY Bilateral right 2002/  left  2013   great toe   TOTAL HIP ARTHROPLASTY Left 09/22/2019   Procedure: LEFT TOTAL HIP ARTHROPLASTY ANTERIOR APPROACH;  Surgeon: Mcarthur Rossetti, MD;  Location[facility];  Service: Orthopedics;  Laterality: Left;   UMBILICAL HERNIA REPAIR N/A 06/17/2020   Procedure: UMBILICAL HERNIA REPAIR;  Surgeon: Coralie Keens, MD;  Location: [facility];  Service: General;  Laterality: N/A;   Social History   Occupational History   Occupation: self employed  Tobacco Use   Smoking status: Never  Smokeless tobacco: Never  Vaping Use   Vaping Use: Never used  Substance and Sexual Activity   Alcohol use: Yes    Comment: social   Drug use: No   Sexual activity: Yes

## 2021-07-21 ENCOUNTER — Other Ambulatory Visit: Payer: Self-pay

## 2021-07-21 ENCOUNTER — Encounter: Payer: Self-pay | Admitting: Physician Assistant

## 2021-07-21 ENCOUNTER — Ambulatory Visit (INDEPENDENT_AMBULATORY_CARE_PROVIDER_SITE_OTHER): Payer: BC Managed Care – PPO | Admitting: Physician Assistant

## 2021-07-21 DIAGNOSIS — M25551 Pain in right hip: Secondary | ICD-10-CM | POA: Diagnosis not present

## 2021-07-21 NOTE — Progress Notes (Signed)
HPI: Robert Blair returns today due to right hip and buttocks pain.  He was seen by Dr. Ninfa Blair on 06/09/2021 and at that time radiographs were obtained of his right hip and showed no acute fractures no significant arthropathy.  He states that he is only having pain whenever he is getting up from a sitting position.  He is able to do all other activities.  He notes that the pain really began in his right hip after doing some Pilates and he was doing an exercise where he was inverting his foot and circumflex in his hip.  After that he had pain down the right leg that went into his lateral tib-fib area.  He feels he is getting better.  He denies any groin pain denies any numbness tingling.  He is having no back pain at this point.  Review of systems: Please see HPI otherwise negative Physical exam: General well-developed well-nourished male no acute distress mood and affect appropriate. Right hip excellent range of motion without pain.  Straight leg raise negative bilaterally.  Positive tight hamstrings bilaterally.  Full flexion lumbar spine and extension lumbar spine without pain.  He is nontender over the right hip trochanteric region of the right lower lumbar paraspinous region.  Impression: Right hip pain  Plan: We will send him for physical therapy for his back they will work on core strengthening, stretching including his hamstrings IT band.  He will follow-up with [place] as needed pain persist or becomes worse.  Questions encouraged and answered at length.

## 2021-07-21 NOTE — Addendum Note (Signed)
Addended by: Robyne Peers on: 07/21/2021 04:26 PM   Modules accepted: Orders

## 2021-07-24 ENCOUNTER — Other Ambulatory Visit: Payer: Self-pay

## 2021-07-24 ENCOUNTER — Ambulatory Visit (INDEPENDENT_AMBULATORY_CARE_PROVIDER_SITE_OTHER): Payer: BC Managed Care – PPO | Admitting: Rehabilitative and Restorative Service Providers"

## 2021-07-24 ENCOUNTER — Encounter: Payer: Self-pay | Admitting: Rehabilitative and Restorative Service Providers"

## 2021-07-24 DIAGNOSIS — R293 Abnormal posture: Secondary | ICD-10-CM | POA: Diagnosis not present

## 2021-07-24 DIAGNOSIS — M6281 Muscle weakness (generalized): Secondary | ICD-10-CM | POA: Diagnosis not present

## 2021-07-24 DIAGNOSIS — M5441 Lumbago with sciatica, right side: Secondary | ICD-10-CM | POA: Diagnosis not present

## 2021-07-24 DIAGNOSIS — R262 Difficulty in walking, not elsewhere classified: Secondary | ICD-10-CM

## 2021-07-24 DIAGNOSIS — G8929 Other chronic pain: Secondary | ICD-10-CM

## 2021-07-24 NOTE — Therapy (Signed)
[facility] [address] [place], [place], [address] Phone: [phone]   Fax:  [phone]  Physical Therapy Evaluation  Patient Details  Name: Robert Blair MRN: [id number] Date of Birth: Aug 21, 1956 Referring Provider (PT): Pete Pelt PA-C   Encounter Date: 07/24/2021   PT End of Session - 07/24/21 1157     Visit Number 1    Number of Visits 12    Date for PT Re-Evaluation 09/18/21    Progress Note Due on Visit 10    PT Start Time 0930    PT Stop Time 5102    PT Time Calculation (min) 45 min    Activity Tolerance Patient tolerated treatment well;No increased pain    Behavior During Therapy [facility] for tasks assessed/performed             Past Medical History:  Diagnosis Date   Arthritis    Cancer ([place]) 2016   Basal cell   Complication of anesthesia    URINARY RETENTION   Depression    History of adenomatous polyp of colon    2008/  2013/  2016   History of basal cell carcinoma excision    2015  nose   History of urinary retention    10-30-2014  POST OP   Hypothyroidism    Seasonal allergies    Unspecified essential hypertension     Past Surgical History:  Procedure Laterality Date   ARTHRODESIS METATARSALPHALANGEAL JOINT (MTPJ) Right 08/13/2020   Procedure: FUSION RIGHT GREAT TOE METATARSALPHALANGEAL JOINT (MTPJ);  Surgeon: Newt Minion, MD;  Location: [place];  Service: Orthopedics;  Laterality: Right;   COLONOSCOPY  last one 02-25-2015   HYDROCELE EXCISION Right 08/26/2015   Procedure: RIGHT HYDROCELECTOMY ADULT;  Surgeon: Franchot Gallo, MD;  Location: [facility];  Service: Urology;  Laterality: Right;   INGUINAL HERNIA REPAIR Right 10/29/2014   Procedure: LAPAROSCOPIC REPAIR RECURRENT RIGHT INGUINAL ;  Surgeon: Fanny Skates, MD;  Location: [facility];  Service: General;  Laterality: Right;   INGUINAL HERNIA REPAIR Right 1984   INSERTION OF MESH Right 10/29/2014   Procedure:  INSERTION OF MESH;  Surgeon: Fanny Skates, MD;  Location: [facility];  Service: General;  Laterality: Right;   INSERTION OF MESH N/A 06/17/2020   Procedure: INSERTION OF MESH;  Surgeon: Coralie Keens, MD;  Location: [place];  Service: General;  Laterality: N/A;   POLYPECTOMY     TOE OSTEOTOMY Bilateral right 2002/  left 2013   great toe   TOTAL HIP ARTHROPLASTY Left 09/22/2019   Procedure: LEFT TOTAL HIP ARTHROPLASTY ANTERIOR APPROACH;  Surgeon: Mcarthur Rossetti, MD;  Location[facility];  Service: Orthopedics;  Laterality: Left;   UMBILICAL HERNIA REPAIR N/A 06/17/2020   Procedure: UMBILICAL HERNIA REPAIR;  Surgeon: Coralie Keens, MD;  Location: [place];  Service: General;  Laterality: N/A;    There were no vitals filed for this visit.    Subjective Assessment - 07/24/21 1151     Subjective Robert Blair has had back pain before.  This episode started with some yard work and was exacerbated by an exercise in his Pilates class (flexion activities).  He is feeling better but is apprehensive about returning to his normal active lifestyle for fear of flaring his back and R hip/gluteals up again.    Pertinent History Elbow OA, L hip THA, hand OA, L first MTP fusion (R is next), previous hernia repair, HTN, hypothyroid    How long  can you sit comfortably? Sitting is most limited (10-15 minutes)    How long can you stand comfortably? Depends on what he is doing.  Very limited with flexion.    How long can you walk comfortably? Is walking short distances without pain.  Walking for exercise encouraged.    Patient Stated Goals Get back to normal activities without pain.    Currently in Pain? Yes    Pain Score 3     Pain Location Hip    Pain Orientation Right    Pain Descriptors / Indicators Aching;Sore;Tightness    Pain Type Chronic pain    Pain Radiating Towards To R foot    Pain Onset More than a month ago    Pain Frequency Intermittent    Aggravating Factors   Prolonged postures (sitting) and sitting    Pain Relieving Factors Backing off normal workout activities (Pilates)    Effect of Pain on Daily Activities Not doing his normal active stuff    Multiple Pain Sites No                [facility] PT Assessment - 07/24/21 0001       Assessment   Medical Diagnosis Low back and R hip/gluteal pain    Referring Provider (PT) Pete Pelt PA-C    Onset Date/Surgical Date --   ~ 2 months ago (yard work/Pilates)     Precautions   Precautions Back    Precaution Comments Avoid flexed postures, bending and twisting (flexion with rotation), prolonged postures (particularly sitting) and discussed the importance of walking for exercise      Restrictions   Weight Bearing Restrictions No      Balance Screen   Has the patient fallen in the past 6 months No    Has the patient had a decrease in activity level because of a fear of falling?  No    Is the patient reluctant to leave their home because of a fear of falling?  No      Prior Function   Level of Independence Independent    Vocation Retired    Leisure Very active (Pilates, walking, exercise)      Cognition   Overall Cognitive Status Within Functional Limits for tasks assessed      Observation/Other Assessments   Focus on Therapeutic Outcomes (FOTO)  56 (Goal 75 in 11 visits)      ROM / Strength   AROM / PROM / Strength AROM;Strength      AROM   Overall AROM  Deficits    AROM Assessment Site Lumbar;Hip    Right/Left Hip Left;Right    Right Hip Flexion 90    Right Hip External Rotation  28    Right Hip Internal Rotation  1    Left Hip Flexion 85    Left Hip External Rotation  24    Left Hip Internal Rotation  10    Lumbar Extension 5    Lumbar - Right Side Bend 25    Lumbar - Left Side Bend 25      Strength   Overall Strength Comments Deferred due to hip tightness      Flexibility   Soft Tissue Assessment /Muscle Length yes    Hamstrings 50 degrees/50 degrees                         Objective measurements completed on examination: See above findings.       [facility] Adult  PT Treatment/Exercise - 07/24/21 0001       Posture/Postural Control   Posture/Postural Control Postural limitations    Postural Limitations Forward head;Rounded Shoulders;Decreased lumbar lordosis      Therapeutic Activites    Therapeutic Activities ADL's;Other Therapeutic Activities    ADL's Discussed golfers and diagonal lift along with bed mobility    Other Therapeutic Activities Reviewed practical spine anatomy with spine model to clarify the importance of avoiding flexion with rotation, reviewed exam findings and starter HEP      Exercises   Exercises Lumbar      Lumbar Exercises: Stretches   Single Knee to Chest Stretch Left;Right;4 reps;20 seconds    Figure 4 Stretch 4 reps;20 seconds    Other Lumbar Stretch Exercise Standing lumbar/trunk extension (hip forward) 10X 3 seconds      Lumbar Exercises: Standing   Scapular Retraction Strengthening;Both;10 reps;Limitations    Scapular Retraction Limitations Shoulder blade pinches 10X 5 seconds                     PT Education - 07/24/21 1156     Education Details Reviewed exam findings, basic posture and spine anatomy and started phase 1 HEP.    Person(s) Educated Patient    Methods Explanation;Handout;Demonstration;Tactile cues;Verbal cues    Comprehension Verbal cues required;Need further instruction;Returned demonstration;Verbalized understanding;Tactile cues required              PT Short Term Goals - 07/24/21 1202       PT SHORT TERM GOAL #1   Title Improve trunk extension AROM to 10 degrees.    Baseline 5 degrees    Time 4    Period Weeks    Status New    Target Date 08/21/21      PT SHORT TERM GOAL #2   Title Improve hip flexion to 95 degrees bilaterally.    Baseline 85-90 degrees    Time 4    Period Weeks    Status New    Target Date 08/21/21               PT Long  Term Goals - 07/24/21 1203       PT LONG TERM GOAL #1   Title Improve FOTO to 75.    Baseline 56    Time 8    Period Weeks    Status New    Target Date 09/18/21      PT LONG TERM GOAL #2   Title Robert Blair will report low back and R hip/gluteal pain consistently 0-2/10 on the Numeric Pain Rating Scale.    Baseline Can be 5-7/10 at worst    Time 8    Period Weeks    Status New    Target Date 09/18/21      PT LONG TERM GOAL #3   Title Improve trunk extension AROM to 15 degrees.    Baseline 5 degrees.    Time 8    Period Weeks    Status New    Target Date 09/18/21      PT LONG TERM GOAL #4   Title Improve B hip ER to 35-40 degrees.    Baseline 24-28 degrees    Time 8    Period Weeks    Status New    Target Date 09/18/21      PT LONG TERM GOAL #5   Title Robert Blair will be independent with his long-term maintenece HEP at DC.    Baseline Started today  Time 8    Period Weeks    Status New    Target Date 09/18/21                    Plan - 07/24/21 1157     Clinical Impression Statement Robert Blair is very active but he has been limited by low back, R "hip" (gluteal) and R leg pain.  Things have been getting better with taking it easy and he would like to get back into his normal activities.  He is seeking guidance about what he can do and what he can avoid.  He would also like to get stronger and avoid future flare-ups.  He is a great PT candidate.    Personal Factors and Comorbidities Comorbidity 3+    Comorbidities Elbow OA, L hip THA, hand OA, L first MTP fusion (R is next), previous hernia repair, HTN, hypothyroid    Examination-Activity Limitations Stand;Bed Mobility;Bend;Sit;Lift;Carry    Examination-Participation Restrictions Community Activity    Stability/Clinical Decision Making Stable/Uncomplicated    Clinical Decision Making Low    Rehab Potential Good    PT Frequency 2x / week    PT Duration 8 weeks    PT Treatment/Interventions ADLs/Self Care Home  Management;Electrical Stimulation;Cryotherapy;Traction;Therapeutic activities;Neuromuscular re-education;Therapeutic exercise;Patient/family education;Manual techniques;Dry needling    PT Next Visit Plan Review day 1 HEP, postural and body mechanics practical activities, low back strengthening (extension emphasis)    PT Home Exercise Plan Access Code: [email or web address]    Consulted and Agree with Plan of Care Patient             Patient will benefit from skilled therapeutic intervention in order to improve the following deficits and impairments:  Decreased activity tolerance, Decreased endurance, Decreased range of motion, Difficulty walking, Decreased strength, Increased edema, Increased muscle spasms, Impaired flexibility, Impaired perceived functional ability, Improper body mechanics, Postural dysfunction, Pain  Visit Diagnosis: Abnormal posture  Difficulty walking  Muscle weakness (generalized)  Chronic right-sided low back pain with right-sided sciatica     Problem List Patient Active Problem List   Diagnosis Date Noted   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 06/16/2021   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 06/16/2021   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 06/16/2021   Mass of finger of right hand 06/16/2021   Hallux rigidus, right foot    Pain from implanted hardware    Hallux rigidus, left foot 07/30/2020   Pain in right shoulder 07/30/2020   Great toe pain, right 05/09/2020   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   Unilateral primary osteoarthritis, left hip 09/22/2019   Tendinitis, de Quervain's 06/02/2019   Pain of left hip joint 02/07/2019   Recurrent right inguinal hernia 10/29/2014   Hypertension 01/01/2012   Hypothyroidism 01/01/2012   History of adenomatous polyp of colon 01/01/2012    Farley Ly, PT, MPT 07/24/2021, 12:09  PM  [facility] [address] [place], [place], [address] Phone: [phone]   Fax:  [phone]  Name: Robert Blair MRN: [id number] Date of Birth: [birth year]

## 2021-07-24 NOTE — Patient Instructions (Signed)
Access Code: [email or web address] URL: [email or web address] Date: 07/24/2021 Prepared by: Vista Mink  Exercises Standing Lumbar Extension at [place] 5 x daily - 7 x weekly - 1 sets - 5 reps - 3 seconds hold Standing Scapular Retraction - 5 x daily - 7 x weekly - 1 sets - 5 reps - 5 second hold Single Knee to Chest Stretch - 2-3 x daily - 7 x weekly - 1 sets - 4-5 reps - 20 seconds hold Supine Figure 4 Piriformis Stretch - 2-3 x daily - 7 x weekly - 1 sets - 5 reps - 20 seconds hold

## 2021-07-28 ENCOUNTER — Encounter (HOSPITAL_BASED_OUTPATIENT_CLINIC_OR_DEPARTMENT_OTHER): Payer: Self-pay | Admitting: Orthopedic Surgery

## 2021-07-28 ENCOUNTER — Other Ambulatory Visit: Payer: Self-pay

## 2021-07-28 NOTE — Progress Notes (Signed)
Per pt request I called his PCP office and requested his most recent EKG done in March of this year. Office is to fax those results to [place] at [phone].

## 2021-07-29 ENCOUNTER — Encounter: Payer: Self-pay | Admitting: Physical Therapy

## 2021-07-29 ENCOUNTER — Ambulatory Visit (INDEPENDENT_AMBULATORY_CARE_PROVIDER_SITE_OTHER): Payer: BC Managed Care – PPO | Admitting: Physical Therapy

## 2021-07-29 ENCOUNTER — Encounter (HOSPITAL_BASED_OUTPATIENT_CLINIC_OR_DEPARTMENT_OTHER)
Admission: RE | Admit: 2021-07-29 | Discharge: 2021-07-29 | Disposition: A | Discharge status: Home / Self Care | Payer: BC Managed Care – PPO | Source: Ambulatory Visit | Attending: Orthopedic Surgery | Admitting: Orthopedic Surgery

## 2021-07-29 DIAGNOSIS — R293 Abnormal posture: Secondary | ICD-10-CM

## 2021-07-29 DIAGNOSIS — M6281 Muscle weakness (generalized): Secondary | ICD-10-CM | POA: Diagnosis not present

## 2021-07-29 DIAGNOSIS — M5441 Lumbago with sciatica, right side: Secondary | ICD-10-CM | POA: Diagnosis not present

## 2021-07-29 DIAGNOSIS — R262 Difficulty in walking, not elsewhere classified: Secondary | ICD-10-CM | POA: Diagnosis not present

## 2021-07-29 DIAGNOSIS — Z01812 Encounter for preprocedural laboratory examination: Secondary | ICD-10-CM | POA: Diagnosis present

## 2021-07-29 DIAGNOSIS — G8929 Other chronic pain: Secondary | ICD-10-CM

## 2021-07-29 LAB — BASIC METABOLIC PANEL
Anion gap: 11 (ref 5–15)
BUN: 20 mg/dL (ref 8–23)
CO2: 24 mmol/L (ref 22–32)
Calcium: 9.6 mg/dL (ref 8.9–10.3)
Chloride: 102 mmol/L (ref 98–111)
Creatinine, Ser: 1.02 mg/dL (ref 0.61–1.24)
GFR, Estimated: >60 mL/min (ref >60)
Glucose, Bld: 96 mg/dL (ref 70–99)
Potassium: 3.4 mmol/L — ABNORMAL LOW (ref 3.5–5.1)
Sodium: 137 mmol/L (ref 135–145)

## 2021-07-29 NOTE — Therapy (Signed)
[facility] [address] [place], [place], [address] Phone: [phone]   Fax:  [phone]  Physical Therapy Treatment  Patient Details  Name: Robert Blair MRN: [id number] Date of Birth: 01-06-1956 Referring Provider (PT): Pete Pelt PA-C   Encounter Date: 07/29/2021   PT End of Session - 07/29/21 0926     Visit Number 2    Number of Visits 12    Date for PT Re-Evaluation 09/18/21    Progress Note Due on Visit 10    PT Start Time 5732    PT Stop Time 0926    PT Time Calculation (min) 39 min    Activity Tolerance Patient tolerated treatment well;No increased pain    Behavior During Therapy [facility] for tasks assessed/performed             Past Medical History:  Diagnosis Date   Arthritis    Cancer ([place]) 2016   Basal cell   Complication of anesthesia    URINARY RETENTION   Depression    History of adenomatous polyp of colon    2008/  2013/  2016   History of basal cell carcinoma excision    2015  nose   History of urinary retention    10-30-2014  POST OP   Hypothyroidism    Seasonal allergies    Unspecified essential hypertension     Past Surgical History:  Procedure Laterality Date   ARTHRODESIS METATARSALPHALANGEAL JOINT (MTPJ) Right 08/13/2020   Procedure: FUSION RIGHT GREAT TOE METATARSALPHALANGEAL JOINT (MTPJ);  Surgeon: Newt Minion, MD;  Location: [place];  Service: Orthopedics;  Laterality: Right;   COLONOSCOPY  last one 02-25-2015   HYDROCELE EXCISION Right 08/26/2015   Procedure: RIGHT HYDROCELECTOMY ADULT;  Surgeon: Franchot Gallo, MD;  Location: [facility];  Service: Urology;  Laterality: Right;   INGUINAL HERNIA REPAIR Right 10/29/2014   Procedure: LAPAROSCOPIC REPAIR RECURRENT RIGHT INGUINAL ;  Surgeon: Fanny Skates, MD;  Location[facility];  Service: General;  Laterality: Right;   INGUINAL HERNIA REPAIR Right 1984   INSERTION OF MESH Right 10/29/2014   Procedure:  INSERTION OF MESH;  Surgeon: Fanny Skates, MD;  [facility];  Service: General;  Laterality: Right;   INSERTION OF MESH N/A 06/17/2020   Procedure: INSERTION OF MESH;  Surgeon: Coralie Keens, MD;  Location: [place];  Service: General;  Laterality: N/A;   POLYPECTOMY     TOE OSTEOTOMY Bilateral right 2002/  left 2013   great toe   TOTAL HIP ARTHROPLASTY Left 09/22/2019   Procedure: LEFT TOTAL HIP ARTHROPLASTY ANTERIOR APPROACH;  Surgeon: Mcarthur Rossetti, MD;  Location: [facility];  Service: Orthopedics;  Laterality: Left;   UMBILICAL HERNIA REPAIR N/A 06/17/2020   Procedure: UMBILICAL HERNIA REPAIR;  Surgeon: Coralie Keens, MD;  Location: [place];  Service: General;  Laterality: N/A;    There were no vitals filed for this visit.   Subjective Assessment - 07/29/21 0850     Subjective doing his exercises - no change yet.    Pertinent History Elbow OA, L hip THA, hand OA, L first MTP fusion (R is next), previous hernia repair, HTN, hypothyroid    How long can you sit comfortably? Sitting is most limited (10-15 minutes)    How long can you stand comfortably? Depends on what he is doing.  Very limited with flexion.    How long can you walk comfortably? Is walking short distances without pain.  Walking for exercise encouraged.    Patient Stated Goals Get back to normal activities without pain.    Currently in Pain? Yes    Pain Score 3     Pain Location Hip    Pain Orientation Right    Pain Descriptors / Indicators Aching;Sore;Tightness    Pain Type Chronic pain    Pain Onset More than a month ago    Pain Frequency Intermittent    Aggravating Factors  prolonged postures (sitting)    Pain Relieving Factors backing off of pilates                               [facility] Adult PT Treatment/Exercise - 07/29/21 0852       Lumbar Exercises: Stretches   Single Knee to Chest Stretch Left;Right;20 seconds;3 reps    Figure 4  Stretch 3 reps;Supine;With overpressure;20 seconds    Other Lumbar Stretch Exercise Standing lumbar/trunk extension (hip forward) 10X 3 seconds      Lumbar Exercises: Aerobic   Nustep L5 x 8 min      Lumbar Exercises: Standing   Scapular Retraction Strengthening;Both;20 reps      Lumbar Exercises: Supine   Bridge 20 reps;5 seconds      Lumbar Exercises: Sidelying   Clam Right;20 reps;3 seconds                       PT Short Term Goals - 07/24/21 1202       PT SHORT TERM GOAL #1   Title Improve trunk extension AROM to 10 degrees.    Baseline 5 degrees    Time 4    Period Weeks    Status New    Target Date 08/21/21      PT SHORT TERM GOAL #2   Title Improve hip flexion to 95 degrees bilaterally.    Baseline 85-90 degrees    Time 4    Period Weeks    Status New    Target Date 08/21/21               PT Long Term Goals - 07/24/21 1203       PT LONG TERM GOAL #1   Title Improve FOTO to 75.    Baseline 56    Time 8    Period Weeks    Status New    Target Date 09/18/21      PT LONG TERM GOAL #2   Title Timmothy Sours will report low back and R hip/gluteal pain consistently 0-2/10 on the Numeric Pain Rating Scale.    Baseline Can be 5-7/10 at worst    Time 8    Period Weeks    Status New    Target Date 09/18/21      PT LONG TERM GOAL #3   Title Improve trunk extension AROM to 15 degrees.    Baseline 5 degrees.    Time 8    Period Weeks    Status New    Target Date 09/18/21      PT LONG TERM GOAL #4   Title Improve B hip ER to 35-40 degrees.    Baseline 24-28 degrees    Time 8    Period Weeks    Status New    Target Date 09/18/21      PT LONG TERM GOAL #5   Title Timmothy Sours will be independent with his long-term maintenece HEP at [place].  Baseline Started today    Time 8    Period Weeks    Status New    Target Date 09/18/21                   Plan - 07/29/21 0926     Clinical Impression Statement Session focused on review of HEP which  pt needed mod cues for as well as core and hip strengthening exercises.  Will continue to benefit from PT to maximize function.    Personal Factors and Comorbidities Comorbidity 3+    Comorbidities Elbow OA, L hip THA, hand OA, L first MTP fusion (R is next), previous hernia repair, HTN, hypothyroid    Examination-Activity Limitations Stand;Bed Mobility;Bend;Sit;Lift;Carry    Examination-Participation Restrictions Community Activity    Stability/Clinical Decision Making Stable/Uncomplicated    Rehab Potential Good    PT Frequency 2x / week    PT Duration 8 weeks    PT Treatment/Interventions ADLs/Self Care Home Management;Electrical Stimulation;Cryotherapy;Traction;Therapeutic activities;Neuromuscular re-education;Therapeutic exercise;Patient/family education;Manual techniques;Dry needling    PT Next Visit Plan postural and body mechanics practical activities, low back strengthening (extension emphasis); core/hip strengthening    PT Home Exercise Plan Access Code: [email or web address]    Consulted and Agree with Plan of Care Patient             Patient will benefit from skilled therapeutic intervention in order to improve the following deficits and impairments:  Decreased activity tolerance, Decreased endurance, Decreased range of motion, Difficulty walking, Decreased strength, Increased edema, Increased muscle spasms, Impaired flexibility, Impaired perceived functional ability, Improper body mechanics, Postural dysfunction, Pain  Visit Diagnosis: Abnormal posture  Difficulty walking  Muscle weakness (generalized)  Chronic right-sided low back pain with right-sided sciatica     Problem List Patient Active Problem List   Diagnosis Date Noted   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 06/16/2021   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 06/16/2021   Degenerative arthritis of metacarpophalangeal joint of index  finger of left hand 06/16/2021   Mass of finger of right hand 06/16/2021   Hallux rigidus, right foot    Pain from implanted hardware    Hallux rigidus, left foot 07/30/2020   Pain in right shoulder 07/30/2020   Great toe pain, right 05/09/2020   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   Unilateral primary osteoarthritis, left hip 09/22/2019   Tendinitis, de Quervain's 06/02/2019   Pain of left hip joint 02/07/2019   Recurrent right inguinal hernia 10/29/2014   Hypertension 01/01/2012   Hypothyroidism 01/01/2012   History of adenomatous polyp of colon 01/01/2012      Laureen Abrahams, PT, DPT 07/29/21 9:28 AM    [facility] [address] [place], [place], [address] Phone: [phone]   Fax:  [phone]  Name: Robert Blair MRN: [id number] Date of Birth: 07/24/1956

## 2021-08-04 ENCOUNTER — Other Ambulatory Visit: Payer: Self-pay

## 2021-08-04 ENCOUNTER — Ambulatory Visit (HOSPITAL_BASED_OUTPATIENT_CLINIC_OR_DEPARTMENT_OTHER): Payer: BC Managed Care – PPO | Admitting: Anesthesiology

## 2021-08-04 ENCOUNTER — Ambulatory Visit (HOSPITAL_BASED_OUTPATIENT_CLINIC_OR_DEPARTMENT_OTHER)
Admission: RE | Admit: 2021-08-04 | Discharge: 2021-08-04 | Disposition: A | Discharge status: Home / Self Care | Payer: BC Managed Care – PPO | Source: Ambulatory Visit | Attending: Orthopedic Surgery | Admitting: Orthopedic Surgery

## 2021-08-04 ENCOUNTER — Encounter (HOSPITAL_BASED_OUTPATIENT_CLINIC_OR_DEPARTMENT_OTHER)
Admission: RE | Disposition: A | Discharge status: Home / Self Care | Payer: Self-pay | Source: Ambulatory Visit | Attending: Orthopedic Surgery

## 2021-08-04 ENCOUNTER — Encounter (HOSPITAL_BASED_OUTPATIENT_CLINIC_OR_DEPARTMENT_OTHER): Payer: Self-pay | Admitting: Orthopedic Surgery

## 2021-08-04 DIAGNOSIS — Z79899 Other long term (current) drug therapy: Secondary | ICD-10-CM | POA: Diagnosis not present

## 2021-08-04 DIAGNOSIS — R2231 Localized swelling, mass and lump, right upper limb: Secondary | ICD-10-CM

## 2021-08-04 DIAGNOSIS — G5621 Lesion of ulnar nerve, right upper limb: Secondary | ICD-10-CM | POA: Diagnosis not present

## 2021-08-04 DIAGNOSIS — I10 Essential (primary) hypertension: Secondary | ICD-10-CM | POA: Diagnosis not present

## 2021-08-04 DIAGNOSIS — Z6833 Body mass index (BMI) 33.0-33.9, adult: Secondary | ICD-10-CM | POA: Insufficient documentation

## 2021-08-04 DIAGNOSIS — E669 Obesity, unspecified: Secondary | ICD-10-CM | POA: Insufficient documentation

## 2021-08-04 HISTORY — PX: GANGLION CYST EXCISION: SHX1691

## 2021-08-04 SURGERY — EXCISION, GANGLION CYST, WRIST
Anesthesia: Monitor Anesthesia Care | Site: Finger | Laterality: Right

## 2021-08-04 MED ORDER — FENTANYL CITRATE (PF) 100 MCG/2ML IJ SOLN
INTRAMUSCULAR | Status: DC | PRN
  Administered 2021-08-04: 50 ug via INTRAVENOUS

## 2021-08-04 MED ORDER — OXYCODONE HCL 5 MG PO TABS: 5.0000 mg | ORAL_TABLET | Freq: Once | ORAL | Status: DC | PRN

## 2021-08-04 MED ORDER — MEPERIDINE HCL 25 MG/ML IJ SOLN: 6.2500 mg | INTRAMUSCULAR | Status: DC | PRN

## 2021-08-04 MED ORDER — MIDAZOLAM HCL 5 MG/5ML IJ SOLN
INTRAMUSCULAR | Status: DC | PRN
  Administered 2021-08-04: 2 mg via INTRAVENOUS

## 2021-08-04 MED ORDER — FENTANYL CITRATE (PF) 100 MCG/2ML IJ SOLN: 25.0000 ug | INTRAMUSCULAR | Status: DC | PRN

## 2021-08-04 MED ORDER — 0.9 % SODIUM CHLORIDE (POUR BTL) OPTIME
TOPICAL | Status: DC | PRN
  Administered 2021-08-04: 50 mL

## 2021-08-04 MED ORDER — OXYCODONE HCL 5 MG/5ML PO SOLN: 5.0000 mg | Freq: Once | ORAL | Status: DC | PRN

## 2021-08-04 MED ORDER — KETOROLAC TROMETHAMINE 15 MG/ML IJ SOLN
15.0000 mg | Freq: Once | INTRAMUSCULAR | Status: AC
  Administered 2021-08-04: 15 mg via INTRAVENOUS

## 2021-08-04 MED ORDER — KETOROLAC TROMETHAMINE 30 MG/ML IJ SOLN
INTRAMUSCULAR | Status: AC
  Filled 2021-08-04: qty 1

## 2021-08-04 MED ORDER — MIDAZOLAM HCL 2 MG/2ML IJ SOLN
INTRAMUSCULAR | Status: AC
  Filled 2021-08-04: qty 2

## 2021-08-04 MED ORDER — PROPOFOL 500 MG/50ML IV EMUL
INTRAVENOUS | Status: AC
  Filled 2021-08-04: qty 50

## 2021-08-04 MED ORDER — ACETAMINOPHEN 325 MG PO TABS: 325.0000 mg | ORAL_TABLET | ORAL | Status: DC | PRN

## 2021-08-04 MED ORDER — LIDOCAINE HCL (PF) 1 % IJ SOLN
INTRAMUSCULAR | Status: AC
  Filled 2021-08-04: qty 30

## 2021-08-04 MED ORDER — FENTANYL CITRATE (PF) 100 MCG/2ML IJ SOLN
INTRAMUSCULAR | Status: AC
  Filled 2021-08-04: qty 2

## 2021-08-04 MED ORDER — PROPOFOL 500 MG/50ML IV EMUL
INTRAVENOUS | Status: DC | PRN
  Administered 2021-08-04: 200 ug/kg/min via INTRAVENOUS
  Administered 2021-08-04: 10 mg via INTRAVENOUS
  Administered 2021-08-04: 60 mg via INTRAVENOUS

## 2021-08-04 MED ORDER — ONDANSETRON HCL 4 MG/2ML IJ SOLN: 4.0000 mg | Freq: Once | INTRAMUSCULAR | Status: DC | PRN

## 2021-08-04 MED ORDER — LACTATED RINGERS IV SOLN: INTRAVENOUS | Status: DC

## 2021-08-04 MED ORDER — ONDANSETRON HCL 4 MG/2ML IJ SOLN
INTRAMUSCULAR | Status: DC | PRN
  Administered 2021-08-04: 4 mg via INTRAVENOUS

## 2021-08-04 MED ORDER — ACETAMINOPHEN 160 MG/5ML PO SOLN: 325.0000 mg | ORAL | Status: DC | PRN

## 2021-08-04 MED ORDER — LIDOCAINE-EPINEPHRINE (PF) 1 %-1:200000 IJ SOLN
INTRAMUSCULAR | Status: DC | PRN
  Administered 2021-08-04: 6.5 mL

## 2021-08-04 SURGICAL SUPPLY — 45 items
APL PRP STRL LF DISP 70% ISPRP (MISCELLANEOUS) ×1
BLADE SURG 15 STRL LF DISP TIS (BLADE) ×2 IMPLANT
BLADE SURG 15 STRL SS (BLADE) ×4
BNDG CMPR 9X4 STRL LF SNTH (GAUZE/BANDAGES/DRESSINGS) ×1
BNDG COHESIVE 1X5 TAN STRL LF (GAUZE/BANDAGES/DRESSINGS) ×2 IMPLANT
BNDG ELASTIC 3X5.8 VLCR STR LF (GAUZE/BANDAGES/DRESSINGS) IMPLANT
BNDG ESMARK 4X9 LF (GAUZE/BANDAGES/DRESSINGS) ×2 IMPLANT
BNDG GAUZE ELAST 4 BULKY (GAUZE/BANDAGES/DRESSINGS) IMPLANT
BNDG PLASTER X FAST 3X3 WHT LF (CAST SUPPLIES) IMPLANT
BNDG PLSTR 9X3 FST ST WHT (CAST SUPPLIES)
CHLORAPREP W/TINT 26 (MISCELLANEOUS) ×2 IMPLANT
CORD BIPOLAR FORCEPS 12FT (ELECTRODE) ×2 IMPLANT
COVER BACK TABLE 60X90IN (DRAPES) ×2 IMPLANT
COVER MAYO STAND STRL (DRAPES) ×2 IMPLANT
CUFF TOURN SGL QUICK 18X4 (TOURNIQUET CUFF) IMPLANT
CUFF TOURN SGL QUICK 24 (TOURNIQUET CUFF)
CUFF TRNQT CYL 24X4X16.5-23 (TOURNIQUET CUFF) IMPLANT
DRAPE EXTREMITY T 121X128X90 (DISPOSABLE) ×2 IMPLANT
DRAPE SURG 17X23 STRL (DRAPES) IMPLANT
DRAPE U-SHAPE 47X51 STRL (DRAPES) ×2 IMPLANT
GAUZE 4X4 16PLY ~~LOC~~+RFID DBL (SPONGE) ×2 IMPLANT
GAUZE SPONGE 4X4 12PLY STRL (GAUZE/BANDAGES/DRESSINGS) ×2 IMPLANT
GAUZE XEROFORM 1X8 LF (GAUZE/BANDAGES/DRESSINGS) ×2 IMPLANT
GLOVE SURG ENC MOIS LTX SZ7 (GLOVE) ×2 IMPLANT
GLOVE SURG POLYISO LF SZ6.5 (GLOVE) ×2 IMPLANT
GLOVE SURG UNDER POLY LF SZ7 (GLOVE) ×6 IMPLANT
GOWN STRL REUS W/ TWL LRG LVL3 (GOWN DISPOSABLE) ×1 IMPLANT
GOWN STRL REUS W/TWL LRG LVL3 (GOWN DISPOSABLE) ×2
GOWN STRL REUS W/TWL XL LVL3 (GOWN DISPOSABLE) ×2 IMPLANT
NEEDLE HYPO 25X1 1.5 SAFETY (NEEDLE) ×2 IMPLANT
NS IRRIG 1000ML POUR BTL (IV SOLUTION) ×2 IMPLANT
PACK BASIN DAY SURGERY FS (CUSTOM PROCEDURE TRAY) ×2 IMPLANT
PAD CAST 3X4 CTTN HI CHSV (CAST SUPPLIES) IMPLANT
PADDING CAST COTTON 3X4 STRL (CAST SUPPLIES)
SLEEVE SCD COMPRESS KNEE MED (STOCKING) IMPLANT
SUCTION FRAZIER HANDLE 12FR (TUBING)
SUCTION TUBE FRAZIER 12FR DISP (TUBING) IMPLANT
SUT ETHILON 4 0 PS 2 18 (SUTURE) ×2 IMPLANT
SUT MNCRL AB 3-0 PS2 18 (SUTURE) IMPLANT
SUT VICRYL 4-0 PS2 18IN ABS (SUTURE) IMPLANT
SYR BULB EAR ULCER 3OZ GRN STR (SYRINGE) ×2 IMPLANT
SYR CONTROL 10ML LL (SYRINGE) ×2 IMPLANT
TOWEL GREEN STERILE FF (TOWEL DISPOSABLE) ×2 IMPLANT
TUBE CONNECTING 20X1/4 (TUBING) IMPLANT
UNDERPAD 30X36 HEAVY ABSORB (UNDERPADS AND DIAPERS) IMPLANT

## 2021-08-04 NOTE — Transfer of Care (Signed)
Immediate Anesthesia Transfer of Care Note  Patient: Robert Blair  Procedure(s) Performed: EXCISION MASS RIGHT MIDDLE FINGER (Right: Finger)  Patient Location: PACU  Anesthesia Type:MAC  Level of Consciousness: drowsy  Airway & Oxygen Therapy: Patient Spontanous Breathing and Patient connected to face mask oxygen  Post-op Assessment: Report given to RN and Post -op Vital signs reviewed and stable  Post vital signs: Reviewed and stable  Last Vitals:  Vitals Value Taken Time  BP    Temp    Pulse 70 08/04/21 1350  Resp 11 08/04/21 1350  SpO2 96 % 08/04/21 1350  Vitals shown include unvalidated device data.  Last Pain:  Vitals:   08/04/21 1203  TempSrc: Oral  PainSc: 0-No pain         Complications: No notable events documented.

## 2021-08-04 NOTE — Anesthesia Preprocedure Evaluation (Signed)
Anesthesia Evaluation  Patient identified by MRN, date of birth, ID band Patient awake    Reviewed: Allergy & Precautions, NPO status , Patient's Chart, lab work & pertinent test results, reviewed documented beta blocker date and time   Airway Mallampati: I       Dental no notable dental hx. (+) Teeth Intact, Dental Advisory Given   Pulmonary neg pulmonary ROS,    Pulmonary exam normal        Cardiovascular hypertension, Pt. on medications and Pt. on home beta blockers Normal cardiovascular exam     Neuro/Psych PSYCHIATRIC DISORDERS Depression negative neurological ROS     GI/Hepatic negative GI ROS, Neg liver ROS,   Endo/Other    Renal/GU negative Renal ROS  negative genitourinary   Musculoskeletal  (+) Arthritis , Osteoarthritis,    Abdominal (+) + obese,   Peds  Hematology   Anesthesia Other Findings   Reproductive/Obstetrics                             Anesthesia Physical Anesthesia Plan  ASA: 2  Anesthesia Plan: MAC   Post-op Pain Management:    Induction:   PONV Risk Score and Plan: 1 and Ondansetron, Dexamethasone and Midazolam  Airway Management Planned: Natural Airway and Simple Face Mask  Additional Equipment: None  Intra-op Plan:   Post-operative Plan:   Informed Consent: I have reviewed the patients History and Physical, chart, labs and discussed the procedure including the risks, benefits and alternatives for the proposed anesthesia with the patient or authorized representative who has indicated his/her understanding and acceptance.     Dental advisory given  Plan Discussed with: CRNA  Anesthesia Plan Comments:         Anesthesia Quick Evaluation

## 2021-08-04 NOTE — Interval H&P Note (Signed)
History and Physical Interval Note:  08/04/2021 11:51 AM  Robert Blair  has presented today for surgery, with the diagnosis of Right Middle Finger Mass.  The various methods of treatment have been discussed with the patient and family. After consideration of risks, benefits and other options for treatment, the patient has consented to  Procedure(s): MASS EXCISION RIGHT MIDDLE FINGER (Right) as a surgical intervention.  The patient's history has been reviewed, patient examined, no change in status, stable for surgery.  I have reviewed the patient's chart and labs.  Questions were answered to the patient's satisfaction.     Robert Blair

## 2021-08-04 NOTE — Op Note (Signed)
   Date of Surgery: 08/04/2021  INDICATIONS: Robert Blair is a 65 y.o.-year-old male with right middle finger mass.  The mass has no changed in size or character since it was incidentally noticed.  It is bothersome only when excessive pressure is placed on the lesion.  It is otherwise not painful.  Risks, including infection, damage to the adjacent ulnar digital nerve, recurrence, or wound healing issues, were again discussed.   The patient wished to proceed with surgery and informed consent was signed after our discussion.   PREOPERATIVE DIAGNOSIS:  Right middle finger mass  POSTOPERATIVE DIAGNOSIS: Same.  PROCEDURE:  Excision of right middle finger mass   SURGEON: Audria Nine, M.D.  ASSIST:   ANESTHESIA:  Local with MAC  IV FLUIDS AND URINE: See anesthesia.  ESTIMATED BLOOD LOSS: 1 mL.  IMPLANTS: * No implants in log *   DRAINS: None  COMPLICATIONS: see description of procedure.  DESCRIPTION OF PROCEDURE: The patient was met in the preoperative holding area where the surgical site was marked and the consent form was verified.  The patient was then taken to the operating room and transferred to the operating table.  All bony prominences were well padded.  A tourniquet was applied to the right forearm.  The operative extremity was prepped and draped in the usual and sterile fashion.  A formal time-out was performed to confirm that this was the correct patient, surgery, side, and site.   Following timeout, a digital block was performed with 1% plain lidocaine.  The limb was elevated and the tourniquet inflated to 250 mmHg. A longitudinal incision was made directly over the mass.  Two Guthrie skin hooks were used to retract the skin edges.  Blunt dissection was used to identify the mass which was easily dissected from the adjacent tissue.  The mass was adjacent to the flexor tendon sheath with the ulnar digital nerve draped around it. The nerve was bluntly dissected away from the  mass using curved tenotomy scissors.  The mass was removed en bloc.  It was passed off the back table to be sent to pathology.  The wound was then thoroughly irrigated.  The tourniquet was deflated and hemostasis achieved using direct pressure over the wound and bipolar electrocautery.  The wound was then closed using 4-0 nylon sutures.  The wound was dressed with xeroform, 4x4, and loosely wrapped Coban.  The patient was reversed from sedation and transferred to the postoperative bed.  All counts were correct at the end of the procedure.    POSTOPERATIVE PLAN: The patient will be discharged to home from PACU with appropriate pain medication and discharge instructions.   Audria Nine, MD 1:49 PM

## 2021-08-04 NOTE — Brief Op Note (Signed)
08/04/2021  1:48 PM  PATIENT:  Robert Blair  65 y.o. male  PRE-OPERATIVE DIAGNOSIS:  Right Middle Finger Mass  POST-OPERATIVE DIAGNOSIS:  Right Middle Finger Mass  PROCEDURE:  Procedure(s): EXCISION MASS RIGHT MIDDLE FINGER (Right)  SURGEON:  Surgeon(s) and Role:    * Sherilyn Cooter, MD - Primary  PHYSICIAN ASSISTANT:   ASSISTANTS: none   ANESTHESIA:   local and MAC  EBL:  1 mL   BLOOD ADMINISTERED:none  DRAINS: none   LOCAL MEDICATIONS USED:  LIDOCAINE   SPECIMEN:  Excision  DISPOSITION OF SPECIMEN:  PATHOLOGY  COUNTS:  YES  TOURNIQUET:   Total Tourniquet Time Documented: Forearm (Right) - 16 minutes Total: Forearm (Right) - 16 minutes   DICTATION: .Dragon Dictation  PLAN OF CARE: Discharge to home after PACU  PATIENT DISPOSITION:  PACU - hemodynamically stable.   Delay start of Pharmacological VTE agent (>24hrs) due to surgical blood loss or risk of bleeding: not applicable

## 2021-08-04 NOTE — Discharge Instructions (Addendum)
Audria Nine, M.D. Hand Surgery  POST-OPERATIVE DISCHARGE INSTRUCTIONS   PRESCRIPTIONS: You may have been given a prescription to be taken as directed for post-operative pain control.  You may also take over the counter ibuprofen/aleve and tylenol for pain. Take this as directed on the packaging. Do not exceed 3000 mg tylenol/acetaminophen in 24 hours.  Ibuprofen 600-800 mg (3-4) tablets by mouth every 6 hours as needed for pain.  OR Aleve 2 tablets by mouth every 12 hours (twice daily) as needed for pain.  AND/OR Tylenol 1000 mg (2 tablets) every 8 hours as needed for pain.  Please use your pain medication carefully, as refills are limited and you may not be provided with one.  As stated above, please use over the counter pain medicine - it will also be helpful with decreasing your swelling.    ANESTHESIA: After your surgery, post-surgical discomfort or pain is likely. This discomfort can last several days to a few weeks. At certain times of the day your discomfort may be more intense.   Did you receive a nerve block?  A nerve block can provide pain relief for one hour to two days after your surgery. As long as the nerve block is working, you will experience little or no sensation in the area the surgeon operated on.  As the nerve block wears off, you will begin to experience pain or discomfort. It is very important that you begin taking your prescribed pain medication before the nerve block fully wears off. Treating your pain at the first sign of the block wearing off will ensure your pain is better controlled and more tolerable when full-sensation returns. Do not wait until the pain is intolerable, as the medicine will be less effective. It is better to treat pain in advance than to try and catch up.   General Anesthesia:  If you did not receive a nerve block during your surgery, you will need to start taking your pain medication shortly after your surgery and should continue  to do so as prescribed by your surgeon.     ICE AND ELEVATION: You may use ice for the first 48-72 hours, but it is not critical.   Motion of your fingers is very important s to decrease the swelling.  Elevation, as much as possible for the next 48 hours, is critical for decreasing swelling as well as for pain relief. Elevation means when you are seated or lying down, you hand should be at or above your heart. When walking, the hand needs to be at or above the level of your elbow.  If the bandage gets too tight, it may need to be loosened. Please contact our office and we will instruct you in how to do this.    SURGICAL BANDAGES:  Keep your dressing and/or splint clean and dry at all times.  You can remove your dressing 4 days from now and change with a dry dressing or Band-Aids as needed thereafter. You may place a plastic bag over your bandage to shower, but be careful, do not get your bandages wet.  After the bandages have been removed, it is OK to get the stitches wet in a shower or with hand washing. Do Not soak or submerge the wound yet. Please do not use lotions or creams on the stitches.      HAND THERAPY:  You may not need any. If you do, we will begin this at your follow up visit in the clinic.  ACTIVITY AND WORK: You are encouraged to move any fingers which are not in the bandage.  Light use of the fingers is allowed to assist the other hand with daily hygiene and eating, but strong gripping or lifting is often uncomfortable and should be avoided.  You might miss a variable period of time from work and hopefully this issue has been discussed prior to surgery. You may not do any heavy work with your affected hand for about 2 weeks.    [facility] [address] [place],  [place]  [address] [phone]    Post Anesthesia Home Care Instructions  Activity: Get plenty of rest for the remainder of the day. A responsible individual must stay with you for  24 hours following the procedure.  For the next 24 hours, DO NOT: -Drive a car -Paediatric nurse -Drink alcoholic beverages -Take any medication unless instructed by your physician -Make any legal decisions or sign important papers.  Meals: Start with liquid foods such as gelatin or soup. Progress to regular foods as tolerated. Avoid greasy, spicy, heavy foods. If nausea and/or vomiting occur, drink only clear liquids until the nausea and/or vomiting subsides. Call your physician if vomiting continues.  Special Instructions/Symptoms: Your throat may feel dry or sore from the anesthesia or the breathing tube placed in your throat during surgery. If this causes discomfort, gargle with warm salt water. The discomfort should disappear within 24 hours.  If you had a scopolamine patch placed behind your ear for the management of post- operative nausea and/or vomiting:  1. The medication in the patch is effective for 72 hours, after which it should be removed.  Wrap patch in a tissue and discard in the trash. Wash hands thoroughly with soap and water. 2. You may remove the patch earlier than 72 hours if you experience unpleasant side effects which may include dry mouth, dizziness or visual disturbances. 3. Avoid touching the patch. Wash your hands with soap and water after contact with the patch.

## 2021-08-04 NOTE — Anesthesia Postprocedure Evaluation (Signed)
Anesthesia Post Note  Patient: Robert Blair  Procedure(s) Performed: EXCISION MASS RIGHT MIDDLE FINGER (Right: Finger)     Patient location during evaluation: Phase II Anesthesia Type: MAC Level of consciousness: awake Pain management: pain level controlled Vital Signs Assessment: post-procedure vital signs reviewed and stable Respiratory status: spontaneous breathing Cardiovascular status: stable Postop Assessment: no apparent nausea or vomiting Anesthetic complications: no   No notable events documented.  Last Vitals:  Vitals:   08/04/21 1349 08/04/21 1400  BP: (!) 166/95 (!) 140/100  Pulse: 72 69  Resp: 11 17  Temp: 36.7 C   SpO2: 94% 94%    Last Pain:  Vitals:   08/04/21 1349  TempSrc:   PainSc: 0-No pain                 Huston Foley

## 2021-08-05 ENCOUNTER — Encounter (HOSPITAL_BASED_OUTPATIENT_CLINIC_OR_DEPARTMENT_OTHER): Payer: Self-pay | Admitting: Orthopedic Surgery

## 2021-08-05 LAB — SURGICAL PATHOLOGY

## 2021-08-12 ENCOUNTER — Other Ambulatory Visit: Payer: Self-pay

## 2021-08-12 ENCOUNTER — Ambulatory Visit (INDEPENDENT_AMBULATORY_CARE_PROVIDER_SITE_OTHER): Payer: BC Managed Care – PPO | Admitting: Physical Therapy

## 2021-08-12 DIAGNOSIS — R293 Abnormal posture: Secondary | ICD-10-CM | POA: Diagnosis not present

## 2021-08-12 DIAGNOSIS — M5441 Lumbago with sciatica, right side: Secondary | ICD-10-CM

## 2021-08-12 DIAGNOSIS — R262 Difficulty in walking, not elsewhere classified: Secondary | ICD-10-CM | POA: Diagnosis not present

## 2021-08-12 DIAGNOSIS — G8929 Other chronic pain: Secondary | ICD-10-CM

## 2021-08-12 DIAGNOSIS — M6281 Muscle weakness (generalized): Secondary | ICD-10-CM | POA: Diagnosis not present

## 2021-08-12 NOTE — Therapy (Signed)
[facility] [address] [place], [place], [address] Phone: [phone]   Fax:  [phone]  Physical Therapy Treatment  Patient Details  Name: Robert Blair MRN: [id number] Date of Birth: 03/07/1956 Referring Provider (PT): Pete Pelt PA-C   Encounter Date: 08/12/2021   PT End of Session - 08/12/21 0929     Visit Number 3    Number of Visits 12    Date for PT Re-Evaluation 09/18/21    Progress Note Due on Visit 10    PT Start Time 0845    PT Stop Time 0927    PT Time Calculation (min) 42 min    Activity Tolerance Patient tolerated treatment well;No increased pain    Behavior During Therapy [facility] for tasks assessed/performed             Past Medical History:  Diagnosis Date   Arthritis    Cancer ([place]) 2016   Basal cell   Complication of anesthesia    URINARY RETENTION   Depression    History of adenomatous polyp of colon    2008/  2013/  2016   History of basal cell carcinoma excision    2015  nose   History of urinary retention    10-30-2014  POST OP   Hypothyroidism    Seasonal allergies    Unspecified essential hypertension     Past Surgical History:  Procedure Laterality Date   ARTHRODESIS METATARSALPHALANGEAL JOINT (MTPJ) Right 08/13/2020   Procedure: FUSION RIGHT GREAT TOE METATARSALPHALANGEAL JOINT (MTPJ);  Surgeon: Newt Minion, MD;  Location: [place];  Service: Orthopedics;  Laterality: Right;   COLONOSCOPY  last one 02-25-2015   GANGLION CYST EXCISION Right 08/04/2021   Procedure: EXCISION MASS RIGHT MIDDLE FINGER;  Surgeon: Sherilyn Cooter, MD;  Location: [place];  Service: Orthopedics;  Laterality: Right;   HYDROCELE EXCISION Right 08/26/2015   Procedure: RIGHT HYDROCELECTOMY ADULT;  Surgeon: Franchot Gallo, MD;  Location: [facility];  Service: Urology;  Laterality: Right;   INGUINAL HERNIA REPAIR Right 10/29/2014   Procedure: LAPAROSCOPIC REPAIR  RECURRENT RIGHT INGUINAL ;  Surgeon: Fanny Skates, MD;  Location: [facility];  Service: General;  Laterality: Right;   INGUINAL HERNIA REPAIR Right 1984   INSERTION OF MESH Right 10/29/2014   Procedure: INSERTION OF MESH;  Surgeon: Fanny Skates, MD;  Location: [facility];  Service: General;  Laterality: Right;   INSERTION OF MESH N/A 06/17/2020   Procedure: INSERTION OF MESH;  Surgeon: Coralie Keens, MD;  Location: [place];  Service: General;  Laterality: N/A;   POLYPECTOMY     TOE OSTEOTOMY Bilateral right 2002/  left 2013   great toe   TOTAL HIP ARTHROPLASTY Left 09/22/2019   Procedure: LEFT TOTAL HIP ARTHROPLASTY ANTERIOR APPROACH;  Surgeon: Mcarthur Rossetti, MD;  Location: [facility];  Service: Orthopedics;  Laterality: Left;   UMBILICAL HERNIA REPAIR N/A 06/17/2020   Procedure: UMBILICAL HERNIA REPAIR;  Surgeon: Coralie Keens, MD;  Location: [place];  Service: General;  Laterality: N/A;    There were no vitals filed for this visit.   Subjective Assessment - 08/12/21 0904     Subjective relays he has not been that consistent with HEP lately. he wants to work on core and hip strength.    Pertinent History Elbow OA, L hip THA, hand OA, L first MTP fusion (R is next), previous hernia repair, HTN, hypothyroid    How long can you  sit comfortably? Sitting is most limited (10-15 minutes)    How long can you stand comfortably? Depends on what he is doing.  Very limited with flexion.    How long can you walk comfortably? Is walking short distances without pain.  Walking for exercise encouraged.    Patient Stated Goals Get back to normal activities without pain.    Pain Onset More than a month ago                               [facility] Adult PT Treatment/Exercise - 08/12/21 0001       Lumbar Exercises: Stretches   Single Knee to Chest Stretch Right;Left;2 reps;30 seconds    Figure 4 Stretch 2 reps;30 seconds    Other Lumbar Stretch  Exercise Standing lumbar/trunk extension (hip forward) 10X 3 seconds      Lumbar Exercises: Standing   Row Limitations blue X20 alternating unilat    Shoulder Extension Limitations blue X20 alternating unilat    Other Standing Lumbar Exercises sidestepping with green band 3 round trips, monster walks with green band 3 round trips    Other Standing Lumbar Exercises anti rotation walk outs blue band X10 bilat      Lumbar Exercises: Supine   AB Set Limitations in hooklying with green ball isometric hip flexion 5 sec X10 bilat, then isometric bilat shoulder ext 5 sec X10    Bridge 20 reps;5 seconds                       PT Short Term Goals - 07/24/21 1202       PT SHORT TERM GOAL #1   Title Improve trunk extension AROM to 10 degrees.    Baseline 5 degrees    Time 4    Period Weeks    Status New    Target Date 08/21/21      PT SHORT TERM GOAL #2   Title Improve hip flexion to 95 degrees bilaterally.    Baseline 85-90 degrees    Time 4    Period Weeks    Status New    Target Date 08/21/21               PT Long Term Goals - 07/24/21 1203       PT LONG TERM GOAL #1   Title Improve FOTO to 75.    Baseline 56    Time 8    Period Weeks    Status New    Target Date 09/18/21      PT LONG TERM GOAL #2   Title Timmothy Sours will report low back and R hip/gluteal pain consistently 0-2/10 on the Numeric Pain Rating Scale.    Baseline Can be 5-7/10 at worst    Time 8    Period Weeks    Status New    Target Date 09/18/21      PT LONG TERM GOAL #3   Title Improve trunk extension AROM to 15 degrees.    Baseline 5 degrees.    Time 8    Period Weeks    Status New    Target Date 09/18/21      PT LONG TERM GOAL #4   Title Improve B hip ER to 35-40 degrees.    Baseline 24-28 degrees    Time 8    Period Weeks    Status New    Target Date 09/18/21  PT LONG TERM GOAL #5   Title Timmothy Sours will be independent with his long-term maintenece HEP at [place].    Baseline Started  today    Time 8    Period Weeks    Status New    Target Date 09/18/21                   Plan - 08/12/21 0930     Clinical Impression Statement We worked to improve overall lumbar/core strength and hip strength today with good tolerance and no complaints of pain noted. We discussed holding off on returning to pilatees for 2 more weeks to continue to build his foundational core strength first. We also discussed being more consistent with his overall activity and exercise. Continue POC.s    Personal Factors and Comorbidities Comorbidity 3+    Comorbidities Elbow OA, L hip THA, hand OA, L first MTP fusion (R is next), previous hernia repair, HTN, hypothyroid    Examination-Activity Limitations Stand;Bed Mobility;Bend;Sit;Lift;Carry    Examination-Participation Restrictions Community Activity    Stability/Clinical Decision Making Stable/Uncomplicated    Rehab Potential Good    PT Frequency 2x / week    PT Duration 8 weeks    PT Treatment/Interventions ADLs/Self Care Home Management;Electrical Stimulation;Cryotherapy;Traction;Therapeutic activities;Neuromuscular re-education;Therapeutic exercise;Patient/family education;Manual techniques;Dry needling    PT Next Visit Plan postural and body mechanics practical activities, low back strengthening (extension emphasis); core/hip strengthening    PT Home Exercise Plan Access Code: [email or web address]    Consulted and Agree with Plan of Care Patient             Patient will benefit from skilled therapeutic intervention in order to improve the following deficits and impairments:  Decreased activity tolerance, Decreased endurance, Decreased range of motion, Difficulty walking, Decreased strength, Increased edema, Increased muscle spasms, Impaired flexibility, Impaired perceived functional ability, Improper body mechanics, Postural dysfunction, Pain  Visit Diagnosis: Abnormal posture  Difficulty walking  Muscle weakness (generalized)  Chronic  right-sided low back pain with right-sided sciatica     Problem List Patient Active Problem List   Diagnosis Date Noted   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 06/16/2021   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 06/16/2021   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 06/16/2021   Finger mass, right 06/16/2021   Hallux rigidus, right foot    Pain from implanted hardware    Hallux rigidus, left foot 07/30/2020   Pain in right shoulder 07/30/2020   Great toe pain, right 05/09/2020   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   Unilateral primary osteoarthritis, left hip 09/22/2019   Tendinitis, de Quervain's 06/02/2019   Pain of left hip joint 02/07/2019   Recurrent right inguinal hernia 10/29/2014   Hypertension 01/01/2012   Hypothyroidism 01/01/2012   History of adenomatous polyp of colon 01/01/2012    Debbe Odea, PT,DPT 08/12/2021, 9:37 AM  [facility] [address] [place], [place], [address] Phone: [phone]   Fax:  [phone]  Name: LEVIE WAGES MRN: [id number] Date of Birth: May 04, 1956

## 2021-08-14 ENCOUNTER — Other Ambulatory Visit: Payer: Self-pay

## 2021-08-14 ENCOUNTER — Ambulatory Visit (INDEPENDENT_AMBULATORY_CARE_PROVIDER_SITE_OTHER): Payer: BC Managed Care – PPO | Admitting: Rehabilitative and Restorative Service Providers"

## 2021-08-14 ENCOUNTER — Encounter: Payer: Self-pay | Admitting: Rehabilitative and Restorative Service Providers"

## 2021-08-14 DIAGNOSIS — M5441 Lumbago with sciatica, right side: Secondary | ICD-10-CM

## 2021-08-14 DIAGNOSIS — M6281 Muscle weakness (generalized): Secondary | ICD-10-CM

## 2021-08-14 DIAGNOSIS — R262 Difficulty in walking, not elsewhere classified: Secondary | ICD-10-CM | POA: Diagnosis not present

## 2021-08-14 DIAGNOSIS — R293 Abnormal posture: Secondary | ICD-10-CM

## 2021-08-14 DIAGNOSIS — G8929 Other chronic pain: Secondary | ICD-10-CM

## 2021-08-14 NOTE — Patient Instructions (Signed)
Access Code: [email or web address] URL: [email or web address] Date: 08/14/2021 Prepared by: Vista Mink  Exercises Standing Lumbar Extension at [place] - 5 x daily - 7 x weekly - 1 sets - 5 reps - 3 seconds hold Standing Scapular Retraction - 5 x daily - 7 x weekly - 1 sets - 5 reps - 5 second hold Single Knee to Chest Stretch - 2-3 x daily - 7 x weekly - 1 sets - 4-5 reps - 20 seconds hold Supine Figure 4 Piriformis Stretch - 2-3 x daily - 7 x weekly - 1 sets - 5 reps - 20 seconds hold Prone Hip Extension - 1 x daily - 7 x weekly - 2-3 sets - 10 reps - 3 seconds hold Prone Alternating Arm and Leg Lifts - 1 x daily - 7 x weekly - 2-3 sets - 10 reps - 3-10 seconds hold

## 2021-08-14 NOTE — Therapy (Signed)
[facility] Physical Therapy [address] [place], [place], [address] Phone: [phone]   Fax:  [phone]  Physical Therapy Treatment/Progress Note  Patient Details  Name: Robert Blair MRN: [id number] Date of Birth: 1955-11-10 Referring Provider (PT): Pete Pelt PA-C  Progress Note Reporting Period 07/24/2021 to 08/14/2021  See note below for Objective Data and Assessment of Progress/Goals.     Encounter Date: 08/14/2021   PT End of Session - 08/14/21 1344     Visit Number 4    Number of Visits 12    Date for PT Re-Evaluation 09/18/21    Progress Note Due on Visit 10    PT Start Time 0845    PT Stop Time 0928    PT Time Calculation (min) 43 min    Activity Tolerance Patient tolerated treatment well;No increased pain    Behavior During Therapy [facility] for tasks assessed/performed             Past Medical History:  Diagnosis Date   Arthritis    Cancer ([place]) 2016   Basal cell   Complication of anesthesia    URINARY RETENTION   Depression    History of adenomatous polyp of colon    2008/  2013/  2016   History of basal cell carcinoma excision    2015  nose   History of urinary retention    10-30-2014  POST OP   Hypothyroidism    Seasonal allergies    Unspecified essential hypertension     Past Surgical History:  Procedure Laterality Date   ARTHRODESIS METATARSALPHALANGEAL JOINT (MTPJ) Right 08/13/2020   Procedure: FUSION RIGHT GREAT TOE METATARSALPHALANGEAL JOINT (MTPJ);  Surgeon: Newt Minion, MD;  Location: [place];  Service: Orthopedics;  Laterality: Right;   COLONOSCOPY  last one 02-25-2015   GANGLION CYST EXCISION Right 08/04/2021   Procedure: EXCISION MASS RIGHT MIDDLE FINGER;  Surgeon: Sherilyn Cooter, MD;  Location: [place];  Service: Orthopedics;  Laterality: Right;   HYDROCELE EXCISION Right 08/26/2015   Procedure: RIGHT HYDROCELECTOMY ADULT;  Surgeon: Franchot Gallo, MD;   Location: [facility];  Service: Urology;  Laterality: Right;   INGUINAL HERNIA REPAIR Right 10/29/2014   Procedure: LAPAROSCOPIC REPAIR RECURRENT RIGHT INGUINAL ;  Surgeon: Fanny Skates, MD;  [facility];  Service: General;  Laterality: Right;   INGUINAL HERNIA REPAIR Right 1984   INSERTION OF MESH Right 10/29/2014   Procedure: INSERTION OF MESH;  Surgeon: Fanny Skates, MD;  [facility];  Service: General;  Laterality: Right;   INSERTION OF MESH N/A 06/17/2020   Procedure: INSERTION OF MESH;  Surgeon: Coralie Keens, MD;  Location: [place];  Service: General;  Laterality: N/A;   POLYPECTOMY     TOE OSTEOTOMY Bilateral right 2002/  left 2013   great toe   TOTAL HIP ARTHROPLASTY Left 09/22/2019   Procedure: LEFT TOTAL HIP ARTHROPLASTY ANTERIOR APPROACH;  Surgeon: Mcarthur Rossetti, MD;  [facility];  Service: Orthopedics;  Laterality: Left;   UMBILICAL HERNIA REPAIR N/A 06/17/2020   Procedure: UMBILICAL HERNIA REPAIR;  Surgeon: Coralie Keens, MD;  Location: [place];  Service: General;  Laterality: N/A;    There were no vitals filed for this visit.   Subjective Assessment - 08/14/21 1149     Subjective Don reports progress with mechanics and pain since starting PT.  Home program compliance is inconsistent and will benefit from improved compliance.    Pertinent History Elbow  OA, L hip THA, hand OA, L first MTP fusion (R is next), previous hernia repair, HTN, hypothyroid    How long can you sit comfortably? Sitting is most limited (10-15 minutes)    How long can you stand comfortably? Depends on what he is doing.  Very limited with flexion.    How long can you walk comfortably? Is walking short distances without pain.  Walking for exercise encouraged.    Patient Stated Goals Get back to normal activities without pain.    Currently in Pain? No/denies    Pain Score 0-No pain    Pain Location Hip    Pain Orientation  Right    Pain Descriptors / Indicators Aching;Tightness    Pain Type Chronic pain    Pain Radiating Towards Was to R foot    Pain Onset More than a month ago    Pain Frequency Occasional    Aggravating Factors  Prolonged sitting and flexion    Pain Relieving Factors Extension exercises and good posture/body mechanics    Effect of Pain on Daily Activities Has not fully returned to the gym, Pilates or golf    Multiple Pain Sites No                [facility] PT Assessment - 08/14/21 0001       ROM / Strength   AROM / PROM / Strength AROM;Strength      AROM   Overall AROM  Deficits    AROM Assessment Site Lumbar;Hip    Right/Left Hip Left;Right    Right Hip Flexion 95    Right Hip External Rotation  40    Right Hip Internal Rotation  5    Left Hip Flexion 85    Left Hip External Rotation  30    Left Hip Internal Rotation  8    Lumbar Extension 10      Strength   Overall Strength Comments 19 seconds spine strength test      Flexibility   Soft Tissue Assessment /Muscle Length yes    Hamstrings 50 degrees B                           [facility] Adult PT Treatment/Exercise - 08/14/21 0001       Posture/Postural Control   Posture/Postural Control Postural limitations    Postural Limitations Forward head;Rounded Shoulders;Decreased lumbar lordosis      Therapeutic Activites    Therapeutic Activities ADL's;Lifting    ADL's Practical golfers and diagonal lift    Other Therapeutic Activities Reviewed practical spine anatomy with spine model to clarify the importance of avoiding flexion with rotation, log roll practice, reviewed RA findings and updated HEP      Exercises   Exercises Lumbar      Lumbar Exercises: Stretches   Single Knee to Chest Stretch Right;Left;2 reps;20 seconds    Figure 4 Stretch 2 reps;20 seconds;Without overpressure    Other Lumbar Stretch Exercise Standing lumbar/trunk extension (hip forward) 10X 3 seconds      Lumbar Exercises: Standing    Scapular Retraction Strengthening;Both;10 reps;Limitations    Scapular Retraction Limitations Shoulder blade pinches 10X 5 seconds      Lumbar Exercises: Prone   Straight Leg Raise 10 reps;5 seconds    Opposite Arm/Leg Raise Left arm/Right leg;Right arm/Left leg;10 reps;3 seconds                     PT Education -  08/14/21 1340     Education Details Reviewed spine anatomy, body mechanics, HEP (with progressions) and RA findings.    Person(s) Educated Patient    Methods Explanation;Demonstration;Tactile cues;Verbal cues;Handout    Comprehension Verbalized understanding;Tactile cues required;Need further instruction;Returned demonstration;Verbal cues required              PT Short Term Goals - 08/14/21 1340       PT SHORT TERM GOAL #1   Title Improve trunk extension AROM to 10 degrees.    Baseline 5 degrees at evaluation    Time 4    Period Weeks    Status Achieved    Target Date 08/21/21      PT SHORT TERM GOAL #2   Title Improve hip flexion to 95 degrees bilaterally.    Baseline 85-90 degrees at evaluation    Time 4    Period Weeks    Status Partially Met    Target Date 08/21/21               PT Long Term Goals - 08/14/21 1341       PT LONG TERM GOAL #1   Title Improve FOTO to 75.    Baseline 91 (56 at evaluation)    Time 8    Period Weeks    Status Achieved      PT LONG TERM GOAL #2   Title Timmothy Sours will report low back and R hip/gluteal pain consistently 0-2/10 on the Numeric Pain Rating Scale.    Baseline 0-3/10 (was 5-7/10 at worst)    Time 8    Period Weeks    Status Partially Met    Target Date 09/18/21      PT LONG TERM GOAL #3   Title Improve trunk extension AROM to 15 degrees.    Baseline 10 (was 5) degrees.    Time 8    Period Weeks    Status On-going    Target Date 09/18/21      PT LONG TERM GOAL #4   Title Improve B hip ER to 35-40 degrees.    Baseline 30-40 (was 24-28) degrees    Time 8    Period Weeks    Status  On-going    Target Date 09/18/21      PT LONG TERM GOAL #5   Title Timmothy Sours will be independent with his long-term maintenece HEP at [place].    Baseline Started today    Time 8    Period Weeks    Status On-going    Target Date 09/18/21                   Plan - 08/14/21 1344     Clinical Impression Statement Timmothy Sours is making excellent early functional progress towards long-term goals (see FOTO).  Pain is better but he is still apprehensive about returning to full function (Pilates, golf, etc).  Objective AROM and strength impairments are present and will benefit from progressing low back and hip abductors strength for smooth return to full function.    Personal Factors and Comorbidities Comorbidity 3+    Comorbidities Elbow OA, L hip THA, hand OA, L first MTP fusion (R is next), previous hernia repair, HTN, hypothyroid    Examination-Activity Limitations Stand;Bed Mobility;Bend;Sit;Lift;Carry    Examination-Participation Restrictions Community Activity    Stability/Clinical Decision Making Stable/Uncomplicated    Rehab Potential Good    PT Frequency 2x / week    PT Duration 4 weeks    PT Treatment/Interventions  ADLs/Self Care Home Management;Electrical Stimulation;Cryotherapy;Traction;Therapeutic activities;Neuromuscular re-education;Therapeutic exercise;Patient/family education;Manual techniques;Dry needling    PT Next Visit Plan Progress low back and hip abductors strength for return to full function    PT Home Exercise Plan Access Code: [email or web address]    Consulted and Agree with Plan of Care Patient             Patient will benefit from skilled therapeutic intervention in order to improve the following deficits and impairments:  Decreased activity tolerance, Decreased endurance, Decreased range of motion, Difficulty walking, Decreased strength, Increased edema, Increased muscle spasms, Impaired flexibility, Impaired perceived functional ability, Improper body mechanics, Postural  dysfunction, Pain  Visit Diagnosis: Abnormal posture  Difficulty walking  Muscle weakness (generalized)  Chronic right-sided low back pain with right-sided sciatica     Problem List Patient Active Problem List   Diagnosis Date Noted   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 06/16/2021   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 06/16/2021   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 06/16/2021   Finger mass, right 06/16/2021   Hallux rigidus, right foot    Pain from implanted hardware    Hallux rigidus, left foot 07/30/2020   Pain in right shoulder 07/30/2020   Great toe pain, right 05/09/2020   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   Unilateral primary osteoarthritis, left hip 09/22/2019   Tendinitis, de Quervain's 06/02/2019   Pain of left hip joint 02/07/2019   Recurrent right inguinal hernia 10/29/2014   Hypertension 01/01/2012   Hypothyroidism 01/01/2012   History of adenomatous polyp of colon 01/01/2012    Farley Ly, PT, MPT 08/14/2021, 1:47 PM  [facility] Physical Therapy [address] [place], [place], [address] Phone: [phone]   Fax:  [phone]  Name: ZEPH RIEBEL MRN: [id number] Date of Birth: 06-24-1956

## 2021-08-15 ENCOUNTER — Encounter: Payer: Self-pay | Admitting: Orthopedic Surgery

## 2021-08-15 ENCOUNTER — Ambulatory Visit (INDEPENDENT_AMBULATORY_CARE_PROVIDER_SITE_OTHER): Payer: BC Managed Care – PPO | Admitting: Orthopedic Surgery

## 2021-08-15 DIAGNOSIS — R2231 Localized swelling, mass and lump, right upper limb: Secondary | ICD-10-CM

## 2021-08-15 MED ORDER — MELOXICAM 7.5 MG PO TABS: 7.5000 mg | ORAL_TABLET | Freq: Every day | ORAL | 1 refills | Status: DC

## 2021-08-15 NOTE — Progress Notes (Signed)
Post-Op Visit Note   Patient: Robert Blair           Date of Birth: 1956-09-02           MRN: [id number] Visit Date: 08/15/2021 PCP: [name], MD   Assessment & Plan:  Chief Complaint:  Chief Complaint  Patient presents with   Right Middle Finger - Post-op Follow-up   Visit Diagnoses:  1. Finger mass, right     Plan: Patient doing well postoperatively.  Incision clean, dry, and well approximated.  Sutures removed today.  Still with some numbness at the ulnar aspect of the middle finger tip.  Also having pain at index MP joint.  Pain worse w/ activity but starting to bother him at night.  Radiographs notable for osteoarthritis in this joint.  Discussed nonoperative treatment options.  Will prescribe meloxicam to be taken as needed for symptoms.  I can see him back again as needed.   Follow-Up Instructions: No follow-ups on file.   Orders:  No orders of the defined types were placed in this encounter.  No orders of the defined types were placed in this encounter.   Imaging: No results found.  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   Unilateral primary osteoarthritis, left hip 09/22/2019   Tendinitis, de Quervain's 06/02/2019   Pain of left hip joint 02/07/2019   Recurrent right inguinal hernia 10/29/2014   Hypertension 01/01/2012   Hypothyroidism 01/01/2012   History of adenomatous polyp of colon 01/01/2012    Past Medical History:  Diagnosis Date   Arthritis    Cancer ([place]) 2016   Basal cell   Complication of anesthesia    URINARY RETENTION   Depression    History of adenomatous polyp of colon    2008/  2013/  2016   History of basal cell carcinoma excision    2015  nose   History of urinary retention    10-30-2014  POST OP   Hypothyroidism    Seasonal allergies    Unspecified essential hypertension     Family History  Problem Relation Age of Onset   Breast cancer Mother    Hypertension Mother    Depression Mother    Cancer - Other Mother        unknown GI cancer   Colon polyps Father    Stroke Father    Alzheimer's disease Father    Colon cancer Neg Hx    Rectal cancer Neg Hx    Stomach cancer Neg Hx    Liver disease Neg Hx     Past Surgical History:  Procedure Laterality Date   ARTHRODESIS METATARSALPHALANGEAL JOINT (MTPJ) Right 08/13/2020   Procedure: FUSION RIGHT GREAT TOE METATARSALPHALANGEAL JOINT (MTPJ);  Surgeon: Newt Minion, MD;  Location: [place];  Service: Orthopedics;  Laterality: Right;   COLONOSCOPY  last one 02-25-2015   GANGLION CYST EXCISION Right 08/04/2021   Procedure: EXCISION MASS RIGHT MIDDLE  FINGER;  Surgeon: Sherilyn Cooter, MD;  Location: [place];  Service: Orthopedics;  Laterality: Right;   HYDROCELE EXCISION Right 08/26/2015   Procedure: RIGHT HYDROCELECTOMY ADULT;  Surgeon: Franchot Gallo, MD;  Location: [facility];  Service: Urology;  Laterality: Right;   INGUINAL HERNIA REPAIR Right 10/29/2014   Procedure: LAPAROSCOPIC REPAIR RECURRENT RIGHT INGUINAL ;  Surgeon: Fanny Skates, MD;  [facility];  Service: General;  Laterality: Right;   INGUINAL HERNIA REPAIR Right 1984   INSERTION OF MESH Right 10/29/2014   Procedure: INSERTION OF MESH;  Surgeon: Fanny Skates, MD;  [facility];  Service: General;  Laterality: Right;   INSERTION OF MESH N/A 06/17/2020   Procedure: INSERTION  OF MESH;  Surgeon: Coralie Keens, MD;  Location: [place];  Service: General;  Laterality: N/A;   POLYPECTOMY     TOE OSTEOTOMY Bilateral right 2002/  left 2013   great toe   TOTAL HIP ARTHROPLASTY Left 09/22/2019   Procedure: LEFT TOTAL HIP ARTHROPLASTY ANTERIOR APPROACH;  Surgeon: Mcarthur Rossetti, MD;  [facility];  Service: Orthopedics;  Laterality: Left;   UMBILICAL HERNIA REPAIR N/A 06/17/2020   Procedure: UMBILICAL HERNIA REPAIR;  Surgeon: Coralie Keens, MD;  Location: [place];  Service: General;  Laterality: N/A;   Social History   Occupational History   Occupation: self employed  Tobacco Use   Smoking status: Never   Smokeless tobacco: Never  Vaping Use   Vaping Use: Never used  Substance and Sexual Activity   Alcohol use: Yes    Comment: social   Drug use: No   Sexual activity: Yes

## 2021-08-19 ENCOUNTER — Encounter: Payer: Self-pay | Admitting: Physical Therapy

## 2021-08-19 ENCOUNTER — Other Ambulatory Visit: Payer: Self-pay

## 2021-08-19 ENCOUNTER — Ambulatory Visit (INDEPENDENT_AMBULATORY_CARE_PROVIDER_SITE_OTHER): Payer: BC Managed Care – PPO | Admitting: Physical Therapy

## 2021-08-19 DIAGNOSIS — M5441 Lumbago with sciatica, right side: Secondary | ICD-10-CM

## 2021-08-19 DIAGNOSIS — R262 Difficulty in walking, not elsewhere classified: Secondary | ICD-10-CM | POA: Diagnosis not present

## 2021-08-19 DIAGNOSIS — R293 Abnormal posture: Secondary | ICD-10-CM | POA: Diagnosis not present

## 2021-08-19 DIAGNOSIS — G8929 Other chronic pain: Secondary | ICD-10-CM

## 2021-08-19 DIAGNOSIS — M6281 Muscle weakness (generalized): Secondary | ICD-10-CM | POA: Diagnosis not present

## 2021-08-19 NOTE — Therapy (Signed)
[facility] Physical Therapy [address] [place], [place], [address] Phone: [phone]   Fax:  [phone]  Physical Therapy Treatment  Patient Details  Name: Robert Blair MRN: [id number] Date of Birth: Oct 23, 1955 Referring Provider (PT): Pete Pelt PA-C   Encounter Date: 08/19/2021   PT End of Session - 08/19/21 0923     Visit Number 5    Number of Visits 12    Date for PT Re-Evaluation 09/18/21    Progress Note Due on Visit 10    PT Start Time 0846    PT Stop Time 0925    PT Time Calculation (min) 39 min    Activity Tolerance Patient tolerated treatment well;No increased pain    Behavior During Therapy [facility] for tasks assessed/performed             Past Medical History:  Diagnosis Date   Arthritis    Cancer ([place]) 2016   Basal cell   Complication of anesthesia    URINARY RETENTION   Depression    History of adenomatous polyp of colon    2008/  2013/  2016   History of basal cell carcinoma excision    2015  nose   History of urinary retention    10-30-2014  POST OP   Hypothyroidism    Seasonal allergies    Unspecified essential hypertension     Past Surgical History:  Procedure Laterality Date   ARTHRODESIS METATARSALPHALANGEAL JOINT (MTPJ) Right 08/13/2020   Procedure: FUSION RIGHT GREAT TOE METATARSALPHALANGEAL JOINT (MTPJ);  Surgeon: Newt Minion, MD;  Location: [place];  Service: Orthopedics;  Laterality: Right;   COLONOSCOPY  last one 02-25-2015   GANGLION CYST EXCISION Right 08/04/2021   Procedure: EXCISION MASS RIGHT MIDDLE FINGER;  Surgeon: Sherilyn Cooter, MD;  Location: [place];  Service: Orthopedics;  Laterality: Right;   HYDROCELE EXCISION Right 08/26/2015   Procedure: RIGHT HYDROCELECTOMY ADULT;  Surgeon: Franchot Gallo, MD;  Location: [facility];  Service: Urology;  Laterality: Right;   INGUINAL HERNIA REPAIR Right 10/29/2014   Procedure: LAPAROSCOPIC REPAIR  RECURRENT RIGHT INGUINAL ;  Surgeon: Fanny Skates, MD;  [facility];  Service: General;  Laterality: Right;   INGUINAL HERNIA REPAIR Right 1984   INSERTION OF MESH Right 10/29/2014   Procedure: INSERTION OF MESH;  Surgeon: Fanny Skates, MD;  [facility];  Service: General;  Laterality: Right;   INSERTION OF MESH N/A 06/17/2020   Procedure: INSERTION OF MESH;  Surgeon: Coralie Keens, MD;  Location: [place];  Service: General;  Laterality: N/A;   POLYPECTOMY     TOE OSTEOTOMY Bilateral right 2002/  left 2013   great toe   TOTAL HIP ARTHROPLASTY Left 09/22/2019   Procedure: LEFT TOTAL HIP ARTHROPLASTY ANTERIOR APPROACH;  Surgeon: Mcarthur Rossetti, MD;  [facility];  Service: Orthopedics;  Laterality: Left;   UMBILICAL HERNIA REPAIR N/A 06/17/2020   Procedure: UMBILICAL HERNIA REPAIR;  Surgeon: Coralie Keens, MD;  Location: [place];  Service: General;  Laterality: N/A;    There were no vitals filed for this visit.   Subjective Assessment - 08/19/21 0845     Subjective doing well    Pertinent History Elbow OA, L hip THA, hand OA, L first MTP fusion (R is next), previous hernia repair, HTN, hypothyroid    How long can you sit comfortably? Sitting is most limited (10-15 minutes)    How long can you stand comfortably?  Depends on what he is doing.  Very limited with flexion.    How long can you walk comfortably? Is walking short distances without pain.  Walking for exercise encouraged.    Patient Stated Goals Get back to normal activities without pain.    Currently in Pain? No/denies    Pain Onset More than a month ago                               [facility] Adult PT Treatment/Exercise - 08/19/21 0850       Lumbar Exercises: Stretches   Other Lumbar Stretch Exercise Standing lumbar/trunk extension (hip forward) 10X 5-10 sec seconds      Lumbar Exercises: Standing   Row Both;20 reps;Theraband    Theraband  Level (Row) Level 4 (Blue)    Row Limitations 2x10 with L4 band from overhead anchor    Other Standing Lumbar Exercises hip hike 10 x 5 sec hold    Other Standing Lumbar Exercises RDL 20# 3x10      Lumbar Exercises: Prone   Straight Leg Raise 10 reps;3 seconds   2 sets   Opposite Arm/Leg Raise Left arm/Right leg;Right arm/Left leg;10 reps;3 seconds   2 sets   Other Prone Lumbar Exercises Prone plank 3 x 30 sec                       PT Short Term Goals - 08/14/21 1340       PT SHORT TERM GOAL #1   Title Improve trunk extension AROM to 10 degrees.    Baseline 5 degrees at evaluation    Time 4    Period Weeks    Status Achieved    Target Date 08/21/21      PT SHORT TERM GOAL #2   Title Improve hip flexion to 95 degrees bilaterally.    Baseline 85-90 degrees at evaluation    Time 4    Period Weeks    Status Partially Met    Target Date 08/21/21               PT Long Term Goals - 08/14/21 1341       PT LONG TERM GOAL #1   Title Improve FOTO to 75.    Baseline 91 (56 at evaluation)    Time 8    Period Weeks    Status Achieved      PT LONG TERM GOAL #2   Title Robert Blair will report low back and R hip/gluteal pain consistently 0-2/10 on the Numeric Pain Rating Scale.    Baseline 0-3/10 (was 5-7/10 at worst)    Time 8    Period Weeks    Status Partially Met    Target Date 09/18/21      PT LONG TERM GOAL #3   Title Improve trunk extension AROM to 15 degrees.    Baseline 10 (was 5) degrees.    Time 8    Period Weeks    Status On-going    Target Date 09/18/21      PT LONG TERM GOAL #4   Title Improve B hip ER to 35-40 degrees.    Baseline 30-40 (was 24-28) degrees    Time 8    Period Weeks    Status On-going    Target Date 09/18/21      PT LONG TERM GOAL #5   Title Robert Blair will be independent with his long-term maintenece  HEP at [place].    Baseline Started today    Time 8    Period Weeks    Status On-going    Target Date 09/18/21                    Plan - 08/19/21 0926     Clinical Impression Statement Pt tolerated session well today without c/o pain.  Will continue to benefit from PT to maximize function.  Anticipate we may be nearing d/c.    Personal Factors and Comorbidities Comorbidity 3+    Comorbidities Elbow OA, L hip THA, hand OA, L first MTP fusion (R is next), previous hernia repair, HTN, hypothyroid    Examination-Activity Limitations Stand;Bed Mobility;Bend;Sit;Lift;Carry    Examination-Participation Restrictions Community Activity    Stability/Clinical Decision Making Stable/Uncomplicated    Rehab Potential Good    PT Frequency 2x / week    PT Duration 4 weeks    PT Treatment/Interventions ADLs/Self Care Home Management;Electrical Stimulation;Cryotherapy;Traction;Therapeutic activities;Neuromuscular re-education;Therapeutic exercise;Patient/family education;Manual techniques;Dry needling    PT Next Visit Plan Progress low back and hip abductors strength for return to full function, discuss POC and possible transition to community fitness    PT Home Exercise Plan Access Code: [email or web address]    Consulted and Agree with Plan of Care Patient             Patient will benefit from skilled therapeutic intervention in order to improve the following deficits and impairments:  Decreased activity tolerance, Decreased endurance, Decreased range of motion, Difficulty walking, Decreased strength, Increased edema, Increased muscle spasms, Impaired flexibility, Impaired perceived functional ability, Improper body mechanics, Postural dysfunction, Pain  Visit Diagnosis: Abnormal posture  Difficulty walking  Muscle weakness (generalized)  Chronic right-sided low back pain with right-sided sciatica     Problem List Patient Active Problem List   Diagnosis Date Noted   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 06/16/2021   Degenerative arthritis of metacarpophalangeal joint of  middle finger of right hand 06/16/2021   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 06/16/2021   Finger mass, right 06/16/2021   Hallux rigidus, right foot    Pain from implanted hardware    Hallux rigidus, left foot 07/30/2020   Pain in right shoulder 07/30/2020   Great toe pain, right 05/09/2020   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   Unilateral primary osteoarthritis, left hip 09/22/2019   Tendinitis, de Quervain's 06/02/2019   Pain of left hip joint 02/07/2019   Recurrent right inguinal hernia 10/29/2014   Hypertension 01/01/2012   Hypothyroidism 01/01/2012   History of adenomatous polyp of colon 01/01/2012      Laureen Abrahams, PT, DPT 08/19/21 9:28 AM     [facility] Physical Therapy [address] [place], [place], [address] Phone: [phone]   Fax:  [phone]  Name: Robert Blair MRN: [id number] Date of Birth: 1956-01-03

## 2021-08-21 ENCOUNTER — Ambulatory Visit (INDEPENDENT_AMBULATORY_CARE_PROVIDER_SITE_OTHER): Payer: BC Managed Care – PPO | Admitting: Rehabilitative and Restorative Service Providers"

## 2021-08-21 ENCOUNTER — Encounter: Payer: Self-pay | Admitting: Rehabilitative and Restorative Service Providers"

## 2021-08-21 ENCOUNTER — Other Ambulatory Visit: Payer: Self-pay

## 2021-08-21 DIAGNOSIS — M5441 Lumbago with sciatica, right side: Secondary | ICD-10-CM | POA: Diagnosis not present

## 2021-08-21 DIAGNOSIS — G8929 Other chronic pain: Secondary | ICD-10-CM

## 2021-08-21 DIAGNOSIS — R262 Difficulty in walking, not elsewhere classified: Secondary | ICD-10-CM

## 2021-08-21 DIAGNOSIS — M6281 Muscle weakness (generalized): Secondary | ICD-10-CM

## 2021-08-21 DIAGNOSIS — R293 Abnormal posture: Secondary | ICD-10-CM

## 2021-08-21 NOTE — Therapy (Signed)
[facility] [address] [place], [place], [address] Phone: [phone]   Fax:  [phone]  Physical Therapy Treatment  Patient Details  Name: Robert Blair MRN: [id number] Date of Birth: 11-15-1955 Referring Provider (PT): Pete Pelt PA-C   Encounter Date: 08/21/2021   PT End of Session - 08/21/21 0937     Visit Number 6    Number of Visits 12    Date for PT Re-Evaluation 09/18/21    Progress Note Due on Visit 10    PT Start Time 0845    PT Stop Time 0930    PT Time Calculation (min) 45 min    Activity Tolerance Patient tolerated treatment well;No increased pain    Behavior During Therapy [facility] for tasks assessed/performed             Past Medical History:  Diagnosis Date   Arthritis    Cancer ([place]) 2016   Basal cell   Complication of anesthesia    URINARY RETENTION   Depression    History of adenomatous polyp of colon    2008/  2013/  2016   History of basal cell carcinoma excision    2015  nose   History of urinary retention    10-30-2014  POST OP   Hypothyroidism    Seasonal allergies    Unspecified essential hypertension     Past Surgical History:  Procedure Laterality Date   ARTHRODESIS METATARSALPHALANGEAL JOINT (MTPJ) Right 08/13/2020   Procedure: FUSION RIGHT GREAT TOE METATARSALPHALANGEAL JOINT (MTPJ);  Surgeon: Newt Minion, MD;  Location: [place];  Service: Orthopedics;  Laterality: Right;   COLONOSCOPY  last one 02-25-2015   GANGLION CYST EXCISION Right 08/04/2021   Procedure: EXCISION MASS RIGHT MIDDLE FINGER;  Surgeon: Sherilyn Cooter, MD;  Location: [place];  Service: Orthopedics;  Laterality: Right;   HYDROCELE EXCISION Right 08/26/2015   Procedure: RIGHT HYDROCELECTOMY ADULT;  Surgeon: Franchot Gallo, MD;  Location: [facility];  Service: Urology;  Laterality: Right;   INGUINAL HERNIA REPAIR Right 10/29/2014   Procedure: LAPAROSCOPIC REPAIR  RECURRENT RIGHT INGUINAL ;  Surgeon: Fanny Skates, MD;  [facility];  Service: General;  Laterality: Right;   INGUINAL HERNIA REPAIR Right 1984   INSERTION OF MESH Right 10/29/2014   Procedure: INSERTION OF MESH;  Surgeon: Fanny Skates, MD;  Location[facility];  Service: General;  Laterality: Right;   INSERTION OF MESH N/A 06/17/2020   Procedure: INSERTION OF MESH;  Surgeon: Coralie Keens, MD;  Location: [place];  Service: General;  Laterality: N/A;   POLYPECTOMY     TOE OSTEOTOMY Bilateral right 2002/  left 2013   great toe   TOTAL HIP ARTHROPLASTY Left 09/22/2019   Procedure: LEFT TOTAL HIP ARTHROPLASTY ANTERIOR APPROACH;  Surgeon: Mcarthur Rossetti, MD;  [facility];  Service: Orthopedics;  Laterality: Left;   UMBILICAL HERNIA REPAIR N/A 06/17/2020   Procedure: UMBILICAL HERNIA REPAIR;  Surgeon: Coralie Keens, MD;  Location: [place];  Service: General;  Laterality: N/A;    There were no vitals filed for this visit.   Subjective Assessment - 08/21/21 0858     Subjective Don reports he is most interested in getting corrective feedback with his HEP.  He is feeling better but feels as if he makes mistakes with his HEP mechanics.    Pertinent History Elbow OA, L hip THA, hand OA, L first MTP fusion (R is next), previous hernia  repair, HTN, hypothyroid    How long can you sit comfortably? Up to 2 hours (was 10-15 minutes)    How long can you stand comfortably? As long as he needs to.    How long can you walk comfortably? Is walking short distances without pain.  Walking for exercise encouraged.    Patient Stated Goals Get back to normal activities without pain.    Currently in Pain? No/denies    Pain Score 0-No pain    Pain Orientation Right    Pain Descriptors / Indicators Tightness    Pain Type Chronic pain    Pain Radiating Towards Was to R foot    Pain Onset More than a month ago    Pain Frequency Rarely    Aggravating Factors   Prolonged sitting and flexion    Pain Relieving Factors Extension exercises and good posture/body mechanics    Effect of Pain on Daily Activities Has not returned to normal activities (Pilates, gym, golf)    Multiple Pain Sites No                               [facility] Adult PT Treatment/Exercise - 08/21/21 0001       Posture/Postural Control   Posture/Postural Control Postural limitations    Postural Limitations Forward head;Rounded Shoulders;Decreased lumbar lordosis      Therapeutic Activites    Therapeutic Activities ADL's;Lifting    ADL's Practical golfers and diagonal lift    Other Therapeutic Activities Reviewed and corrected log roll, reviewed golfer's and diagonal squat lift, discussed activities in the gym including recumbent bike      Exercises   Exercises Lumbar      Lumbar Exercises: Stretches   Single Knee to Chest Stretch Right;Left;2 reps;20 seconds    Single Knee to Chest Stretch Limitations 2X knee to chest; 2X knee to chest with leg off table; 2X standing    Figure 4 Stretch 2 reps;20 seconds;Without overpressure    Other Lumbar Stretch Exercise Standing lumbar/trunk extension (hip forward) 10X 3 seconds      Lumbar Exercises: Standing   Scapular Retraction Strengthening;Both;10 reps;Limitations    Scapular Retraction Limitations Shoulder blade pinches 10X 5 seconds    Row Both;20 reps;Other (comment)    Theraband Level (Row) --    Row Limitations 55# 20X limited range (neutral to retraction, avoid rounding shoulders)    Other Standing Lumbar Exercises Hip hike shoulder against door frame 2 sets of 10 shoulder against wall to avoid lateral lean      Lumbar Exercises: Prone   Straight Leg Raise 10 reps;5 seconds    Straight Leg Raises Limitations Toes pulled back, forehead on forearms    Opposite Arm/Leg Raise Left arm/Right leg;Right arm/Left leg;10 reps;3 seconds    Other Prone Lumbar Exercises Prone superman (B arm and leg extensions)  palms in, toes back, elbows in by ears, head in neutral 5X 5 seconds                     PT Education - 08/21/21 0936     Education Details Reviewed body mechanics, HEP with corrections and updates (standing hip flexors stretch and prone "superman")    Person(s) Educated Patient    Methods Explanation;Demonstration;Tactile cues;Verbal cues;Handout    Comprehension Verbalized understanding;Tactile cues required;Returned demonstration;Need further instruction;Verbal cues required              PT Short Term Goals -  08/14/21 1340       PT SHORT TERM GOAL #1   Title Improve trunk extension AROM to 10 degrees.    Baseline 5 degrees at evaluation    Time 4    Period Weeks    Status Achieved    Target Date 08/21/21      PT SHORT TERM GOAL #2   Title Improve hip flexion to 95 degrees bilaterally.    Baseline 85-90 degrees at evaluation    Time 4    Period Weeks    Status Partially Met    Target Date 08/21/21               PT Long Term Goals - 08/14/21 1341       PT LONG TERM GOAL #1   Title Improve FOTO to 75.    Baseline 91 (56 at evaluation)    Time 8    Period Weeks    Status Achieved      PT LONG TERM GOAL #2   Title Robert Blair will report low back and R hip/gluteal pain consistently 0-2/10 on the Numeric Pain Rating Scale.    Baseline 0-3/10 (was 5-7/10 at worst)    Time 8    Period Weeks    Status Partially Met    Target Date 09/18/21      PT LONG TERM GOAL #3   Title Improve trunk extension AROM to 15 degrees.    Baseline 10 (was 5) degrees.    Time 8    Period Weeks    Status On-going    Target Date 09/18/21      PT LONG TERM GOAL #4   Title Improve B hip ER to 35-40 degrees.    Baseline 30-40 (was 24-28) degrees    Time 8    Period Weeks    Status On-going    Target Date 09/18/21      PT LONG TERM GOAL #5   Title Robert Blair will be independent with his long-term maintenece HEP at [place].    Baseline Started today    Time 8    Period Weeks     Status On-going    Target Date 09/18/21                   Plan - 08/21/21 0263     Clinical Impression Statement Robert Blair did require corrective feedback and he will benefit from continued supervision to prepare him for transfer into independent rehabilitation.  Fatigue is noted with strength activities (hip hike and prone activities).  Robert Blair should be ready for gradual return to activity and transfer into independent rehabilitation in 6 visits or less.    Personal Factors and Comorbidities Comorbidity 3+    Comorbidities Elbow OA, L hip THA, hand OA, L first MTP fusion (R is next), previous hernia repair, HTN, hypothyroid    Examination-Activity Limitations Stand;Bed Mobility;Bend;Sit;Lift;Carry    Examination-Participation Restrictions Community Activity    Stability/Clinical Decision Making Stable/Uncomplicated    Rehab Potential Good    PT Frequency 2x / week    PT Duration 4 weeks    PT Treatment/Interventions ADLs/Self Care Home Management;Electrical Stimulation;Cryotherapy;Traction;Therapeutic activities;Neuromuscular re-education;Therapeutic exercise;Patient/family education;Manual techniques;Dry needling    PT Next Visit Plan Needs spine, hip abductor and general LE strength progressions, review body mechanics with corrections PRN.    PT Home Exercise Plan Access Code: [email or web address]    Consulted and Agree with Plan of Care Patient  Patient will benefit from skilled therapeutic intervention in order to improve the following deficits and impairments:  Decreased activity tolerance, Decreased endurance, Decreased range of motion, Difficulty walking, Decreased strength, Increased edema, Increased muscle spasms, Impaired flexibility, Impaired perceived functional ability, Improper body mechanics, Postural dysfunction, Pain  Visit Diagnosis: Abnormal posture  Difficulty walking  Muscle weakness (generalized)  Chronic right-sided low back pain with right-sided  sciatica     Problem List Patient Active Problem List   Diagnosis Date Noted   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 06/16/2021   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 06/16/2021   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 06/16/2021   Finger mass, right 06/16/2021   Hallux rigidus, right foot    Pain from implanted hardware    Hallux rigidus, left foot 07/30/2020   Pain in right shoulder 07/30/2020   Great toe pain, right 05/09/2020   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   Unilateral primary osteoarthritis, left hip 09/22/2019   Tendinitis, de Quervain's 06/02/2019   Pain of left hip joint 02/07/2019   Recurrent right inguinal hernia 10/29/2014   Hypertension 01/01/2012   Hypothyroidism 01/01/2012   History of adenomatous polyp of colon 01/01/2012    Farley Ly, PT, MPT 08/21/2021, 9:42 AM  [facility] Physical Therapy [address] [place], [place], [address] Phone: [phone]   Fax:  [phone]  Name: ORLONDO HOLYCROSS MRN: [id number] Date of Birth: 09-Apr-1956

## 2021-08-21 NOTE — Patient Instructions (Signed)
Access Code: [email or web address] URL: [email or web address] Date: 08/21/2021 Prepared by: Vista Mink  Exercises Standing Lumbar Extension at [place] - 5 x daily - 7 x weekly - 1 sets - 5 reps - 3 seconds hold Standing Scapular Retraction - 5 x daily - 7 x weekly - 1 sets - 5 reps - 5 second hold Single Knee to Chest Stretch - 2-3 x daily - 7 x weekly - 1 sets - 4-5 reps - 20 seconds hold Supine Figure 4 Piriformis Stretch - 2-3 x daily - 7 x weekly - 1 sets - 5 reps - 20 seconds hold Prone Hip Extension - 1 x daily - 7 x weekly - 2-3 sets - 10 reps - 3 seconds hold Prone Alternating Arm and Leg Lifts - 1 x daily - 7 x weekly - 2-3 sets - 10 reps - 3-10 seconds hold Full Superman on Table - 1 x daily - 7 x weekly - 1 sets - 5-10 reps - 5 seconds hold

## 2021-08-26 ENCOUNTER — Encounter: Payer: Self-pay | Admitting: Physical Therapy

## 2021-08-26 ENCOUNTER — Ambulatory Visit (INDEPENDENT_AMBULATORY_CARE_PROVIDER_SITE_OTHER): Payer: BC Managed Care – PPO | Admitting: Physical Therapy

## 2021-08-26 ENCOUNTER — Other Ambulatory Visit: Payer: Self-pay

## 2021-08-26 DIAGNOSIS — R293 Abnormal posture: Secondary | ICD-10-CM

## 2021-08-26 DIAGNOSIS — R262 Difficulty in walking, not elsewhere classified: Secondary | ICD-10-CM | POA: Diagnosis not present

## 2021-08-26 DIAGNOSIS — M5441 Lumbago with sciatica, right side: Secondary | ICD-10-CM | POA: Diagnosis not present

## 2021-08-26 DIAGNOSIS — G8929 Other chronic pain: Secondary | ICD-10-CM

## 2021-08-26 DIAGNOSIS — M6281 Muscle weakness (generalized): Secondary | ICD-10-CM

## 2021-08-26 NOTE — Therapy (Signed)
[facility] Physical Therapy [address] [place], [place], [address] Phone: [phone]   Fax:  [phone]  Physical Therapy Treatment  Patient Details  Name: Robert Blair MRN: [id number] Date of Birth: [birth year] Referring Provider (PT): Pete Pelt PA-C   Encounter Date: 08/26/2021   PT End of Session - 08/26/21 0923     Visit Number 7    Number of Visits 12    Date for PT Re-Evaluation 09/18/21    Progress Note Due on Visit 10    PT Start Time 0846    PT Stop Time 0924    PT Time Calculation (min) 38 min    Activity Tolerance Patient tolerated treatment well;No increased pain    Behavior During Therapy [facility] for tasks assessed/performed             Past Medical History:  Diagnosis Date   Arthritis    Cancer ([place]) 2016   Basal cell   Complication of anesthesia    URINARY RETENTION   Depression    History of adenomatous polyp of colon    2008/  2013/  2016   History of basal cell carcinoma excision    2015  nose   History of urinary retention    10-30-2014  POST OP   Hypothyroidism    Seasonal allergies    Unspecified essential hypertension     Past Surgical History:  Procedure Laterality Date   ARTHRODESIS METATARSALPHALANGEAL JOINT (MTPJ) Right 08/13/2020   Procedure: FUSION RIGHT GREAT TOE METATARSALPHALANGEAL JOINT (MTPJ);  Surgeon: Newt Minion, MD;  Location: [place];  Service: Orthopedics;  Laterality: Right;   COLONOSCOPY  last one 02-25-2015   GANGLION CYST EXCISION Right 08/04/2021   Procedure: EXCISION MASS RIGHT MIDDLE FINGER;  Surgeon: Sherilyn Cooter, MD;  Location: [place];  Service: Orthopedics;  Laterality: Right;   HYDROCELE EXCISION Right 08/26/2015   Procedure: RIGHT HYDROCELECTOMY ADULT;  Surgeon: Franchot Gallo, MD;  Location[facility];  Service: Urology;  Laterality: Right;   INGUINAL HERNIA REPAIR Right 10/29/2014   Procedure: LAPAROSCOPIC REPAIR  RECURRENT RIGHT INGUINAL ;  Surgeon: Fanny Skates, MD;  [facility];  Service: General;  Laterality: Right;   INGUINAL HERNIA REPAIR Right 1984   INSERTION OF MESH Right 10/29/2014   Procedure: INSERTION OF MESH;  Surgeon: Fanny Skates, MD;  [facility];  Service: General;  Laterality: Right;   INSERTION OF MESH N/A 06/17/2020   Procedure: INSERTION OF MESH;  Surgeon: Coralie Keens, MD;  Location: [place];  Service: General;  Laterality: N/A;   POLYPECTOMY     TOE OSTEOTOMY Bilateral right 2002/  left 2013   great toe   TOTAL HIP ARTHROPLASTY Left 09/22/2019   Procedure: LEFT TOTAL HIP ARTHROPLASTY ANTERIOR APPROACH;  Surgeon: Mcarthur Rossetti, MD;  [facility];  Service: Orthopedics;  Laterality: Left;   UMBILICAL HERNIA REPAIR N/A 06/17/2020   Procedure: UMBILICAL HERNIA REPAIR;  Surgeon: Coralie Keens, MD;  Location: [place];  Service: General;  Laterality: N/A;    There were no vitals filed for this visit.   Subjective Assessment - 08/26/21 0847     Subjective doing well; no pain    Pertinent History Elbow OA, L hip THA, hand OA, L first MTP fusion (R is next), previous hernia repair, HTN, hypothyroid    How long can you sit comfortably? Up to 2 hours (was 10-15 minutes)    How long can  you stand comfortably? As long as he needs to.    How long can you walk comfortably? Is walking short distances without pain.  Walking for exercise encouraged.    Patient Stated Goals Get back to normal activities without pain.    Currently in Pain? No/denies                [facility] PT Assessment - 08/26/21 7543       Assessment   Medical Diagnosis Low back and R hip/gluteal pain    Referring Provider (PT) Pete Pelt PA-C      Observation/Other Assessments   Focus on Therapeutic Outcomes (FOTO)  99                           [facility] Adult PT Treatment/Exercise - 08/26/21 0850       Lumbar Exercises:  Stretches   Hip Flexor Stretch Right;Left;3 reps;30 seconds      Lumbar Exercises: Standing   Row Both   3x10   Theraband Level (Row) Level 4 (Blue)      Lumbar Exercises: Prone   Opposite Arm/Leg Raise Right arm/Left leg;Left arm/Right leg;20 reps;5 seconds    Other Prone Lumbar Exercises Prone superman (B arm and leg extensions) palms in, toes back, elbows in by ears, head in neutral 20X 5 seconds      Lumbar Exercises: Quadruped   Plank forearm plank x 45 sec, 60 sec                       PT Short Term Goals - 08/14/21 1340       PT SHORT TERM GOAL #1   Title Improve trunk extension AROM to 10 degrees.    Baseline 5 degrees at evaluation    Time 4    Period Weeks    Status Achieved    Target Date 08/21/21      PT SHORT TERM GOAL #2   Title Improve hip flexion to 95 degrees bilaterally.    Baseline 85-90 degrees at evaluation    Time 4    Period Weeks    Status Partially Met    Target Date 08/21/21               PT Long Term Goals - 08/14/21 1341       PT LONG TERM GOAL #1   Title Improve FOTO to 75.    Baseline 91 (56 at evaluation)    Time 8    Period Weeks    Status Achieved      PT LONG TERM GOAL #2   Title Robert Blair will report low back and R hip/gluteal pain consistently 0-2/10 on the Numeric Pain Rating Scale.    Baseline 0-3/10 (was 5-7/10 at worst)    Time 8    Period Weeks    Status Partially Met    Target Date 09/18/21      PT LONG TERM GOAL #3   Title Improve trunk extension AROM to 15 degrees.    Baseline 10 (was 5) degrees.    Time 8    Period Weeks    Status On-going    Target Date 09/18/21      PT LONG TERM GOAL #4   Title Improve B hip ER to 35-40 degrees.    Baseline 30-40 (was 24-28) degrees    Time 8    Period Weeks    Status On-going  Target Date 09/18/21      PT LONG TERM GOAL #5   Title Robert Blair will be independent with his long-term maintenece HEP at [place].    Baseline Started today    Time 8    Period Weeks     Status On-going    Target Date 09/18/21                   Plan - 08/26/21 0924     Clinical Impression Statement Pt has scored a 99 on FOTO score and overall doing well with PT without pain.  Feel he's ready for d/c so will plan for next visit.    Personal Factors and Comorbidities Comorbidity 3+    Comorbidities Elbow OA, L hip THA, hand OA, L first MTP fusion (R is next), previous hernia repair, HTN, hypothyroid    Examination-Activity Limitations Stand;Bed Mobility;Bend;Sit;Lift;Carry    Examination-Participation Restrictions Community Activity    Stability/Clinical Decision Making Stable/Uncomplicated    Rehab Potential Good    PT Frequency 2x / week    PT Duration 4 weeks    PT Treatment/Interventions ADLs/Self Care Home Management;Electrical Stimulation;Cryotherapy;Traction;Therapeutic activities;Neuromuscular re-education;Therapeutic exercise;Patient/family education;Manual techniques;Dry needling    PT Next Visit Plan check goals, d/c due to progress    PT Home Exercise Plan Access Code: [email or web address]    Consulted and Agree with Plan of Care Patient             Patient will benefit from skilled therapeutic intervention in order to improve the following deficits and impairments:  Decreased activity tolerance, Decreased endurance, Decreased range of motion, Difficulty walking, Decreased strength, Increased edema, Increased muscle spasms, Impaired flexibility, Impaired perceived functional ability, Improper body mechanics, Postural dysfunction, Pain  Visit Diagnosis: Abnormal posture  Difficulty walking  Muscle weakness (generalized)  Chronic right-sided low back pain with right-sided sciatica     Problem List Patient Active Problem List   Diagnosis Date Noted   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 06/16/2021   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 06/16/2021    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 06/16/2021   Finger mass, right 06/16/2021   Hallux rigidus, right foot    Pain from implanted hardware    Hallux rigidus, left foot 07/30/2020   Pain in right shoulder 07/30/2020   Great toe pain, right 05/09/2020   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   Unilateral primary osteoarthritis, left hip 09/22/2019   Tendinitis, de Quervain's 06/02/2019   Pain of left hip joint 02/07/2019   Recurrent right inguinal hernia 10/29/2014   Hypertension 01/01/2012   Hypothyroidism 01/01/2012   History of adenomatous polyp of colon 01/01/2012      Laureen Abrahams, PT, DPT 08/26/21 9:25 AM     [facility] [address] [place], [place], [address] Phone: [phone]   Fax:  [phone]  Name: TIMM BONENBERGER MRN: [id number] Date of Birth: March 22, 1956

## 2021-08-28 ENCOUNTER — Ambulatory Visit (INDEPENDENT_AMBULATORY_CARE_PROVIDER_SITE_OTHER): Payer: BC Managed Care – PPO | Admitting: Rehabilitative and Restorative Service Providers"

## 2021-08-28 ENCOUNTER — Encounter: Payer: Self-pay | Admitting: Rehabilitative and Restorative Service Providers"

## 2021-08-28 ENCOUNTER — Other Ambulatory Visit: Payer: Self-pay

## 2021-08-28 DIAGNOSIS — M5441 Lumbago with sciatica, right side: Secondary | ICD-10-CM

## 2021-08-28 DIAGNOSIS — R262 Difficulty in walking, not elsewhere classified: Secondary | ICD-10-CM | POA: Diagnosis not present

## 2021-08-28 DIAGNOSIS — G8929 Other chronic pain: Secondary | ICD-10-CM

## 2021-08-28 DIAGNOSIS — M6281 Muscle weakness (generalized): Secondary | ICD-10-CM | POA: Diagnosis not present

## 2021-08-28 DIAGNOSIS — R293 Abnormal posture: Secondary | ICD-10-CM

## 2021-08-28 NOTE — Patient Instructions (Signed)
Access Code: [email or web address] URL: [email or web address] Date: 08/28/2021 Prepared by: Vista Mink  Exercises Standing Lumbar Extension at [place] 5 x daily - 7 x weekly - 1 sets - 5 reps - 3 seconds hold Standing Scapular Retraction - 5 x daily - 7 x weekly - 1 sets - 5 reps - 5 second hold Single Knee to Chest Stretch - 1 x daily - 7 x weekly - 1 sets - 4-5 reps - 20 seconds hold Supine Figure 4 Piriformis Stretch - 1 x daily - 7 x weekly - 1 sets - 5 reps - 20 seconds hold Prone Alternating Arm and Leg Lifts - 1 x daily - 7 x weekly - 2-3 sets - 10 reps - 3-10 seconds hold Full Superman on Table - 1 x daily - 7 x weekly - 1 sets - 5-10 reps - 5 seconds hold

## 2021-08-28 NOTE — Therapy (Signed)
[facility] [address] [place], [place], [address] Phone: [phone]   Fax:  [phone]  Physical Therapy Treatment/Discharge  Patient Details  Name: Robert Blair MRN: [id number] Date of Birth: 1955/11/05 Referring Provider (PT): Pete Pelt PA-C  PHYSICAL THERAPY DISCHARGE SUMMARY  Visits from Start of Care: 8  Current functional level related to goals / functional outcomes: See note   Remaining deficits: See note   Education / Equipment: Updated HEP   Patient agrees to discharge. Patient goals were met. Patient is being discharged due to being pleased with the current functional level.   Encounter Date: 08/28/2021   PT End of Session - 08/28/21 0921     Visit Number 8    Number of Visits 12    Date for PT Re-Evaluation 09/18/21    Progress Note Due on Visit 10    PT Start Time 0845    PT Stop Time 0910    PT Time Calculation (min) 25 min    Activity Tolerance Patient tolerated treatment well;No increased pain    Behavior During Therapy [facility] for tasks assessed/performed             Past Medical History:  Diagnosis Date   Arthritis    Cancer ([place]) 2016   Basal cell   Complication of anesthesia    URINARY RETENTION   Depression    History of adenomatous polyp of colon    2008/  2013/  2016   History of basal cell carcinoma excision    2015  nose   History of urinary retention    10-30-2014  POST OP   Hypothyroidism    Seasonal allergies    Unspecified essential hypertension     Past Surgical History:  Procedure Laterality Date   ARTHRODESIS METATARSALPHALANGEAL JOINT (MTPJ) Right 08/13/2020   Procedure: FUSION RIGHT GREAT TOE METATARSALPHALANGEAL JOINT (MTPJ);  Surgeon: Newt Minion, MD;  Location: [place];  Service: Orthopedics;  Laterality: Right;   COLONOSCOPY  last one 02-25-2015   GANGLION CYST EXCISION Right 08/04/2021   Procedure: EXCISION MASS RIGHT MIDDLE FINGER;  Surgeon:  Sherilyn Cooter, MD;  Location: [place];  Service: Orthopedics;  Laterality: Right;   HYDROCELE EXCISION Right 08/26/2015   Procedure: RIGHT HYDROCELECTOMY ADULT;  Surgeon: Franchot Gallo, MD;  Location: [facility];  Service: Urology;  Laterality: Right;   INGUINAL HERNIA REPAIR Right 10/29/2014   Procedure: LAPAROSCOPIC REPAIR RECURRENT RIGHT INGUINAL ;  Surgeon: Fanny Skates, MD;  Location: [facility];  Service: General;  Laterality: Right;   INGUINAL HERNIA REPAIR Right 1984   INSERTION OF MESH Right 10/29/2014   Procedure: INSERTION OF MESH;  Surgeon: Fanny Skates, MD;  Location[facility];  Service: General;  Laterality: Right;   INSERTION OF MESH N/A 06/17/2020   Procedure: INSERTION OF MESH;  Surgeon: Coralie Keens, MD;  Location: [place];  Service: General;  Laterality: N/A;   POLYPECTOMY     TOE OSTEOTOMY Bilateral right 2002/  left 2013   great toe   TOTAL HIP ARTHROPLASTY Left 09/22/2019   Procedure: LEFT TOTAL HIP ARTHROPLASTY ANTERIOR APPROACH;  Surgeon: Mcarthur Rossetti, MD;  Location: [facility];  Service: Orthopedics;  Laterality: Left;   UMBILICAL HERNIA REPAIR N/A 06/17/2020   Procedure: UMBILICAL HERNIA REPAIR;  Surgeon: Coralie Keens, MD;  Location: [place];  Service: General;  Laterality: N/A;    There were no vitals filed for this visit.  Subjective Assessment - 08/28/21 0917     Subjective Robert Blair is very happy with progress gained with his supervised PT.  He has a few questions and would like to DC today.    Pertinent History Elbow OA, L hip THA, hand OA, L first MTP fusion (R is next), previous hernia repair, HTN, hypothyroid    How long can you sit comfortably? Up to 2 hours (was 10-15 minutes)    How long can you stand comfortably? As long as he needs to.    How long can you walk comfortably? Is walking short distances without pain.  Walking for exercise encouraged.    Patient Stated Goals  Get back to normal activities without pain.    Currently in Pain? No/denies    Pain Score 0-No pain    Pain Radiating Towards Was to R foot, no longer    Pain Onset More than a month ago    Pain Frequency Rarely    Aggravating Factors  Prolonged sitting and poor mechanics    Pain Relieving Factors Extension exercises and careful attention to posture and body mechanics    Effect of Pain on Daily Activities Is doing everything normally now (golf, exercise)    Multiple Pain Sites No                [facility] PT Assessment - 08/28/21 0001       ROM / Strength   AROM / PROM / Strength AROM      AROM   Overall AROM  Deficits    AROM Assessment Site Hip;Lumbar    Right/Left Hip Left;Right    Right Hip Flexion 95    Right Hip External Rotation  40    Right Hip Internal Rotation  5    Left Hip Flexion 85    Left Hip External Rotation  32    Left Hip Internal Rotation  8    Lumbar Extension 10      Strength   Overall Strength Comments 10X 5 seconds Superman                           [facility] Adult PT Treatment/Exercise - 08/28/21 0001       Posture/Postural Control   Posture/Postural Control Postural limitations    Postural Limitations Forward head;Rounded Shoulders;Decreased lumbar lordosis      Therapeutic Activites    Therapeutic Activities ADL's;Other Therapeutic Activities    ADL's Getting in and out of bed, exercise correction (particularly hip hike)    Other Therapeutic Activities Discussed gym exercises to prioritize (pull to chest, leg press, mechanics with chest press-palms in)      Exercises   Exercises Lumbar      Lumbar Exercises: Stretches   Single Knee to Chest Stretch Right;Left;2 reps;20 seconds    Single Knee to Chest Stretch Limitations 2X knee to chest; 2X knee to chest with leg off table; 2X standing    Figure 4 Stretch 2 reps;20 seconds;Without overpressure    Other Lumbar Stretch Exercise Standing lumbar/trunk extension (hip forward)  10X 3 seconds      Lumbar Exercises: Standing   Other Standing Lumbar Exercises Hip hike shoulder against door frame 2 sets of 10 shoulder against wall to avoid lateral lean      Lumbar Exercises: Prone   Opposite Arm/Leg Raise Left arm/Right leg;Right arm/Left leg;10 reps;5 seconds    Other Prone Lumbar Exercises Prone superman (B arm and leg extensions) palms in,  toes back, elbows in by ears, head in neutral 10X 5 seconds                     PT Education - 08/28/21 0920     Education Details Reviewed questions regarding his HEP and discussed long-term gym program.  Reviewed some body mechanics.    Person(s) Educated Patient    Methods Demonstration;Verbal cues    Comprehension Verbalized understanding;Returned demonstration;Verbal cues required              PT Short Term Goals - 08/14/21 1340       PT SHORT TERM GOAL #1   Title Improve trunk extension AROM to 10 degrees.    Baseline 5 degrees at evaluation    Time 4    Period Weeks    Status Achieved    Target Date 08/21/21      PT SHORT TERM GOAL #2   Title Improve hip flexion to 95 degrees bilaterally.    Baseline 85-90 degrees at evaluation    Time 4    Period Weeks    Status Partially Met    Target Date 08/21/21               PT Long Term Goals - 08/28/21 0921       PT LONG TERM GOAL #1   Title Improve FOTO to 75.    Baseline 91 (56 at evaluation)    Time 8    Period Weeks    Status Achieved      PT LONG TERM GOAL #2   Title Robert Blair will report low back and R hip/gluteal pain consistently 0-2/10 on the Numeric Pain Rating Scale.    Baseline 0-1/10 (was 5-7/10 at worst)    Time 8    Period Weeks    Status Achieved    Target Date 09/18/21      PT LONG TERM GOAL #3   Title Improve trunk extension AROM to 15 degrees.    Baseline 10 (was 5) degrees.    Time 8    Period Weeks    Status On-going    Target Date 09/18/21      PT LONG TERM GOAL #4   Title Improve B hip ER to 35-40  degrees.    Baseline 30-40 (was 24-28) degrees    Time 8    Period Weeks    Status Partially Met    Target Date 09/18/21      PT LONG TERM GOAL #5   Title Robert Blair will be independent with his long-term maintenece HEP at DC.    Baseline Updated    Time 8    Period Weeks    Status Achieved    Target Date 09/18/21                   Plan - 08/28/21 5852     Clinical Impression Statement Robert Blair has met most long-term goals.  He has demonstrated independence with his long-term HEP.  He reports being ready for transfer into independent rehabilitation.    Personal Factors and Comorbidities Comorbidity 3+    Comorbidities Elbow OA, L hip THA, hand OA, L first MTP fusion (R is next), previous hernia repair, HTN, hypothyroid    Examination-Activity Limitations Stand;Bed Mobility;Bend;Sit;Lift;Carry    Examination-Participation Restrictions Community Activity    Stability/Clinical Decision Making Stable/Uncomplicated    Rehab Potential Good    PT Frequency Other (comment)    PT Duration Other (comment)  DC   PT Treatment/Interventions ADLs/Self Care Home Management;Electrical Stimulation;Cryotherapy;Traction;Therapeutic activities;Neuromuscular re-education;Therapeutic exercise;Patient/family education;Manual techniques;Dry needling    PT Next Visit Plan DC    PT Home Exercise Plan Access Code: [email or web address]    Consulted and Agree with Plan of Care Patient             Patient will benefit from skilled therapeutic intervention in order to improve the following deficits and impairments:  Decreased activity tolerance, Decreased endurance, Decreased range of motion, Difficulty walking, Decreased strength, Increased edema, Increased muscle spasms, Impaired flexibility, Impaired perceived functional ability, Improper body mechanics, Postural dysfunction, Pain  Visit Diagnosis: Abnormal posture  Difficulty walking  Muscle weakness (generalized)  Chronic right-sided low back pain with  right-sided sciatica     Problem List Patient Active Problem List   Diagnosis Date Noted   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 06/16/2021   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 06/16/2021   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 06/16/2021   Finger mass, right 06/16/2021   Hallux rigidus, right foot    Pain from implanted hardware    Hallux rigidus, left foot 07/30/2020   Pain in right shoulder 07/30/2020   Great toe pain, right 05/09/2020   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   Unilateral primary osteoarthritis, left hip 09/22/2019   Tendinitis, de Quervain's 06/02/2019   Pain of left hip joint 02/07/2019   Recurrent right inguinal hernia 10/29/2014   Hypertension 01/01/2012   Hypothyroidism 01/01/2012   History of adenomatous polyp of colon 01/01/2012    Farley Ly, PT, MPT 08/28/2021, 9:25 AM  [facility] [address] [place], [place], [address] Phone: [phone]   Fax:  [phone]  Name: Robert Blair MRN: [id number] Date of Birth: March 21, 1956

## 2021-09-02 ENCOUNTER — Encounter: Payer: BC Managed Care – PPO | Admitting: Physical Therapy

## 2021-09-04 ENCOUNTER — Encounter: Payer: BC Managed Care – PPO | Admitting: Physical Therapy

## 2021-09-04 ENCOUNTER — Other Ambulatory Visit: Payer: Self-pay | Admitting: Orthopedic Surgery

## 2021-09-04 ENCOUNTER — Telehealth: Payer: Self-pay | Admitting: Orthopedic Surgery

## 2021-09-04 MED ORDER — DICLOFENAC SODIUM 75 MG PO TBEC: 75.0000 mg | DELAYED_RELEASE_TABLET | Freq: Two times a day (BID) | ORAL | 0 refills | Status: DC

## 2021-09-04 NOTE — Telephone Encounter (Signed)
Patient called. He would like some anti inflammatory pills called in for him. His call back number is [phone]

## 2021-09-10 ENCOUNTER — Other Ambulatory Visit: Payer: Self-pay | Admitting: Orthopaedic Surgery

## 2021-10-23 ENCOUNTER — Other Ambulatory Visit: Payer: Self-pay

## 2021-10-23 ENCOUNTER — Ambulatory Visit: Payer: BC Managed Care – PPO | Admitting: Orthopedic Surgery

## 2021-10-23 DIAGNOSIS — M654 Radial styloid tenosynovitis [de Quervain]: Secondary | ICD-10-CM | POA: Diagnosis not present

## 2021-10-23 MED ORDER — LIDOCAINE HCL 1 % IJ SOLN
1.0000 mL | INTRAMUSCULAR | Status: AC | PRN
  Administered 2021-10-23: 1 mL

## 2021-10-23 MED ORDER — BETAMETHASONE SOD PHOS & ACET 6 (3-3) MG/ML IJ SUSP
6.0000 mg | INTRAMUSCULAR | Status: AC | PRN
  Administered 2021-10-23: 6 mg via INTRA_ARTICULAR

## 2021-10-23 NOTE — Progress Notes (Signed)
Office Visit Note   Patient: Robert Blair           Date of Birth: [birth year]           MRN: [id number] Visit Date: 10/23/2021              Requested by: [name], MD [address] [place],  [place] [address] PCP: [name], MD   Assessment & Plan: Visit Diagnoses:  1. Tendinitis, de Quervain's     Plan: Patient presents with recurrence of his right de Quervain's tenosynovitis.  Has been seen previously for the same issue.  He had a corticosteroid injection years ago with significant symptom relief.  He would like a repeat injection of his first dorsal compartment.  In addition, he has been taking diclofenac for hand arthritis with great results.  He would like a prescription refill.  I can see him back as needed for his arthritis pain.  Follow-Up Instructions: No follow-ups on file.   Orders:  No orders of the defined types were placed in this encounter.  No orders of the defined types were placed in this encounter.     Procedures: Hand/UE Inj: R extensor compartment 1 for de Quervain's tenosynovitis on 10/23/2021 6:27 PM Indications: pain and tendon swelling Details: 25 G needle, radial approach Medications: 1 mL lidocaine 1 %; 6 mg betamethasone acetate-betamethasone sodium phosphate 6 (3-3) MG/ML Outcome: tolerated well, no immediate complications Procedure, treatment alternatives, risks and benefits explained, specific risks discussed. Consent was given by the patient. Patient was prepped and draped in the usual sterile fashion.      Clinical Data: No additional findings.   Subjective: Chief Complaint  Patient presents with   Right Hand - Pain    This is a 66 year old right-hand-dominant male who presents with pain at the radial aspect of the right wrist at the area of the radial styloid.  Is been going on for some time now.  He has been previously seen by one of my partners and underwent corticosteroid injection years ago.  He got significant  symptom relief with this and would like a repeat injection today.  He is also gotten significant relief from his diclofenac for his hand arthritis.   Review of Systems   Objective: Vital Signs: There were no vitals taken for this visit.  Physical Exam  Right Hand Exam   Tenderness  Right hand tenderness location: Tender palpation at the radial styloid in the area of the first dorsal compartment.  Range of Motion  The patient has normal right wrist ROM.   Other  Erythema: absent Sensation: normal Pulse: present  Comments:  ++ Finkelstein test.  TTP at radial styloid.  No pain w/ [facility] grind test.      Specialty Comments:  No specialty comments available.  Imaging: No results found.   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   Unilateral primary osteoarthritis, left hip 09/22/2019   Tendinitis,  de Quervain's 06/02/2019   Pain of left hip joint 02/07/2019   Recurrent right inguinal hernia 10/29/2014   Hypertension 01/01/2012   Hypothyroidism 01/01/2012   History of adenomatous polyp of colon 01/01/2012   Past Medical History:  Diagnosis Date   Arthritis    Cancer ([place]) 2016   Basal cell   Complication of anesthesia    URINARY RETENTION   Depression    History of adenomatous polyp of colon    2008/  2013/  2016   History of basal cell carcinoma excision    2015  nose   History of urinary retention     10-30-2014  POST OP   Hypothyroidism    Seasonal allergies    Unspecified essential hypertension     Family History  Problem Relation Age of Onset   Breast cancer Mother    Hypertension Mother    Depression Mother    Cancer - Other Mother        unknown GI cancer   Colon polyps Father    Stroke Father    Alzheimer's disease Father    Colon cancer Neg Hx    Rectal cancer Neg Hx    Stomach cancer Neg Hx    Liver disease Neg Hx     Past Surgical History:  Procedure Laterality Date   ARTHRODESIS METATARSALPHALANGEAL JOINT (MTPJ) Right 08/13/2020   Procedure: FUSION RIGHT GREAT TOE METATARSALPHALANGEAL JOINT (MTPJ);  Surgeon: Newt Minion, MD;  Location: [place];  Service: Orthopedics;  Laterality: Right;   COLONOSCOPY  last one 02-25-2015   GANGLION CYST EXCISION Right 08/04/2021   Procedure: EXCISION MASS RIGHT MIDDLE FINGER;  Surgeon: Sherilyn Cooter, MD;  Location: [place];  Service: Orthopedics;  Laterality: Right;   HYDROCELE EXCISION Right 08/26/2015   Procedure: RIGHT HYDROCELECTOMY ADULT;  Surgeon: Franchot Gallo, MD;  Location: [facility];  Service: Urology;  Laterality: Right;   INGUINAL HERNIA REPAIR Right 10/29/2014   Procedure: LAPAROSCOPIC REPAIR RECURRENT RIGHT INGUINAL ;  Surgeon: Fanny Skates, MD;  [facility];  Service: General;  Laterality: Right;   INGUINAL HERNIA REPAIR Right 1984   INSERTION OF MESH Right 10/29/2014   Procedure: INSERTION OF MESH;  Surgeon: Fanny Skates, MD;  [facility];  Service: General;  Laterality: Right;   INSERTION OF MESH N/A 06/17/2020   Procedure: INSERTION OF MESH;  Surgeon: Coralie Keens, MD;  Location: [place];  Service: General;  Laterality: N/A;   POLYPECTOMY     TOE OSTEOTOMY Bilateral right 2002/  left 2013   great toe   TOTAL HIP ARTHROPLASTY Left 09/22/2019   Procedure: LEFT TOTAL HIP ARTHROPLASTY ANTERIOR APPROACH;  Surgeon: Mcarthur Rossetti, MD;  [facility];  Service: Orthopedics;  Laterality: Left;   UMBILICAL HERNIA REPAIR N/A 06/17/2020   Procedure: UMBILICAL HERNIA REPAIR;  Surgeon: Coralie Keens, MD;  Location: [place];  Service: General;  Laterality: N/A;   Social History   Occupational History   Occupation: self employed  Tobacco Use   Smoking status: Never   Smokeless tobacco: Never  Vaping Use   Vaping Use: Never used  Substance and Sexual Activity   Alcohol use: Yes    Comment: social   Drug use: No   Sexual activity: Yes

## 2021-10-31 ENCOUNTER — Other Ambulatory Visit: Payer: Self-pay | Admitting: Orthopedic Surgery

## 2022-02-06 ENCOUNTER — Encounter: Payer: Self-pay | Admitting: Orthopedic Surgery

## 2022-02-06 ENCOUNTER — Ambulatory Visit: Payer: BC Managed Care – PPO | Admitting: Orthopedic Surgery

## 2022-02-06 DIAGNOSIS — M654 Radial styloid tenosynovitis [de Quervain]: Secondary | ICD-10-CM

## 2022-02-06 DIAGNOSIS — M19041 Primary osteoarthritis, right hand: Secondary | ICD-10-CM

## 2022-02-06 MED ORDER — LIDOCAINE HCL 1 % IJ SOLN
0.5000 mL | INTRAMUSCULAR | Status: AC | PRN
  Administered 2022-02-06: .5 mL

## 2022-02-06 MED ORDER — BETAMETHASONE SOD PHOS & ACET 6 (3-3) MG/ML IJ SUSP
6.0000 mg | INTRAMUSCULAR | Status: AC | PRN
  Administered 2022-02-06: 6 mg via INTRA_ARTICULAR

## 2022-02-06 MED ORDER — LIDOCAINE HCL 1 % IJ SOLN
1.0000 mL | INTRAMUSCULAR | Status: AC | PRN
  Administered 2022-02-06: 1 mL

## 2022-02-06 MED ORDER — BETAMETHASONE SOD PHOS & ACET 6 (3-3) MG/ML IJ SUSP
3.0000 mg | INTRAMUSCULAR | Status: AC | PRN
  Administered 2022-02-06: 3 mg via INTRA_ARTICULAR

## 2022-02-06 NOTE — Progress Notes (Signed)
Office Visit Note   Patient: Robert Blair           Date of Birth: Mar 17, 1956           MRN: [id number] Visit Date: 02/06/2022              Requested by: [name], MD [address] [place],  [place] [address] PCP: [name], MD   Assessment & Plan: Visit Diagnoses:  1. 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 Tendinitis, de Quervain's     Plan: Patient presents with continued pain at the radial wrist and there is previously injected de Quervain's tenosynovitis.  Injection lasted from February until just recently.  He would like a repeat corticosteroid injection into the first dorsal compartment.  We also reviewed the nature of MCP arthritis and both conservative and surgical treatment options.  After our discussion, he would like to proceed with a corticosteroid injection into the right index finger MCP joint under fluoroscopic guidance.  I can see him back in the office as needed when or if his symptoms return.  Follow-Up Instructions: No follow-ups on file.   Orders:  No orders of the defined types were placed in this encounter.  No orders of the defined types were placed in this encounter.     Procedures: Hand/UE Inj: R extensor compartment 1 for de Quervain's tenosynovitis on 02/06/2022 2:17 PM Indications: tendon swelling and therapeutic Details: 25 G needle, radial approach Medications: 1 mL lidocaine 1 %; 6 mg betamethasone acetate-betamethasone sodium phosphate 6 (3-3) MG/ML Procedure, treatment alternatives, risks and benefits explained, specific risks discussed. Consent was given by the patient. Immediately prior to procedure a time out was called to verify the correct patient, procedure, equipment, support staff and site/side marked as required. Patient was prepped and draped in the usual sterile fashion.    Hand/UE Inj: R index MCP for osteoarthritis on 02/06/2022 2:17 PM Indications: pain and  therapeutic Details: 25 G needle, fluoroscopy-guided radial approach Medications: 0.5 mL lidocaine 1 %; 3 mg betamethasone acetate-betamethasone sodium phosphate 6 (3-3) MG/ML Procedure, treatment alternatives, risks and benefits explained, specific risks discussed. Consent was given by the patient. Immediately prior to procedure a time out was called to verify the correct patient, procedure, equipment, support staff and site/side marked as required. Patient was prepped and draped in the usual sterile fashion.      Clinical Data: No additional findings.   Subjective: Chief Complaint  Patient presents with   Right Wrist - Follow-up    This is a 66 year old right-hand-dominant male presents for follow-up of the right radial sided wrist pain and right index finger pain at the MCP joint.  He was last seen in my office on 10/23/2021 at which time we injected his right first dorsal compartment.  He had significant symptom relief until just recently.  He continues to complain of pain at the radial aspect of the wrist over the first dorsal compartment.  Pain is worse with range of motion of the thumb.  He also describes continued pain at the index finger MCP joint.  His index finger is quite stiff first thing in the morning and he has pain with prolonged activity.  Has limited range of motion at the index MCP joint.  He has been taking diclofenac with moderate symptom relief.     Review of Systems   Objective: Vital Signs: There were no vitals taken for this visit.  Physical Exam  Right Hand Exam   Tenderness  Right hand tenderness location: TTP over first dorsal compartment and at index MCP joint.  Other  Erythema: absent Sensation: normal Pulse: present  Comments:  Positive Finkelstein test.  Moderate swelling at first dorsal compartment.  Limited ROM of index finger at MCP joint w/ mild to moderate swelling.      Specialty Comments:  No specialty comments  available.  Imaging: No results found.   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   Unilateral primary osteoarthritis, left hip 09/22/2019   Tendinitis, de Quervain's 06/02/2019   Pain of left hip joint 02/07/2019   Recurrent right inguinal hernia 10/29/2014   Hypertension 01/01/2012   Hypothyroidism 01/01/2012   History of adenomatous polyp of colon 01/01/2012   Past Medical History:  Diagnosis Date   Arthritis    Cancer ([facility]) 2016   Basal cell   Complication of anesthesia    URINARY RETENTION   Depression    History of adenomatous polyp of colon    2008/  2013/  2016   History of basal cell carcinoma excision    2015  nose   History of urinary retention    10-30-2014  POST OP   Hypothyroidism    Seasonal allergies    Unspecified essential hypertension     Family History  Problem Relation Age of Onset   Breast cancer Mother    Hypertension Mother    Depression Mother    Cancer - Other Mother        unknown GI cancer   Colon polyps Father    Stroke Father    Alzheimer's disease Father    Colon cancer Neg Hx    Rectal cancer Neg Hx    Stomach cancer Neg Hx    Liver disease Neg Hx     Past Surgical History:  Procedure Laterality Date   ARTHRODESIS METATARSALPHALANGEAL JOINT (MTPJ) Right 08/13/2020   Procedure: FUSION RIGHT GREAT TOE METATARSALPHALANGEAL JOINT (MTPJ);  Surgeon: Newt Minion, MD;  Location:  [facility];  Service: Orthopedics;  Laterality: Right;   COLONOSCOPY  last one 02-25-2015   GANGLION CYST EXCISION Right 08/04/2021   Procedure: EXCISION MASS RIGHT MIDDLE FINGER;  Surgeon: Sherilyn Cooter, MD;  Location: [facility];  Service: Orthopedics;  Laterality: Right;   HYDROCELE EXCISION Right 08/26/2015   Procedure: RIGHT HYDROCELECTOMY ADULT;  Surgeon: Franchot Gallo, MD;  Location: [facility];  Service: Urology;  Laterality: Right;   INGUINAL HERNIA REPAIR Right 10/29/2014   Procedure: LAPAROSCOPIC REPAIR RECURRENT RIGHT INGUINAL ;  Surgeon: Fanny Skates, MD;  Location: [facility];  Service: General;  Laterality: Right;   INGUINAL HERNIA REPAIR Right 1984   INSERTION OF MESH Right 10/29/2014   Procedure: INSERTION OF MESH;  Surgeon: Fanny Skates, MD;  Location: [facility];  Service: General;  Laterality: Right;   INSERTION OF MESH N/A 06/17/2020   Procedure: INSERTION OF MESH;  Surgeon: Coralie Keens, MD;  Location: [facility];  Service: General;  Laterality: N/A;   POLYPECTOMY     TOE OSTEOTOMY Bilateral right 2002/  left 2013   great toe   TOTAL HIP ARTHROPLASTY Left 09/22/2019   Procedure: LEFT TOTAL HIP ARTHROPLASTY ANTERIOR APPROACH;  Surgeon: Mcarthur Rossetti, MD;  Location: [facility];  Service: Orthopedics;  Laterality: Left;   UMBILICAL HERNIA REPAIR N/A 06/17/2020   Procedure: UMBILICAL HERNIA REPAIR;  Surgeon: Coralie Keens, MD;  Location: [facility];  Service: General;  Laterality: N/A;   Social History   Occupational History   Occupation: self employed  Tobacco Use   Smoking status: Never   Smokeless tobacco: Never  Vaping Use   Vaping Use: Never used  Substance and Sexual Activity   Alcohol use: Yes    Comment: social   Drug use: No   Sexual activity: Yes

## 2022-10-02 ENCOUNTER — Other Ambulatory Visit: Payer: Self-pay

## 2022-10-02 ENCOUNTER — Ambulatory Visit (HOSPITAL_COMMUNITY)
Admission: EM | Admit: 2022-10-02 | Discharge: 2022-10-02 | Disposition: A | Discharge status: Home / Self Care | Payer: BC Managed Care – PPO | Attending: Emergency Medicine | Admitting: Emergency Medicine

## 2022-10-02 ENCOUNTER — Emergency Department (HOSPITAL_COMMUNITY): Payer: BC Managed Care – PPO

## 2022-10-02 ENCOUNTER — Emergency Department (HOSPITAL_COMMUNITY): Payer: BC Managed Care – PPO | Admitting: Anesthesiology

## 2022-10-02 ENCOUNTER — Encounter (HOSPITAL_COMMUNITY)
Admission: EM | Disposition: A | Discharge status: Home / Self Care | Payer: Self-pay | Source: Home / Self Care | Attending: Emergency Medicine

## 2022-10-02 ENCOUNTER — Encounter (HOSPITAL_COMMUNITY): Payer: Self-pay | Admitting: Emergency Medicine

## 2022-10-02 DIAGNOSIS — E669 Obesity, unspecified: Secondary | ICD-10-CM | POA: Diagnosis not present

## 2022-10-02 DIAGNOSIS — R109 Unspecified abdominal pain: Secondary | ICD-10-CM | POA: Diagnosis present

## 2022-10-02 DIAGNOSIS — R160 Hepatomegaly, not elsewhere classified: Secondary | ICD-10-CM

## 2022-10-02 DIAGNOSIS — Z85828 Personal history of other malignant neoplasm of skin: Secondary | ICD-10-CM | POA: Insufficient documentation

## 2022-10-02 DIAGNOSIS — I10 Essential (primary) hypertension: Secondary | ICD-10-CM | POA: Diagnosis not present

## 2022-10-02 DIAGNOSIS — K8010 Calculus of gallbladder with chronic cholecystitis without obstruction: Secondary | ICD-10-CM | POA: Diagnosis not present

## 2022-10-02 DIAGNOSIS — K8020 Calculus of gallbladder without cholecystitis without obstruction: Secondary | ICD-10-CM

## 2022-10-02 DIAGNOSIS — Z6835 Body mass index (BMI) 35.0-35.9, adult: Secondary | ICD-10-CM | POA: Diagnosis not present

## 2022-10-02 DIAGNOSIS — E039 Hypothyroidism, unspecified: Secondary | ICD-10-CM | POA: Insufficient documentation

## 2022-10-02 HISTORY — PX: CHOLECYSTECTOMY: SHX55

## 2022-10-02 LAB — COMPREHENSIVE METABOLIC PANEL
ALT: 39 U/L (ref 0–44)
AST: 31 U/L (ref 15–41)
Albumin: 4.2 g/dL (ref 3.5–5.0)
Alkaline Phosphatase: 51 U/L (ref 38–126)
Anion gap: 8 (ref 5–15)
BUN: 20 mg/dL (ref 8–23)
CO2: 26 mmol/L (ref 22–32)
Calcium: 9 mg/dL (ref 8.9–10.3)
Chloride: 102 mmol/L (ref 98–111)
Creatinine, Ser: 0.94 mg/dL (ref 0.61–1.24)
GFR, Estimated: >60 mL/min (ref >60)
Glucose, Bld: 106 mg/dL — ABNORMAL HIGH (ref 70–99)
Potassium: 3.2 mmol/L — ABNORMAL LOW (ref 3.5–5.1)
Sodium: 136 mmol/L (ref 135–145)
Total Bilirubin: 0.7 mg/dL (ref 0.3–1.2)
Total Protein: 7.2 g/dL (ref 6.5–8.1)

## 2022-10-02 LAB — CBC WITH DIFFERENTIAL/PLATELET
Abs Immature Granulocytes: 0.05 10*3/uL (ref 0.00–0.07)
Basophils Absolute: 0 10*3/uL (ref 0.0–0.1)
Basophils Relative: 0 %
Eosinophils Absolute: 0.2 10*3/uL (ref 0.0–0.5)
Eosinophils Relative: 2 %
HCT: 49.6 % (ref 39.0–52.0)
Hemoglobin: 16.9 g/dL (ref 13.0–17.0)
Immature Granulocytes: 1 %
Lymphocytes Relative: 26 %
Lymphs Abs: 2.3 10*3/uL (ref 0.7–4.0)
MCH: 30.4 pg (ref 26.0–34.0)
MCHC: 34.1 g/dL (ref 30.0–36.0)
MCV: 89.2 fL (ref 80.0–100.0)
Monocytes Absolute: 0.6 10*3/uL (ref 0.1–1.0)
Monocytes Relative: 7 %
Neutro Abs: 5.8 10*3/uL (ref 1.7–7.7)
Neutrophils Relative %: 64 %
Platelets: 250 10*3/uL (ref 150–400)
RBC: 5.56 MIL/uL (ref 4.22–5.81)
RDW: 12.9 % (ref 11.5–15.5)
WBC: 9 10*3/uL (ref 4.0–10.5)
nRBC: 0 % (ref 0.0–0.2)

## 2022-10-02 LAB — URINALYSIS, ROUTINE W REFLEX MICROSCOPIC
Bacteria, UA: NONE SEEN
Bilirubin Urine: NEGATIVE
Glucose, UA: NEGATIVE mg/dL
Hgb urine dipstick: NEGATIVE
Ketones, ur: NEGATIVE mg/dL
Nitrite: NEGATIVE
Protein, ur: NEGATIVE mg/dL
Specific Gravity, Urine: >1.046 — ABNORMAL HIGH (ref 1.005–1.030)
pH: 5 (ref 5.0–8.0)

## 2022-10-02 LAB — TROPONIN I (HIGH SENSITIVITY)
Troponin I (High Sensitivity): 5 ng/L (ref <18)
Troponin I (High Sensitivity): 5 ng/L (ref <18)

## 2022-10-02 LAB — LIPASE, BLOOD: Lipase: 33 U/L (ref 11–51)

## 2022-10-02 SURGERY — LAPAROSCOPIC CHOLECYSTECTOMY
Anesthesia: General

## 2022-10-02 MED ORDER — TRAMADOL HCL 50 MG PO TABS: 100.0000 mg | ORAL_TABLET | Freq: Four times a day (QID) | ORAL | 0 refills | Status: AC | PRN

## 2022-10-02 MED ORDER — LACTATED RINGERS IV SOLN: INTRAVENOUS | Status: DC

## 2022-10-02 MED ORDER — MIDAZOLAM HCL 2 MG/2ML IJ SOLN
INTRAMUSCULAR | Status: DC | PRN
  Administered 2022-10-02: 2 mg via INTRAVENOUS

## 2022-10-02 MED ORDER — OXYCODONE HCL 5 MG/5ML PO SOLN: 5.0000 mg | Freq: Once | ORAL | Status: DC | PRN

## 2022-10-02 MED ORDER — PROMETHAZINE HCL 25 MG/ML IJ SOLN: 6.2500 mg | INTRAMUSCULAR | Status: DC | PRN

## 2022-10-02 MED ORDER — IOHEXOL 350 MG/ML SOLN
80.0000 mL | Freq: Once | INTRAVENOUS | Status: AC | PRN
  Administered 2022-10-02: 80 mL via INTRAVENOUS

## 2022-10-02 MED ORDER — PROPOFOL 10 MG/ML IV BOLUS
INTRAVENOUS | Status: AC
  Filled 2022-10-02: qty 20

## 2022-10-02 MED ORDER — BUPIVACAINE HCL 0.25 % IJ SOLN
INTRAMUSCULAR | Status: DC | PRN
  Administered 2022-10-02: 12 mL

## 2022-10-02 MED ORDER — ROCURONIUM BROMIDE 10 MG/ML (PF) SYRINGE
PREFILLED_SYRINGE | INTRAVENOUS | Status: AC
  Filled 2022-10-02: qty 10

## 2022-10-02 MED ORDER — SODIUM CHLORIDE 0.9 % IV SOLN
2.0000 g | INTRAVENOUS | Status: AC
  Administered 2022-10-02: 2 g via INTRAVENOUS
  Filled 2022-10-02: qty 20

## 2022-10-02 MED ORDER — DEXAMETHASONE SODIUM PHOSPHATE 10 MG/ML IJ SOLN
INTRAMUSCULAR | Status: DC | PRN
  Administered 2022-10-02: 10 mg via INTRAVENOUS

## 2022-10-02 MED ORDER — SODIUM CHLORIDE (PF) 0.9 % IJ SOLN
INTRAMUSCULAR | Status: AC
  Filled 2022-10-02: qty 50

## 2022-10-02 MED ORDER — PROPOFOL 10 MG/ML IV BOLUS
INTRAVENOUS | Status: DC | PRN
  Administered 2022-10-02: 150 mg via INTRAVENOUS

## 2022-10-02 MED ORDER — ONDANSETRON HCL 4 MG/2ML IJ SOLN
INTRAMUSCULAR | Status: DC | PRN
  Administered 2022-10-02: 4 mg via INTRAVENOUS

## 2022-10-02 MED ORDER — FENTANYL CITRATE (PF) 100 MCG/2ML IJ SOLN
INTRAMUSCULAR | Status: DC | PRN
  Administered 2022-10-02 (×3): 50 ug via INTRAVENOUS

## 2022-10-02 MED ORDER — ONDANSETRON HCL 4 MG/2ML IJ SOLN
4.0000 mg | Freq: Once | INTRAMUSCULAR | Status: AC
  Administered 2022-10-02: 4 mg via INTRAVENOUS
  Filled 2022-10-02: qty 2

## 2022-10-02 MED ORDER — FENTANYL CITRATE (PF) 100 MCG/2ML IJ SOLN
INTRAMUSCULAR | Status: AC
  Filled 2022-10-02: qty 2

## 2022-10-02 MED ORDER — BUPIVACAINE HCL (PF) 0.25 % IJ SOLN
INTRAMUSCULAR | Status: AC
  Filled 2022-10-02: qty 30

## 2022-10-02 MED ORDER — 0.9 % SODIUM CHLORIDE (POUR BTL) OPTIME
TOPICAL | Status: DC | PRN
  Administered 2022-10-02: 1000 mL

## 2022-10-02 MED ORDER — DEXAMETHASONE SODIUM PHOSPHATE 10 MG/ML IJ SOLN
INTRAMUSCULAR | Status: AC
  Filled 2022-10-02: qty 1

## 2022-10-02 MED ORDER — LIDOCAINE HCL (PF) 2 % IJ SOLN
INTRAMUSCULAR | Status: AC
  Filled 2022-10-02: qty 5

## 2022-10-02 MED ORDER — MEPERIDINE HCL 50 MG/ML IJ SOLN: 6.2500 mg | INTRAMUSCULAR | Status: DC | PRN

## 2022-10-02 MED ORDER — HYDROMORPHONE HCL 1 MG/ML IJ SOLN: 0.2500 mg | INTRAMUSCULAR | Status: DC | PRN

## 2022-10-02 MED ORDER — CHLORHEXIDINE GLUCONATE 0.12 % MT SOLN
15.0000 mL | Freq: Once | OROMUCOSAL | Status: AC
  Administered 2022-10-02: 15 mL via OROMUCOSAL

## 2022-10-02 MED ORDER — LACTATED RINGERS IR SOLN
Status: DC | PRN
  Administered 2022-10-02: 1000 mL

## 2022-10-02 MED ORDER — MIDAZOLAM HCL 2 MG/2ML IJ SOLN
INTRAMUSCULAR | Status: AC
  Filled 2022-10-02: qty 2

## 2022-10-02 MED ORDER — ONDANSETRON HCL 4 MG/2ML IJ SOLN
INTRAMUSCULAR | Status: AC
  Filled 2022-10-02: qty 2

## 2022-10-02 MED ORDER — MORPHINE SULFATE (PF) 4 MG/ML IV SOLN
4.0000 mg | Freq: Once | INTRAVENOUS | Status: AC
  Administered 2022-10-02: 4 mg via INTRAVENOUS
  Filled 2022-10-02: qty 1

## 2022-10-02 MED ORDER — OXYCODONE HCL 5 MG PO TABS: 5.0000 mg | ORAL_TABLET | Freq: Once | ORAL | Status: DC | PRN

## 2022-10-02 MED ORDER — ROCURONIUM BROMIDE 10 MG/ML (PF) SYRINGE
PREFILLED_SYRINGE | INTRAVENOUS | Status: DC | PRN
  Administered 2022-10-02: 50 mg via INTRAVENOUS

## 2022-10-02 MED ORDER — AMISULPRIDE (ANTIEMETIC) 5 MG/2ML IV SOLN: 10.0000 mg | Freq: Once | INTRAVENOUS | Status: DC | PRN

## 2022-10-02 MED ORDER — LIDOCAINE 2% (20 MG/ML) 5 ML SYRINGE
INTRAMUSCULAR | Status: DC | PRN
  Administered 2022-10-02: 80 mg via INTRAVENOUS

## 2022-10-02 MED ORDER — SUGAMMADEX SODIUM 200 MG/2ML IV SOLN
INTRAVENOUS | Status: DC | PRN
  Administered 2022-10-02: 200 mg via INTRAVENOUS

## 2022-10-02 SURGICAL SUPPLY — 35 items
APPLIER CLIP 5 13 M/L LIGAMAX5 (MISCELLANEOUS)
BAG COUNTER SPONGE SURGICOUNT (BAG) IMPLANT
CABLE HIGH FREQUENCY MONO STRZ (ELECTRODE) ×1 IMPLANT
CHLORAPREP W/TINT 26 (MISCELLANEOUS) ×1 IMPLANT
CLIP APPLIE 5 13 M/L LIGAMAX5 (MISCELLANEOUS) IMPLANT
CLIP LIGATING HEMO O LOK GREEN (MISCELLANEOUS) ×1 IMPLANT
COVER MAYO STAND XLG (MISCELLANEOUS) ×1 IMPLANT
COVER TRANSDUCER ULTRASND (DRAPES) IMPLANT
DERMABOND ADVANCED .7 DNX12 (GAUZE/BANDAGES/DRESSINGS) ×1 IMPLANT
DRAPE C-ARM 42X120 X-RAY (DRAPES) ×1 IMPLANT
ELECT REM PT RETURN 15FT ADLT (MISCELLANEOUS) ×1 IMPLANT
GAUZE SPONGE 2X2 STRL 8-PLY (GAUZE/BANDAGES/DRESSINGS) ×1 IMPLANT
GLOVE BIO SURGEON STRL SZ7.5 (GLOVE) ×1 IMPLANT
GOWN STRL REUS W/ TWL XL LVL3 (GOWN DISPOSABLE) ×3 IMPLANT
GOWN STRL REUS W/TWL XL LVL3 (GOWN DISPOSABLE) ×3
GRASPER SUT TROCAR 14GX15 (MISCELLANEOUS) IMPLANT
IRRIG SUCT STRYKERFLOW 2 WTIP (MISCELLANEOUS) ×1
IRRIGATION SUCT STRKRFLW 2 WTP (MISCELLANEOUS) ×1 IMPLANT
KIT BASIN OR (CUSTOM PROCEDURE TRAY) ×1 IMPLANT
KIT TURNOVER KIT A (KITS) IMPLANT
NDL INSUFFLATION 14GA 120MM (NEEDLE) ×1 IMPLANT
NEEDLE INSUFFLATION 14GA 120MM (NEEDLE) ×1 IMPLANT
PENCIL SMOKE EVACUATOR (MISCELLANEOUS) IMPLANT
POUCH RETRIEVAL ECOSAC 10 (ENDOMECHANICALS) IMPLANT
POUCH RETRIEVAL ECOSAC 10MM (ENDOMECHANICALS)
SCISSORS LAP 5X35 DISP (ENDOMECHANICALS) ×1 IMPLANT
SET CHOLANGIOGRAPH MIX (MISCELLANEOUS) ×1 IMPLANT
SET TUBE SMOKE EVAC HIGH FLOW (TUBING) ×1 IMPLANT
SLEEVE Z-THREAD 5X100MM (TROCAR) ×1 IMPLANT
SPIKE FLUID TRANSFER (MISCELLANEOUS) ×1 IMPLANT
SUT MNCRL AB 4-0 PS2 18 (SUTURE) ×1 IMPLANT
TOWEL OR 17X26 10 PK STRL BLUE (TOWEL DISPOSABLE) ×1 IMPLANT
TRAY LAPAROSCOPIC (CUSTOM PROCEDURE TRAY) ×1 IMPLANT
TROCAR 11X100 Z THREAD (TROCAR) ×1 IMPLANT
TROCAR Z-THREAD OPTICAL 5X100M (TROCAR) ×1 IMPLANT

## 2022-10-02 NOTE — Anesthesia Procedure Notes (Addendum)
Procedure Name: Intubation Date/Time: 10/02/2022 2:55 PM  Performed by: Sharlette Dense, CRNAPre-anesthesia Checklist: Patient identified, Emergency Drugs available, Suction available and Patient being monitored Patient Re-evaluated:Patient Re-evaluated prior to induction Oxygen Delivery Method: Circle system utilized Preoxygenation: Pre-oxygenation with 100% oxygen Induction Type: IV induction Ventilation: Oral airway inserted - appropriate to patient size and Mask ventilation with difficulty Laryngoscope Size: Miller and 3 Grade View: Grade II Tube type: Oral Tube size: 8.0 mm Number of attempts: 1 Airway Equipment and Method: Stylet and Oral airway Placement Confirmation: ETT inserted through vocal cords under direct vision, positive ETCO2 and breath sounds checked- equal and bilateral Secured at: 23 cm Tube secured with: Tape Dental Injury: Teeth and Oropharynx as per pre-operative assessment

## 2022-10-02 NOTE — ED Triage Notes (Signed)
Pt here from [facility] with c/o right upper quadrant abd pain with some slight nausea no vomiting , started on Wed

## 2022-10-02 NOTE — ED Provider Notes (Signed)
[facility] DEPT Provider Note   CSN: [id number] Arrival date & time: 10/02/22  8295     History  Chief Complaint  Patient presents with   Abdominal Pain    Robert Blair is a 67 y.o. male.   Abdominal Pain    Patient has history of hypertension, hypothyroidism, arthritis, status post inguinal hernia repair who presents to the ED with complaints of abdominal pain.  Patient states he started having symptoms a couple days ago.  He has been having some nausea and indigestion.  Patient started experiencing increasing pain in his right upper quadrant that goes to his back.  He denies any vomiting.  No diarrhea.  No dysuria or constipation.  Patient does not have history of appendectomy or cholecystectomy.  Patient denies regular alcohol use.  Home Medications Prior to Admission medications   Medication Sig Start Date End Date Taking? Authorizing Provider  ALPRAZolam (XANAX) 0.25 MG tablet Take 1 tablet (0.25 mg total) by mouth at bedtime as needed for anxiety. 11/07/20   Garald Balding, MD  amLODipine (NORVASC) 10 MG tablet Take 10 mg by mouth daily.     [provider]  Cholecalciferol (VITAMIN D3) 50 MCG (2000 UT) TABS Take 4,000 Units by mouth daily.    [provider]  clomiPHENE (CLOMID) 50 MG tablet Take 50 mg by mouth every other day. In the morning    [provider]  diclofenac (VOLTAREN) 75 MG EC tablet TAKE ONE TABLET BY MOUTH TWICE A DAY 11/01/21   Sherilyn Cooter, MD  docusate sodium (COLACE) 100 MG capsule Take 200 mg by mouth 2 (two) times daily.    [provider]  EX-LAX ULTRA 5 MG EC tablet Take 10 mg by mouth 2 (two) times daily.    [provider]  fexofenadine (ALLEGRA) 180 MG tablet Take 180 mg by mouth daily as needed for allergies or rhinitis.    [provider]  fluticasone (FLONASE) 50 MCG/ACT nasal spray Place 1-2 sprays into both nostrils daily as needed for allergies.   02/12/15   [provider]  isosorbide mononitrate (IMDUR) 30 MG 24 hr tablet Take 30 mg by mouth daily.    [provider]  levothyroxine (SYNTHROID, LEVOTHROID) 112 MCG tablet Take 112 mcg by mouth daily before breakfast.  01/10/18   [provider]  MAGNESIUM PO Take 400 mg by mouth daily.     [provider]  metoprolol succinate (TOPROL-XL) 50 MG 24 hr tablet Take 50 mg by mouth daily. Take with or immediately following a meal.    [provider]  Multiple Vitamin (MULTIVITAMIN WITH MINERALS) TABS tablet Take 1 tablet by mouth 2 (two) times a week.    [provider]  naproxen sodium (ALEVE) 220 MG tablet Take 440-660 mg by mouth 2 (two) times daily as needed (pain.).    [provider]  POTASSIUM PO Take 1 tablet by mouth daily.     [provider]  Psyllium (METAMUCIL MULTIHEALTH FIBER) 63 % POWD Take 1 Dose by mouth daily as needed (constipation.).    [provider]  tiZANidine (ZANAFLEX) 4 MG tablet TAKE ONE TABLET BY MOUTH EVERY 8 HOURS AS NEEDED FOR MUSCLE SPASMS 09/10/21   Mcarthur Rossetti, MD  TURMERIC PO Take 400 mg by mouth daily.     [provider]  valsartan-hydrochlorothiazide (DIOVAN-HCT) 320-25 MG per tablet Take 1 tablet by mouth daily.     [provider]  VYVANSE 70 MG capsule Take 70 mg by mouth daily as needed (ADD).  08/25/19   [provider]      Allergies    Patient has no known allergies.    Review of Systems   Review of Systems  Gastrointestinal:  Positive for abdominal pain.    Physical Exam Updated Vital Signs BP (!) 148/96   Pulse 78   Temp 97.9 F (36.6 C) (Oral)   Resp 16   SpO2 91%  Physical Exam Vitals and nursing note reviewed.  Constitutional:      Appearance: He is well-developed. He is not diaphoretic.  HENT:     Head: Normocephalic and atraumatic.     Right Ear: External ear normal.     Left Ear: External ear normal.   Eyes:     General: No scleral icterus.       Right eye: No discharge.        Left eye: No discharge.     Conjunctiva/sclera: Conjunctivae normal.  Neck:     Trachea: No tracheal deviation.  Cardiovascular:     Rate and Rhythm: Normal rate and regular rhythm.  Pulmonary:     Effort: Pulmonary effort is normal. No respiratory distress.     Breath sounds: Normal breath sounds. No stridor. No wheezing or rales.  Abdominal:     General: Bowel sounds are normal. There is no distension.     Palpations: Abdomen is soft.     Tenderness: There is abdominal tenderness in the right upper quadrant. There is guarding. There is no rebound.  Musculoskeletal:        General: No tenderness or deformity.     Cervical back: Neck supple.  Skin:    General: Skin is warm and dry.     Findings: No rash.  Neurological:     General: No focal deficit present.     Mental Status: He is alert.     Cranial Nerves: No cranial nerve deficit, dysarthria or facial asymmetry.     Sensory: No sensory deficit.     Motor: No abnormal muscle tone or seizure activity.     Coordination: Coordination normal.  Psychiatric:        Mood and Affect: Mood normal.     ED Results / Procedures / Treatments   Labs (all labs ordered are listed, but only abnormal results are displayed) Labs Reviewed  COMPREHENSIVE METABOLIC PANEL - Abnormal; Notable for the following components:      Result Value   Potassium 3.2 (*)    Glucose, Bld 106 (*)    All other components within normal limits  CBC WITH DIFFERENTIAL/PLATELET  LIPASE, BLOOD  URINALYSIS, ROUTINE W REFLEX MICROSCOPIC  TROPONIN I (HIGH SENSITIVITY)  TROPONIN I (HIGH SENSITIVITY)    EKG None  Radiology CT Angio Chest PE W and/or Wo Contrast  Addendum Date: 10/02/2022   ADDENDUM REPORT: 10/02/2022 13:06 ADDENDUM: Incidental note is made of cholelithiasis. Electronically Signed   By: Kathreen Devoid M.D.   On: 10/02/2022 13:06   Result Date: 10/02/2022 CLINICAL  DATA:  Right upper quadrant abdominal pain. EXAM: CT ANGIOGRAPHY CHEST WITH CONTRAST TECHNIQUE: Multidetector CT imaging of the chest was performed using the standard protocol during bolus administration of intravenous contrast. Multiplanar CT image reconstructions and MIPs were obtained to evaluate the vascular anatomy. RADIATION DOSE REDUCTION: This exam was performed according to the departmental dose-optimization program which includes automated exposure control, adjustment of the mA and/or kV according to patient size and/or  use of iterative reconstruction technique. CONTRAST:  71m OMNIPAQUE IOHEXOL 350 MG/ML SOLN COMPARISON:  None Available. FINDINGS: Cardiovascular: Satisfactory opacification of the pulmonary arteries to the segmental level. No evidence of pulmonary embolism. Normal heart size. No pericardial effusion. Mediastinum/Nodes: No enlarged mediastinal, hilar, or axillary lymph nodes. Thyroid gland, trachea, and esophagus demonstrate no significant findings. Lungs/Pleura: Lungs are clear. No pleural effusion or pneumothorax. Upper Abdomen: Diffuse low attenuation of the liver as can be seen with hepatic steatosis. 6.8 x 3.9 cm hypodense, fluid attenuating left hepatic mass likely reflecting a cyst. Musculoskeletal: No chest wall abnormality. No acute or significant osseous findings. Review of the MIP images confirms the above findings. IMPRESSION: 1. No pulmonary embolism or other acute cardiopulmonary disease. 2. Hepatic steatosis. 3. A 6.8 x 3.9 cm hypodense, fluid attenuating left hepatic mass likely reflecting a cyst. Electronically Signed: By: HKathreen DevoidM.D. On: 10/02/2022 12:33   UKoreaAbdomen Limited RUQ (LIVER/GB)  Result Date: 10/02/2022 CLINICAL DATA:  Right upper quadrant pain for 3 days EXAM: ULTRASOUND ABDOMEN LIMITED RIGHT UPPER QUADRANT COMPARISON:  None Available. FINDINGS: Gallbladder: Cholelithiasis measuring up to 1.4 cm. No gallbladder wall thickening or pericholecystic  fluid. No sonographic Murphy sign noted by sonographer. Common bile duct: Diameter: 4 mm Liver: Slightly nodular liver contour and increased hepatic parenchymal echogenicity/echotexture. There is a 6.6 x 5.8 x 3.6 cm anechoic lobulated mass in the left hepatic lobe. There is no internal blood flow on color Doppler examination. Portal vein is patent on color Doppler imaging with normal direction of blood flow towards the liver. Other: None. IMPRESSION: 1. Cholelithiasis without evidence of acute cholecystitis. 2. Slightly nodular liver contour and increased parenchymal echogenicity, which is not diagnostic but is suggestive of cirrhotic liver morphology. 3. There is a 6.6 cm lobulated anechoic mass in the left hepatic lobe, likely representing a benign simple cyst. However, consider further assessment with either CT cirrhosis protocol or MRI with and without contrast in the non-emergent setting. Electronically Signed   By: MBeryle FlockM.D.   On: 10/02/2022 09:51   DG Chest 2 View  Result Date: 10/02/2022 CLINICAL DATA:  Right upper quadrant pain EXAM: CHEST - 2 VIEW COMPARISON:  None Available. FINDINGS: The heart size and mediastinal contours are within normal limits. Both lungs are clear. The visualized skeletal structures are unremarkable. IMPRESSION: No active cardiopulmonary disease. Electronically Signed   By: HKathreen DevoidM.D.   On: 10/02/2022 08:45    Procedures Procedures    Medications Ordered in ED Medications  sodium chloride (PF) 0.9 % injection (has no administration in time range)  cefTRIAXone (ROCEPHIN) 2 g in sodium chloride 0.9 % 100 mL IVPB (has no administration in time range)  morphine (PF) 4 MG/ML injection 4 mg (4 mg Intravenous Given 10/02/22 1017)  ondansetron (ZOFRAN) injection 4 mg (4 mg Intravenous Given 10/02/22 1016)  iohexol (OMNIPAQUE) 350 MG/ML injection 80 mL (80 mLs Intravenous Contrast Given 10/02/22 1157)  morphine (PF) 4 MG/ML injection 4 mg (4 mg Intravenous  Given 10/02/22 1301)    ED Course/ Medical Decision Making/ A&P Clinical Course as of 10/02/22 1356  Fri Oct 02, 2022  1124 Chest x-ray without acute findings.  CBC normal.  Metabolic panel does show potassium 3.2 [JK]  1124 Ultrasound shows evidence of a mass in the left hepatic lobe.  Patient also has evidence of cholelithiasis without acute cholecystitis [JK]  1130 Notified the patient's oxygen saturation decreased.  Patient denies any respiratory symptoms.  I did switch  his blood pressure cuff to the opposite side but oxygen saturation did drop to the low 90s.  Will proceed with CT angio to rule out PE as a source of his pain and low oxygen level [JK]  1239 CT scan does not show any evidence of pulmonary embolism.  Patient does hepatic steatosis [JK]  1321 Discussed case with Gen surgery.  Will evaluate in ED [JK]    Clinical Course User Index [JK] Dorie Rank, MD                             Medical Decision Making Frontal diagnosis includes but not limited to biliary colic, pancreatitis, peptic ulcer disease, ureterolithiasis  Problems Addressed: Calculus of gallbladder without cholecystitis without obstruction: acute illness or injury that poses a threat to life or bodily functions Liver mass: undiagnosed new problem with uncertain prognosis  Amount and/or Complexity of Data Reviewed Labs: ordered. Decision-making details documented in ED Course. Radiology: ordered and independent interpretation performed.  Risk Prescription drug management. Decision regarding hospitalization.   Patient presented to the ED for evaluation of right upper quadrant pain presentation suggestive of possible biliary colic.  Patient's ultrasound shows evidence of gallstones but no findings of acute cholecystitis.  Patient however has required several doses of pain medications.  He has had the symptoms for a couple of days now.  Concerned about the possibility of evolving cholecystitis versus persistent  symptomatic biliary colic.  Have consulted with general surgery.  Dr. Irineo Axon and the surgery team evaluated the patient.  Will plan on taking him to the OR.  Did have an episode of transient hypoxemia.  With his right upper quadrant pain did consider the possibility of acute PE causing his symptoms.  CT angiogram was performed and fortunately no signs of PE.  Incidental liver mass noted on ultrasound and CT.  I do not think that is related to his acute symptoms.  Patient will require outpatient MRI follow-up.  Patient does not drink alcohol and does not have any lab abnormalities to suggest acute cirrhosis.        Final Clinical Impression(s) / ED Diagnoses Final diagnoses:  Calculus of gallbladder without cholecystitis without obstruction  Liver mass    Rx / DC Orders ED Discharge Orders     None         Dorie Rank, MD 10/02/22 1356

## 2022-10-02 NOTE — H&P (Signed)
Robert Blair [birth year]  [id number].    Chief Complaint/Reason for Consult: biliary colic  HPI:  This is a 67 yo male with a history of HTN, hypothryoidism, depression, basal cell carcinoma, and previous umbilical and RIH and redo who presented to the [facility] with 2 days of RUQ abdominal pain. Pain began after eating some lightly fried chicken and rice. He states this pain radiates to his back.  He denies any fevers, CP, SOB, diarrhea, dysuria, nausea, emesis etc.  He has been able to tolerate POs.  Due to persistent pain, he presented today for evaluation.  His WBC and LFTs are normal.  He has an [place] with gallstones but no wall thickening or pericholecystic fluid, no evidence of cholecystitis.  He was also noted to have a left hepatic lobe liver cyst which is likely to be benign as well as changes consistent with possible cirrhosis (although labs don't support this finding currently).  Due to a drop briefly in his O2 sat, a CTA was ordered to rule out PE or some other type of process.  This was negative and he denies SHOB.  Due to persistent symptoms despite morphine, we were called to see the patient.  ROS: ROS: see HPI  Family History  Problem Relation Age of Onset   Breast cancer Mother    Hypertension Mother    Depression Mother    Cancer - Other Mother        unknown GI cancer   Colon polyps Father    Stroke Father    Alzheimer's disease Father    Colon cancer Neg Hx    Rectal cancer Neg Hx    Stomach cancer Neg Hx    Liver disease Neg Hx     Past Medical History:  Diagnosis Date   Arthritis    Cancer ([place]) 2016   Basal cell   Complication of anesthesia    URINARY RETENTION   Depression    History of adenomatous polyp of colon    2008/  2013/  2016   History of basal cell carcinoma excision    2015  nose   History of urinary retention    10-30-2014  POST OP   Hypothyroidism    Seasonal allergies    Unspecified essential hypertension     Past Surgical History:   Procedure Laterality Date   ARTHRODESIS METATARSALPHALANGEAL JOINT (MTPJ) Right 08/13/2020   Procedure: FUSION RIGHT GREAT TOE METATARSALPHALANGEAL JOINT (MTPJ);  Surgeon: Newt Minion, MD;  Location: [place];  Service: Orthopedics;  Laterality: Right;   COLONOSCOPY  last one 02-25-2015   GANGLION CYST EXCISION Right 08/04/2021   Procedure: EXCISION MASS RIGHT MIDDLE FINGER;  Surgeon: Sherilyn Cooter, MD;  Location: [place];  Service: Orthopedics;  Laterality: Right;   HYDROCELE EXCISION Right 08/26/2015   Procedure: RIGHT HYDROCELECTOMY ADULT;  Surgeon: Franchot Gallo, MD;  Location: [facility];  Service: Urology;  Laterality: Right;   INGUINAL HERNIA REPAIR Right 10/29/2014   Procedure: LAPAROSCOPIC REPAIR RECURRENT RIGHT INGUINAL ;  Surgeon: Fanny Skates, MD;  Location: [facility];  Service: General;  Laterality: Right;   INGUINAL HERNIA REPAIR Right 1984   INSERTION OF MESH Right 10/29/2014   Procedure: INSERTION OF MESH;  Surgeon: Fanny Skates, MD;  Location: [facility];  Service: General;  Laterality: Right;   INSERTION OF MESH N/A 06/17/2020   Procedure: INSERTION OF MESH;  Surgeon: Coralie Keens, MD;  Location: [place];  Service: General;  Laterality: N/A;   POLYPECTOMY     TOE OSTEOTOMY Bilateral right 2002/  left 2013   great toe   TOTAL HIP ARTHROPLASTY Left 09/22/2019   Procedure: LEFT TOTAL HIP ARTHROPLASTY ANTERIOR APPROACH;  Surgeon: Mcarthur Rossetti, MD;  Location: [facility];  Service: Orthopedics;  Laterality: Left;   UMBILICAL HERNIA REPAIR N/A 06/17/2020   Procedure: UMBILICAL HERNIA REPAIR;  Surgeon: Coralie Keens, MD;  Location: [place];  Service: General;  Laterality: N/A;    Social History:  reports that he has never smoked. He has never used smokeless tobacco. He reports current alcohol use. He reports that he does not use drugs.  Allergies: No Known Allergies  (Not  in a hospital admission)    Physical Exam: Blood pressure (!) 148/96, pulse 78, temperature 97.9 F (36.6 C), temperature source Oral, resp. rate 16, SpO2 91 %. General: pleasant, WD, WN white male who is laying in bed in NAD  HEENT: head is normocephalic, atraumatic.  Sclera are noninjected.  PERRL.  Ears and nose without any masses or lesions.  Mouth is pink and moist Heart: regular, rate, and rhythm. Lungs: Respiratory effort nonlabored on room air Abd: soft, ND, +BS, no masses, hernias, or organomegaly. Mildly TTP in RUQ without rebound or guarding MS: all 4 extremities are symmetrical with no cyanosis, clubbing, or edema. Skin: warm and dry with no masses, lesions, or rashes Neuro: Cranial nerves 2-12 grossly intact, sensation is normal throughout Psych: A&Ox3 with an appropriate affect.   Results for orders placed or performed during the hospital encounter of 10/02/22 (from the past 48 hour(s))  Troponin I (High Sensitivity)     Status: None   Collection Time: 10/02/22  7:45 AM  Result Value Ref Range   Troponin I (High Sensitivity) 5 <18 ng/L    Comment: (NOTE) Elevated high sensitivity troponin I (hsTnI) values and significant  changes across serial measurements may suggest ACS but many other  chronic and acute conditions are known to elevate hsTnI results.  Refer to the "Links" section for chest pain algorithms and additional  guidance. Performed at [facility], [place] [address] [place], [place] [address]   CBC with Differential     Status: None   Collection Time: 10/02/22  8:06 AM  Result Value Ref Range   WBC 9.0 4.0 - 10.5 K/uL   RBC 5.56 4.22 - 5.81 MIL/uL   Hemoglobin 16.9 13.0 - 17.0 g/dL   HCT 49.6 39.0 - 52.0 %   MCV 89.2 80.0 - 100.0 fL   MCH 30.4 26.0 - 34.0 pg   MCHC 34.1 30.0 - 36.0 g/dL   RDW 12.9 11.5 - 15.5 %   Platelets 250 150 - 400 K/uL   nRBC 0.0 0.0 - 0.2 %   Neutrophils Relative % 64 %   Neutro Abs 5.8 1.7 - 7.7 K/uL    Lymphocytes Relative 26 %   Lymphs Abs 2.3 0.7 - 4.0 K/uL   Monocytes Relative 7 %   Monocytes Absolute 0.6 0.1 - 1.0 K/uL   Eosinophils Relative 2 %   Eosinophils Absolute 0.2 0.0 - 0.5 K/uL   Basophils Relative 0 %   Basophils Absolute 0.0 0.0 - 0.1 K/uL   Immature Granulocytes 1 %   Abs Immature Granulocytes 0.05 0.00 - 0.07 K/uL    Comment: Performed at [facility], [place] [address], [place], [place] [address]  Comprehensive metabolic panel     Status: Abnormal  Collection Time: 10/02/22  8:06 AM  Result Value Ref Range   Sodium 136 135 - 145 mmol/L   Potassium 3.2 (L) 3.5 - 5.1 mmol/L   Chloride 102 98 - 111 mmol/L   CO2 26 22 - 32 mmol/L   Glucose, Bld 106 (H) 70 - 99 mg/dL    Comment: Glucose reference range applies only to samples taken after fasting for at least 8 hours.   BUN 20 8 - 23 mg/dL   Creatinine, Ser 0.94 0.61 - 1.24 mg/dL   Calcium 9.0 8.9 - 10.3 mg/dL   Total Protein 7.2 6.5 - 8.1 g/dL   Albumin 4.2 3.5 - 5.0 g/dL   AST 31 15 - 41 U/L   ALT 39 0 - 44 U/L   Alkaline Phosphatase 51 38 - 126 U/L   Total Bilirubin 0.7 0.3 - 1.2 mg/dL   GFR, Estimated >60 >60 mL/min    Comment: (NOTE) Calculated using the CKD-EPI Creatinine Equation (2021)    Anion gap 8 5 - 15    Comment: Performed at [facility], [place] [address], [place], [place] [address]  Lipase, blood     Status: None   Collection Time: 10/02/22  8:06 AM  Result Value Ref Range   Lipase 33 11 - 51 U/L    Comment: Performed at [facility], [place] [address], [place], [place] [address]  Troponin I (High Sensitivity)     Status: None   Collection Time: 10/02/22  9:45 AM  Result Value Ref Range   Troponin I (High Sensitivity) 5 <18 ng/L    Comment: (NOTE) Elevated high sensitivity troponin I (hsTnI) values and significant  changes across serial measurements may suggest ACS but many other  chronic and acute conditions are known to elevate hsTnI  results.  Refer to the "Links" section for chest pain algorithms and additional  guidance. Performed at [facility], [place] [address] [place], [place] [address]    CT Angio Chest PE W and/or Wo Contrast  Addendum Date: 10/02/2022   ADDENDUM REPORT: 10/02/2022 13:06 ADDENDUM: Incidental note is made of cholelithiasis. Electronically Signed   By: Kathreen Devoid M.D.   On: 10/02/2022 13:06   Result Date: 10/02/2022 CLINICAL DATA:  Right upper quadrant abdominal pain. EXAM: CT ANGIOGRAPHY CHEST WITH CONTRAST TECHNIQUE: Multidetector CT imaging of the chest was performed using the standard protocol during bolus administration of intravenous contrast. Multiplanar CT image reconstructions and MIPs were obtained to evaluate the vascular anatomy. RADIATION DOSE REDUCTION: This exam was performed according to the departmental dose-optimization program which includes automated exposure control, adjustment of the mA and/or kV according to patient size and/or use of iterative reconstruction technique. CONTRAST:  4m OMNIPAQUE IOHEXOL 350 MG/ML SOLN COMPARISON:  None Available. FINDINGS: Cardiovascular: Satisfactory opacification of the pulmonary arteries to the segmental level. No evidence of pulmonary embolism. Normal heart size. No pericardial effusion. Mediastinum/Nodes: No enlarged mediastinal, hilar, or axillary lymph nodes. Thyroid gland, trachea, and esophagus demonstrate no significant findings. Lungs/Pleura: Lungs are clear. No pleural effusion or pneumothorax. Upper Abdomen: Diffuse low attenuation of the liver as can be seen with hepatic steatosis. 6.8 x 3.9 cm hypodense, fluid attenuating left hepatic mass likely reflecting a cyst. Musculoskeletal: No chest wall abnormality. No acute or significant osseous findings. Review of the MIP images confirms the above findings. IMPRESSION: 1. No pulmonary embolism or other acute cardiopulmonary disease. 2. Hepatic steatosis. 3. A 6.8 x 3.9 cm  hypodense, fluid attenuating left  hepatic mass likely reflecting a cyst. Electronically Signed: By: Kathreen Devoid M.D. On: 10/02/2022 12:33   US Abdomen Limited RUQ (LIVER/GB)  Result Date: 10/02/2022 CLINICAL DATA:  Right upper quadrant pain for 3 days EXAM: ULTRASOUND ABDOMEN LIMITED RIGHT UPPER QUADRANT COMPARISON:  None Available. FINDINGS: Gallbladder: Cholelithiasis measuring up to 1.4 cm. No gallbladder wall thickening or pericholecystic fluid. No sonographic Murphy sign noted by sonographer. Common bile duct: Diameter: 4 mm Liver: Slightly nodular liver contour and increased hepatic parenchymal echogenicity/echotexture. There is a 6.6 x 5.8 x 3.6 cm anechoic lobulated mass in the left hepatic lobe. There is no internal blood flow on color Doppler examination. Portal vein is patent on color Doppler imaging with normal direction of blood flow towards the liver. Other: None. IMPRESSION: 1. Cholelithiasis without evidence of acute cholecystitis. 2. Slightly nodular liver contour and increased parenchymal echogenicity, which is not diagnostic but is suggestive of cirrhotic liver morphology. 3. There is a 6.6 cm lobulated anechoic mass in the left hepatic lobe, likely representing a benign simple cyst. However, consider further assessment with either CT cirrhosis protocol or MRI with and without contrast in the non-emergent setting. Electronically Signed   By: Beryle Flock M.D.   On: 10/02/2022 09:51   DG Chest 2 View  Result Date: 10/02/2022 CLINICAL DATA:  Right upper quadrant pain EXAM: CHEST - 2 VIEW COMPARISON:  None Available. FINDINGS: The heart size and mediastinal contours are within normal limits. Both lungs are clear. The visualized skeletal structures are unremarkable. IMPRESSION: No active cardiopulmonary disease. Electronically Signed   By: Kathreen Devoid M.D.   On: 10/02/2022 08:45      Assessment/Plan Biliary Colic The patient has been seen, examined, labs, chart, vitals, and imaging  reviewed.  He does not appear to have evidence of cholecystitis on imaging or lab data, but he continues to have abdominal pain. We recommend proceeding to the OR for lap chole and likely DC home from PACU.  This was discussed with the patient.  I have explained the procedure, risks, and aftercare of cholecystectomy.  Risks include but are not limited to bleeding, infection, wound problems, anesthesia, diarrhea, bile leak, injury to common bile duct/liver/intestine.  He seems to understand and agrees to proceed.   FEN - NPO VTE - OR/SCDs ID - Rocephin on call Admit - likely DC home from PACU  I reviewed ED provider notes, last 24 h vitals and pain scores, last 48 h intake and output, last 24 h labs and trends, and last 24 h imaging results.  Richard Miu, [facility] 10/02/2022, 1:42 PM Please see Amion for pager number during day hours 7:00am-4:30pm or 7:00am -11:30am on weekends

## 2022-10-02 NOTE — Anesthesia Postprocedure Evaluation (Signed)
Anesthesia Post Note  Patient: Robert Blair  Procedure(s) Performed: LAPAROSCOPIC CHOLECYSTECTOMY     Patient location during evaluation: PACU Anesthesia Type: General Level of consciousness: sedated and patient cooperative Pain management: pain level controlled Vital Signs Assessment: post-procedure vital signs reviewed and stable Respiratory status: spontaneous breathing Cardiovascular status: stable Anesthetic complications: no   No notable events documented.  Last Vitals:  Vitals:   10/02/22 1615 10/02/22 1635  BP: 131/89 127/84  Pulse: 72 72  Resp: 15 16  Temp: 36.9 C 36.7 C  SpO2: 93% 92%    Last Pain:  Vitals:   10/02/22 1635  TempSrc:   PainSc: 0-No pain                 Nolon Nations

## 2022-10-02 NOTE — Anesthesia Preprocedure Evaluation (Addendum)
Anesthesia Evaluation  Patient identified by MRN, date of birth, ID band Patient awake  General Assessment Comment:Urinary retention with hernia repair, no other surgeries  Reviewed: Allergy & Precautions, H&P , NPO status , Patient's Chart, lab work & pertinent test results, Unable to perform ROS - Chart review only  History of Anesthesia Complications (+) history of anesthetic complications  Airway Mallampati: II  TM Distance: >3 FB Neck ROM: Full    Dental no notable dental hx. (+) Teeth Intact, Dental Advisory Given   Pulmonary neg pulmonary ROS   Pulmonary exam normal breath sounds clear to auscultation       Cardiovascular hypertension, Pt. on medications and Pt. on home beta blockers Normal cardiovascular exam Rhythm:Regular Rate:Normal     Neuro/Psych  PSYCHIATRIC DISORDERS  Depression    negative neurological ROS     GI/Hepatic negative GI ROS, Neg liver ROS,,,  Endo/Other  Hypothyroidism  Obesity BMI 35  Renal/GU negative Renal ROS  negative genitourinary   Musculoskeletal  (+) Arthritis , Osteoarthritis,    Abdominal  (+) + obese  Peds negative pediatric ROS (+)  Hematology negative hematology ROS (+) plt 299   Anesthesia Other Findings   Reproductive/Obstetrics negative OB ROS                             Anesthesia Physical Anesthesia Plan  ASA: 3  Anesthesia Plan: General   Post-op Pain Management: Tylenol PO (pre-op)*   Induction: Intravenous  PONV Risk Score and Plan: 3 and Treatment may vary due to age or medical condition, Midazolam, Ondansetron and Dexamethasone  Airway Management Planned: Oral ETT  Additional Equipment: None  Intra-op Plan:   Post-operative Plan: Extubation in OR  Informed Consent: I have reviewed the patients History and Physical, chart, labs and discussed the procedure including the risks, benefits and alternatives for the proposed  anesthesia with the patient or authorized representative who has indicated his/her understanding and acceptance.     Dental advisory given  Plan Discussed with: CRNA  Anesthesia Plan Comments:         Anesthesia Quick Evaluation

## 2022-10-02 NOTE — Transfer of Care (Signed)
Immediate Anesthesia Transfer of Care Note  Patient: Robert Blair  Procedure(s) Performed: LAPAROSCOPIC CHOLECYSTECTOMY  Patient Location: PACU  Anesthesia Type:General  Level of Consciousness: awake and alert   Airway & Oxygen Therapy: Patient Spontanous Breathing and Patient connected to face mask oxygen  Post-op Assessment: Report given to RN and Post -op Vital signs reviewed and stable  Post vital signs: Reviewed and stable  Last Vitals:  Vitals Value Taken Time  BP 130/86 10/02/22 1544  Temp    Pulse 71 10/02/22 1545  Resp 5 10/02/22 1545  SpO2 97 % 10/02/22 1545  Vitals shown include unvalidated device data.  Last Pain:  Vitals:   10/02/22 1432  TempSrc:   PainSc: 0-No pain         Complications: No notable events documented.

## 2022-10-02 NOTE — Op Note (Signed)
10/02/2022  3:27 PM  PATIENT:  Robert Blair  67 y.o. male  PRE-OPERATIVE DIAGNOSIS:  GALLSTONES  POST-OPERATIVE DIAGNOSIS:  GALLSTONES, CHRONIC CHOLECYSTITIS  PROCEDURE:  Procedure(s): LAPAROSCOPIC CHOLECYSTECTOMY (N/A)  SURGEON:  Surgeon(s) and Role:    Ralene Ok, MD - Primary  PHYSICIAN ASSISTANT: Saverio Danker, PA-C    ANESTHESIA:   local and general  EBL:  minimal   BLOOD ADMINISTERED:none  DRAINS: none   LOCAL MEDICATIONS USED:  BUPIVICAINE   SPECIMEN:  Source of Specimen:  gallbladder  DISPOSITION OF SPECIMEN:  PATHOLOGY  COUNTS:  YES  TOURNIQUET:  * No tourniquets in log *  DICTATION: .Dragon Dictation  The patient was taken to the operating and placed in the supine position with bilateral SCDs in place.  The patient was prepped and draped in the usual sterile fashion. A time out was called and all facts were verified. A pneumoperitoneum was obtained via A Veress needle technique to a pressure of 77m of mercury.  A 564mtrochar was then placed in the right upper quadrant under visualization, and there were no injuries to any abdominal organs. A 11 mm port was then placed in the umbilical region after infiltrating with local anesthesia under direct visualization. A second and third epigastric port and right lower quadrant port placement under direct visualization, respectively.    The gallbladder was identified and retracted, the peritoneum was then sharply dissected from the gallbladder and this dissection was carried down to Calot's triangle. The cystic duct was identified and stripped away circumferentially and seen going into the gallbladder 360, the critical angle was obtained.  2 clips were placed proximally one distally and the cystic duct transected. The cystic artery was identified and 2 clips placed proximally and one distally and transected.  We then proceeded to remove the gallbladder off the hepatic fossa with Bovie cautery. A retrieval bag  was then placed in the abdomen and gallbladder placed in the bag. The hepatic fossa was then reexamined and hemostasis was achieved with Bovie cautery and was excellent at the end of the case.   The subhepatic fossa and perihepatic fossa was then irrigated until the effluent was clear.  The gallbladder and bag were removed from the abdominal cavity. The 11 mm trocar fascia was reapproximated with the Endo Close #1 Vicryl x2.  The pneumoperitoneum was evacuated and all trochars removed under direct visulalization.  The skin was then closed with 4-0 Monocryl and the skin dressed with Dermabond.    The patient was awaken from general anesthesia and taken to the recovery room in stable condition.    PLAN OF CARE: Discharge to home after PACU  PATIENT DISPOSITION:  PACU - hemodynamically stable.   Delay start of Pharmacological VTE agent (>24hrs) due to surgical blood loss or risk of bleeding: not applicable

## 2022-10-02 NOTE — Discharge Instructions (Addendum)
Follow-up with your primary doctor to arrange for an outpatient MRI of your liver.  [facility]  Please arrive at least 30 min before your appointment to complete your check in paperwork.  If you are unable to arrive 30 min prior to your appointment time we may have to cancel or reschedule you. LAPAROSCOPIC SURGERY: POST OP INSTRUCTIONS Always review your discharge instruction sheet given to you by the facility where your surgery was performed. IF YOU HAVE DISABILITY OR FAMILY LEAVE FORMS, YOU MUST BRING THEM TO THE OFFICE FOR PROCESSING.   DO NOT GIVE THEM TO YOUR DOCTOR.  PAIN CONTROL  First take acetaminophen (Tylenol) AND/or ibuprofen (Advil) to control your pain after surgery.  Follow directions on package.  Taking acetaminophen (Tylenol) and/or ibuprofen (Advil) regularly after surgery will help to control your pain and lower the amount of prescription pain medication you may need.  You should not take more than 4,000 mg (4 grams) of acetaminophen (Tylenol) in 24 hours.  You should not take ibuprofen (Advil), aleve, motrin, naprosyn or other NSAIDS if you have a history of stomach ulcers or chronic kidney disease.  A prescription for pain medication may be given to you upon discharge.  Take your pain medication as prescribed, if you still have uncontrolled pain after taking acetaminophen (Tylenol) or ibuprofen (Advil). Use ice packs to help control pain. If you need a refill on your pain medication, please contact your pharmacy.  They will contact our office to request authorization. Prescriptions will not be filled after 5pm or on week-ends.  HOME MEDICATIONS Take your usually prescribed medications unless otherwise directed.  DIET You should follow a light diet the first few days after arrival home.  Be sure to include lots of fluids daily. Avoid fatty, fried foods.   CONSTIPATION It is common to experience some constipation after surgery and if you are taking  pain medication.  Increasing fluid intake and taking a stool softener (such as Colace) will usually help or prevent this problem from occurring.  A mild laxative (Milk of Magnesia or Miralax) should be taken according to package instructions if there are no bowel movements after 48 hours.  WOUND/INCISION CARE Most patients will experience some swelling and bruising in the area of the incisions.  Ice packs will help.  Swelling and bruising can take several days to resolve.  Unless discharge instructions indicate otherwise, follow guidelines below  STERI-STRIPS - you may remove your outer bandages 48 hours after surgery, and you may shower at that time.  You have steri-strips (small skin tapes) in place directly over the incision.  These strips should be left on the skin for 7-10 days.   DERMABOND/SKIN GLUE - you may shower in 24 hours.  The glue will flake off over the next 2-3 weeks. Any sutures or staples will be removed at the office during your follow-up visit.  ACTIVITIES You may resume regular (light) daily activities beginning the next day--such as daily self-care, walking, climbing stairs--gradually increasing activities as tolerated.  You may have sexual intercourse when it is comfortable.  Refrain from any heavy lifting or straining until approved by your doctor. You may drive when you are no longer taking prescription pain medication, you can comfortably wear a seatbelt, and you can safely maneuver your car and apply brakes.  FOLLOW-UP You should see your doctor in the office for a follow-up appointment approximately 2-3 weeks after your surgery.  You should have been given your post-op/follow-up appointment when  your surgery was scheduled.  If you did not receive a post-op/follow-up appointment, make sure that you call for this appointment within a day or two after you arrive home to insure a convenient appointment time.   WHEN TO CALL YOUR DOCTOR: Fever over 101.0 Inability to  urinate Continued bleeding from incision. Increased pain, redness, or drainage from the incision. Increasing abdominal pain  The clinic staff is available to answer your questions during regular business hours.  Please don't hesitate to call and ask to speak to one of the nurses for clinical concerns.  If you have a medical emergency, go to the nearest emergency room or call 911.  A surgeon from [facility] is always on call at the hospital. [address] [place], [place], [place]  [address] ? [address], [place], [place]   [address] [phone] ? [phone] ? FAX [phone]     Managing Your Pain After Surgery Without Opioids    Thank you for participating in our program to help patients manage their pain after surgery without opioids. This is part of our effort to provide you with the best care possible, without exposing you or your family to the risk that opioids pose.  What pain can I expect after surgery? You can expect to have some pain after surgery. This is normal. The pain is typically worse the day after surgery, and quickly begins to get better. Many studies have found that many patients are able to manage their pain after surgery with Over-the-Counter (OTC) medications such as Tylenol and Motrin. If you have a condition that does not allow you to take Tylenol or Motrin, notify your surgical team.  How will I manage my pain? The best strategy for controlling your pain after surgery is around the clock pain control with Tylenol (acetaminophen) and Motrin (ibuprofen or Advil). Alternating these medications with each other allows you to maximize your pain control. In addition to Tylenol and Motrin, you can use heating pads or ice packs on your incisions to help reduce your pain.  How will I alternate your regular strength over-the-counter pain medication? You will take a dose of pain medication every three hours. Start by taking 650 mg of Tylenol (2  pills of 325 mg) 3 hours later take 600 mg of Motrin (3 pills of 200 mg) 3 hours after taking the Motrin take 650 mg of Tylenol 3 hours after that take 600 mg of Motrin.   - 1 -  See example - if your first dose of Tylenol is at 12:00 PM   12:00 PM Tylenol 650 mg (2 pills of 325 mg)  3:00 PM Motrin 600 mg (3 pills of 200 mg)  6:00 PM Tylenol 650 mg (2 pills of 325 mg)  9:00 PM Motrin 600 mg (3 pills of 200 mg)  Continue alternating every 3 hours   We recommend that you follow this schedule around-the-clock for at least 3 days after surgery, or until you feel that it is no longer needed. Use the table on the last page of this handout to keep track of the medications you are taking. Important: Do not take more than '3000mg'$  of Tylenol or '3200mg'$  of Motrin in a 24-hour period. Do not take ibuprofen/Motrin if you have a history of bleeding stomach ulcers, severe kidney disease, &/or actively taking a blood thinner  What if I still have pain? If you have pain that is not controlled with the over-the-counter pain medications (Tylenol and Motrin  or Advil) you might have what we call "breakthrough" pain. You will receive a prescription for a small amount of an opioid pain medication such as Oxycodone, Tramadol, or Tylenol with Codeine. Use these opioid pills in the first 24 hours after surgery if you have breakthrough pain. Do not take more than 1 pill every 4-6 hours.  If you still have uncontrolled pain after using all opioid pills, don't hesitate to call our staff using the number provided. We will help make sure you are managing your pain in the best way possible, and if necessary, we can provide a prescription for additional pain medication.   Day 1    Time  Name of Medication Number of pills taken  Amount of Acetaminophen  Pain Level   Comments  AM PM       AM PM       AM PM       AM PM       AM PM       AM PM       AM PM       AM PM       Total Daily amount of  Acetaminophen Do not take more than  3,000 mg per day      Day 2    Time  Name of Medication Number of pills taken  Amount of Acetaminophen  Pain Level   Comments  AM PM       AM PM       AM PM       AM PM       AM PM       AM PM       AM PM       AM PM       Total Daily amount of Acetaminophen Do not take more than  3,000 mg per day      Day 3    Time  Name of Medication Number of pills taken  Amount of Acetaminophen  Pain Level   Comments  AM PM       AM PM       AM PM       AM PM         AM PM       AM PM       AM PM       AM PM       Total Daily amount of Acetaminophen Do not take more than  3,000 mg per day      Day 4    Time  Name of Medication Number of pills taken  Amount of Acetaminophen  Pain Level   Comments  AM PM       AM PM       AM PM       AM PM       AM PM       AM PM       AM PM       AM PM       Total Daily amount of Acetaminophen Do not take more than  3,000 mg per day      Day 5    Time  Name of Medication Number of pills taken  Amount of Acetaminophen  Pain Level   Comments  AM PM       AM PM       AM PM       AM PM  AM PM       AM PM       AM PM       AM PM       Total Daily amount of Acetaminophen Do not take more than  3,000 mg per day      Day 6    Time  Name of Medication Number of pills taken  Amount of Acetaminophen  Pain Level  Comments  AM PM       AM PM       AM PM       AM PM       AM PM       AM PM       AM PM       AM PM       Total Daily amount of Acetaminophen Do not take more than  3,000 mg per day      Day 7    Time  Name of Medication Number of pills taken  Amount of Acetaminophen  Pain Level   Comments  AM PM       AM PM       AM PM       AM PM       AM PM       AM PM       AM PM       AM PM       Total Daily amount of Acetaminophen Do not take more than  3,000 mg per day        For additional information about how and where to safely dispose  of unused opioid medications - [employer]  Disclaimer: This document contains information and/or instructional materials adapted from [place] for the typical patient with your condition. It does not replace medical advice from your health care provider because your experience may differ from that of the typical patient. Talk to your health care provider if you have any questions about this document, your condition or your treatment plan. Adapted from [place]

## 2022-10-03 ENCOUNTER — Encounter (HOSPITAL_COMMUNITY): Payer: Self-pay | Admitting: General Surgery

## 2022-10-05 LAB — SURGICAL PATHOLOGY

## 2022-11-10 IMAGING — MR MR [PERSON_NAME]*[PERSON_NAME]* WO/W CM
8 series · 40 of 40 positions shown · IV contrast (MULTIHANCE)
Comparison: Hand radiograph 06/16/2021.

CLINICAL DATA: Soft tissue mass, hand, US/xray nondiagnostic Right
middle finger mass

EXAM:
MRI OF THE RIGHT FINGERS WITHOUT AND WITH CONTRAST
TECHNIQUE: Multiplanar, multisequence MR imaging of the right middle finger was
performed before and after the administration of intravenous
contrast.
CONTRAST:  20mL MULTIHANCE GADOBENATE DIMEGLUMINE 529 MG/ML IV SOLN

[Series 4: t1_ax · axial · right · 3.0mm · 0.38mm/px · z∈[-9,+111]mm · 7 of 35 slices shown]
[im 1/35]
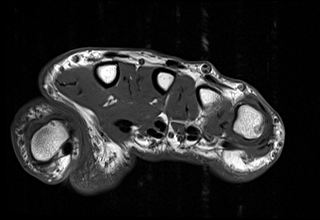
[im 6/35]
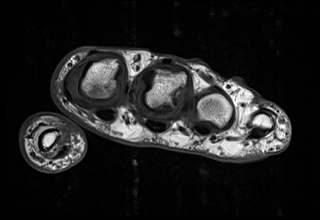
[im 12/35]
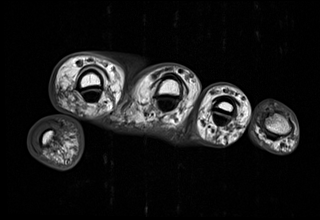
[im 18/35]
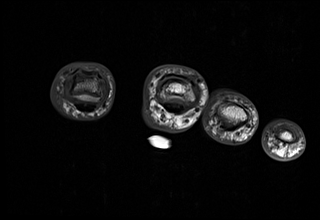
[im 23/35]
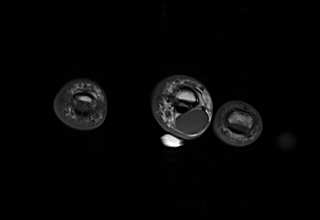
[im 29/35]
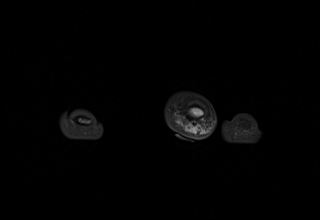
[im 35/35]
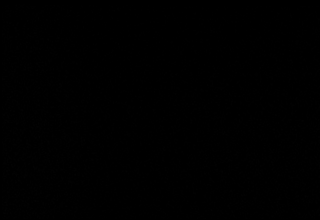

[Series 5: PD fat-sat · sagittal · right · 3.0mm · 0.30mm/px · 3 of 14 slices shown]
[im 1/14]
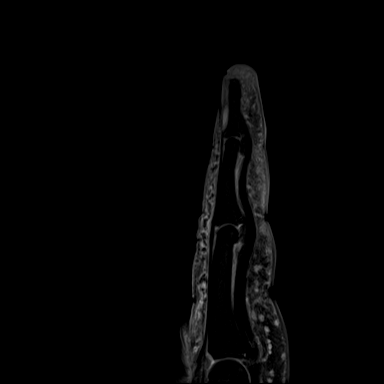
[im 7/14]
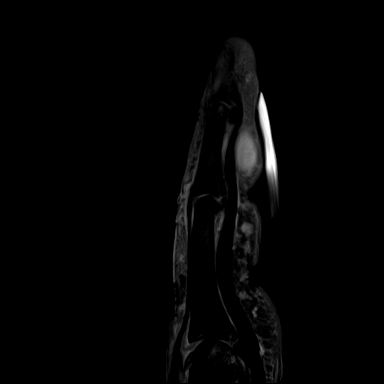
[im 14/14]
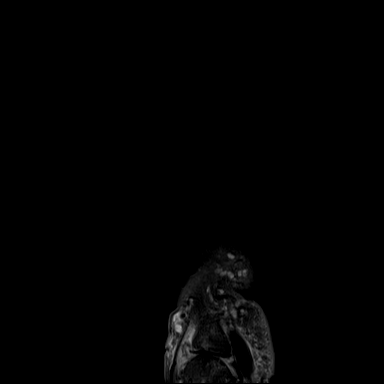

[Series 6: T2 fat-sat · axial · right · 3.0mm · 0.38mm/px · z∈[-10,+111]mm · 7 of 35 slices shown (1 of 2)]
[im 1/35]
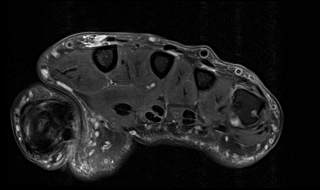
[im 6/35]
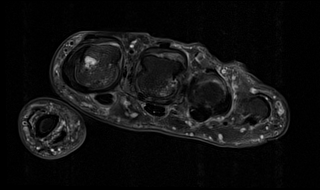
[im 12/35]
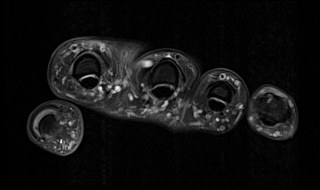
[im 18/35]
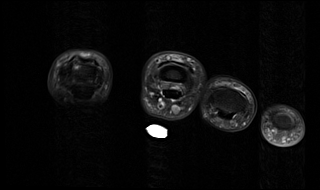
[im 23/35]
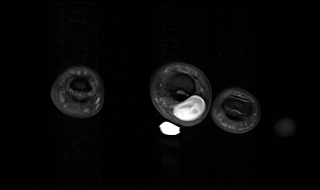
[im 29/35]
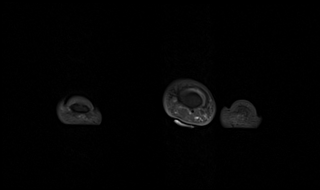
[im 35/35]
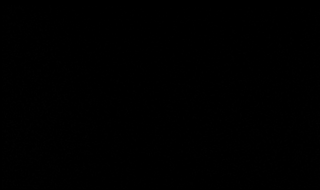

[Series 7: T2 fat-sat · coronal · right · 2.0mm · 0.22mm/px · 3 of 13 slices shown (2 of 2)]
[im 1/13]
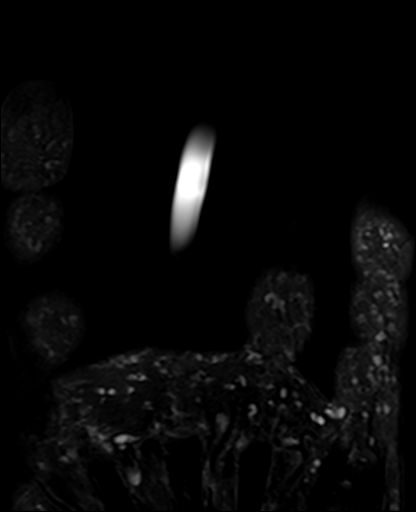
[im 7/13]
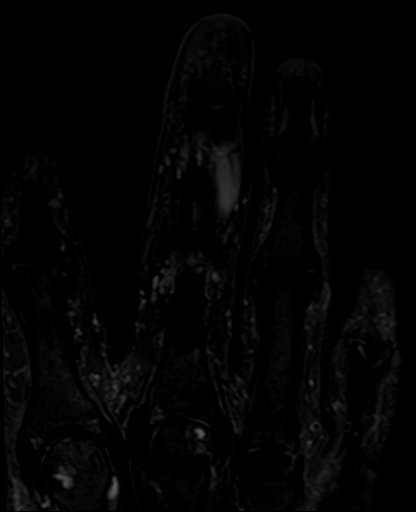
[im 13/13]
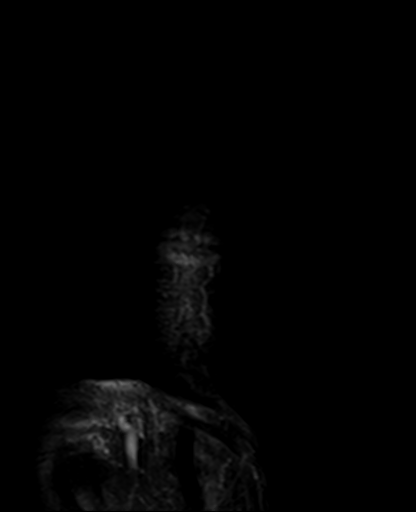

[Series 8: T1 · coronal · right · 2.0mm · 0.49mm/px · 3 of 13 slices shown]
[im 1/13]
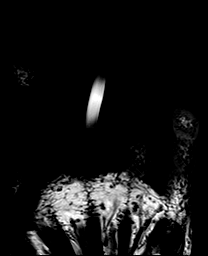
[im 7/13]
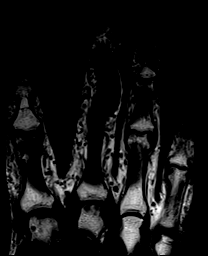
[im 13/13]
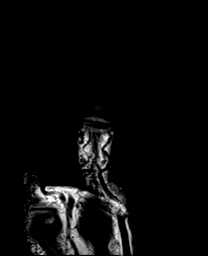

[Series 9: t1_ax fs pre · axial · non-contrast · right · 2.0mm · 0.23mm/px · z∈[+41,+105]mm · 6 of 31 slices shown]
[im 1/31]
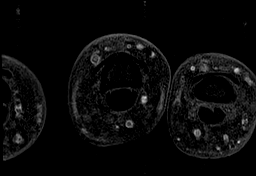
[im 7/31]
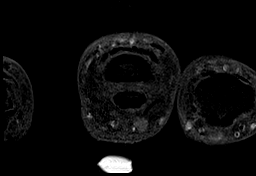
[im 13/31]
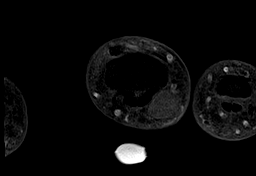
[im 19/31]
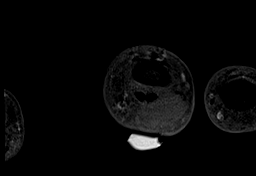
[im 25/31]
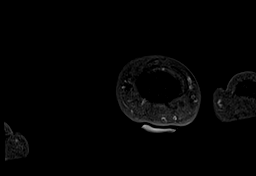
[im 31/31]
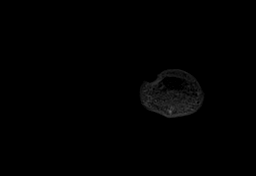

[Series 10: t1_ax fs post · axial · right · 2.0mm · 0.23mm/px · z∈[+41,+105]mm · 6 of 31 slices shown]
[im 1/31]
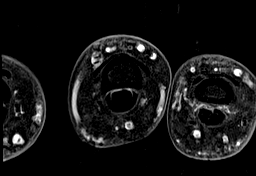
[im 7/31]
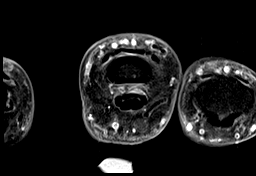
[im 13/31]
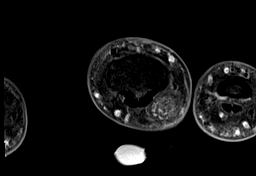
[im 19/31]
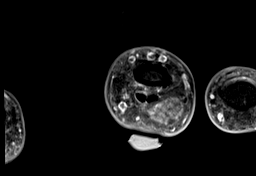
[im 25/31]
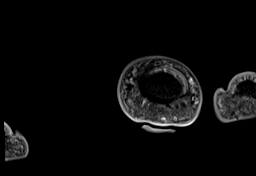
[im 31/31]
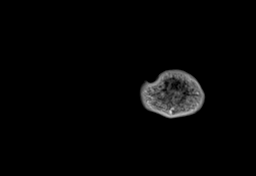

[Series 11: T1 fat-sat · sagittal · right · 1.0mm · 0.35mm/px · 5 of 26 slices shown]
[im 1/26]
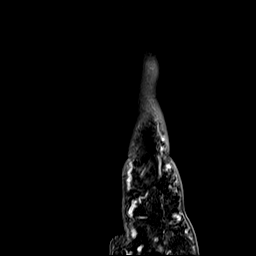
[im 7/26]
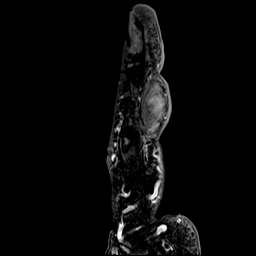
[im 13/26]
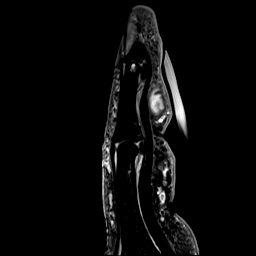
[im 19/26]
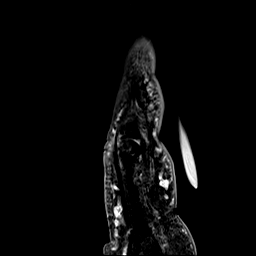
[im 26/26]
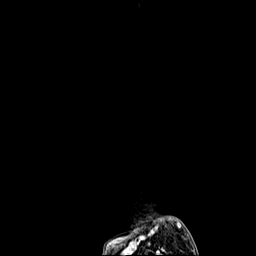

[40 of 40 positions shown; findings below may reference images not displayed]

FINDINGS: Bones/Joint/Cartilage

There is second and third MCP joint degenerative change with
subchondral cyst formation. No acute fracture.

Ligaments

Intact collateral ligaments.  No evidence of pulley injury.

Muscles and Tendons

No acute tendon tear.  No tenosynovitis.  No muscle atrophy.

Soft tissues

Within the volar soft tissues of the middle finger at the level of
the middle phalanx, there is a T2 hyperintense/T1 hypointense
heterogeneously enhancing mass measuring 1.3 x 0.8 x 2.2 cm (axial
T2 image 12, coronal T2 image 5). This mass abuts the flexor tendon
and ulnar sided neurovascular bundle without encasement.
IMPRESSION: 1.3 x 0.8 x 2.2 cm heterogeneously enhancing mass along the volar
ulnar aspect of the middle finger, which abuts the flexor tendon and
ulnar sided neurovascular bundle without encasement. No osseous
involvement. Recommend excision for definitive tissue diagnosis and
to exclude sarcoma.

## 2022-11-25 ENCOUNTER — Encounter: Payer: Self-pay | Admitting: Internal Medicine

## 2023-01-21 ENCOUNTER — Ambulatory Visit (AMBULATORY_SURGERY_CENTER): Payer: BC Managed Care – PPO

## 2023-01-21 ENCOUNTER — Encounter: Payer: Self-pay | Admitting: Internal Medicine

## 2023-01-21 VITALS — Ht 67.0 in | Wt 220.0 lb

## 2023-01-21 DIAGNOSIS — Z86010 Personal history of colonic polyps: Secondary | ICD-10-CM

## 2023-01-21 MED ORDER — NA SULFATE-K SULFATE-MG SULF 17.5-3.13-1.6 GM/177ML PO SOLN: 1.0000 | Freq: Once | ORAL | 0 refills | Status: AC

## 2023-01-21 NOTE — Progress Notes (Signed)
No egg or soy allergy known to patient  No issues known to pt with past sedation with any surgeries or procedures - urinary retention with anesthesia  Patient denies ever being told they had issues or difficulty with intubation  No FH of Malignant Hyperthermia Pt is not on diet pills Pt is not on  home 02  Pt is not on blood thinners  Pt with constipation does take ex-lax and stool softener OTC pt reports no issues  No A fib or A flutter Have any cardiac testing pending-- no  Pt is ambulatory   Patient's chart reviewed by Cathlyn Parsons CNRA prior to previsit and patient appropriate for the [facility].  Previsit completed and red dot placed by patient's name on their procedure day (on provider's schedule).      PV complete. Pt to call the office if he has any concerns regarding his prep. Rx sent to [facility] on file. Coupon sent with paper work. Pt instructed to use [employer] or GoodRx for a price reduction on prep

## 2023-02-11 ENCOUNTER — Encounter: Payer: Self-pay | Admitting: Certified Registered Nurse Anesthetist

## 2023-02-12 ENCOUNTER — Encounter: Payer: Self-pay | Admitting: Physician Assistant

## 2023-02-12 ENCOUNTER — Ambulatory Visit: Payer: BC Managed Care – PPO | Admitting: Physician Assistant

## 2023-02-12 ENCOUNTER — Other Ambulatory Visit (INDEPENDENT_AMBULATORY_CARE_PROVIDER_SITE_OTHER): Payer: BC Managed Care – PPO

## 2023-02-12 DIAGNOSIS — G8929 Other chronic pain: Secondary | ICD-10-CM

## 2023-02-12 DIAGNOSIS — M21371 Foot drop, right foot: Secondary | ICD-10-CM | POA: Diagnosis not present

## 2023-02-12 DIAGNOSIS — M545 Low back pain, unspecified: Secondary | ICD-10-CM | POA: Diagnosis not present

## 2023-02-12 NOTE — Progress Notes (Signed)
Office Visit Note   Patient: Robert Blair           Date of Birth: May 16, 1956           MRN: [id number] Visit Date: 02/12/2023              Requested by: Burton Apley, MD [address] [place],  [place] [address] PCP: Burton Apley, MD   Assessment & Plan: Visit Diagnoses:  1. Chronic midline low back pain without sciatica   2. Foot drop, right     Plan: Pleasant 67 year old gentleman who is fairly active.  Has a history of sciatica on the right in the past.  6 weeks ago he was lifting a heavy bag of mulch of about 40 pounds.  Since then he has had progressive radiculopathy going down his right leg with numbness on the lateral side of his calf.  He also is beginning to slap his foot and has decreased strength.  No loss of bowel or bladder control.  His x-rays do not show any acute fractures.  His exam is significant for decreased plantarflexion strength in dorsiflexion strength of the right foot.  He also has altered sensation around the lower lateral on the right.  Because of this I recommend a stat MRI and follow-up with Dr. Christell Constant for evaluation.  Patient is in agreement with this plan again this has been going on for 6 weeks that he is now developing right foot weakness.  Follow-Up Instructions: No follow-ups on file.   Orders:  Orders Placed This Encounter  Procedures   XR Lumbar Spine 2-3 Views   No orders of the defined types were placed in this encounter.     Procedures: No procedures performed   Clinical Data: No additional findings.   Subjective: No chief complaint on file.   HPI Robert Blair is a pleasant 67 year old gentleman who comes in today with a complaint of abnormal gait on the right.  He does have a history of sciatica on the right.  He is concerned because he seems to be slapping his foot and unknown why.  This has been present for about 6 weeks after he was lifting 40 pound bags of mulch.  He feels like he is getting progressively weak and is  slapping his right foot denies loss of bowel or bladder control.  He has been followed by his primary care physician.  He is concerned as to why this is occurring.  No specific injury  Review of Systems  All other systems reviewed and are negative.    Objective: Vital Signs: There were no vitals taken for this visit.  Physical Exam Constitutional:      Appearance: Normal appearance.  Pulmonary:     Effort: Pulmonary effort is normal.  Skin:    General: Skin is warm and dry.  Neurological:     General: No focal deficit present.     Mental Status: He is alert.     Ortho Exam Examination of his lower back no stepdown no tenderness to palpation no tenderness in the paravertebral muscles no erythema.  He has no pain with flexion extension side-to-side bending no reproduction of symptoms.  Negative straight leg raise.  He does have decreased sensation along the lower lateral leg.  Decreased dorsiflexion strength slightly decreased plantarflexion strength.  Gait is slightly antalgic Specialty Comments:  No specialty comments available.  Imaging: XR Lumbar Spine 2-3 Views  Result Date: 02/12/2023 2 view radiographs of his lumbar spine were  obtained today.  He does have complete loss of his joint space and degenerative changes and sclerotic changes at L1-2.  No listhesis.  Other levels fairly well-preserved joint spacing no acute fractures.  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 06/16/2021   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 06/16/2021   Finger mass, right 06/16/2021   Hallux rigidus, right foot    Pain from implanted hardware    Hallux rigidus, left foot  07/30/2020   Pain in right shoulder 07/30/2020   Great toe pain, right 05/09/2020   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   Unilateral primary osteoarthritis, left hip 09/22/2019   Tendinitis, de Quervain's 06/02/2019   Pain of left hip joint 02/07/2019   Recurrent right inguinal hernia 10/29/2014   Encounter for general adult medical examination without abnormal findings 07/07/2013   Rhytides 01/06/2013   Skin laxity 01/06/2013   Hypertension 01/01/2012   Hypothyroidism 01/01/2012   History of adenomatous polyp of colon 01/01/2012   Past Medical History:  Diagnosis Date   Arthritis    Cancer (HCC) 2016   Basal cell   Complication of anesthesia    URINARY RETENTION   Depression    History of adenomatous polyp of colon    2008/  2013/  2016   History of basal cell carcinoma excision    2015  nose   History of urinary retention    10-30-2014  POST OP   Hypothyroidism    Seasonal allergies    Unspecified essential hypertension     Family History  Problem Relation Age of Onset   Breast cancer Mother    Hypertension Mother    Depression Mother    Cancer - Other Mother        unknown GI cancer   Colon polyps Father    Stroke Father    Alzheimer's disease Father    Colon cancer Neg Hx    Rectal cancer Neg Hx    Stomach cancer Neg Hx    Liver disease Neg Hx     Past Surgical History:  Procedure Laterality Date   ARTHRODESIS METATARSALPHALANGEAL JOINT (MTPJ) Right 08/13/2020   Procedure: FUSION RIGHT GREAT TOE METATARSALPHALANGEAL JOINT (MTPJ);  Surgeon: Nadara Mustard, MD;  Location: [facility];  Service: Orthopedics;  Laterality: Right;   CHOLECYSTECTOMY N/A 10/02/2022   Procedure: LAPAROSCOPIC CHOLECYSTECTOMY;  Surgeon: Axel Filler, MD;  Location: [facility];  Service: General;  Laterality: N/A;   CHOLECYSTECTOMY     COLONOSCOPY  last one 02-25-2015   GANGLION CYST EXCISION Right 08/04/2021   Procedure: EXCISION MASS RIGHT MIDDLE FINGER;  Surgeon:  Marlyne Beards, MD;  Location: [facility];  Service: Orthopedics;  Laterality: Right;   HYDROCELE EXCISION Right 08/26/2015   Procedure: RIGHT HYDROCELECTOMY ADULT;  Surgeon: Marcine Matar, MD;  Location[facility];  Service: Urology;  Laterality: Right;   INGUINAL HERNIA REPAIR Right 10/29/2014   Procedure: LAPAROSCOPIC REPAIR RECURRENT RIGHT INGUINAL ;  Surgeon: Claud Kelp, MD;  Location: [facility];  Service: General;  Laterality: Right;   INGUINAL HERNIA REPAIR Right 1984   INSERTION OF MESH Right 10/29/2014   Procedure: INSERTION OF MESH;  Surgeon: Claud Kelp,  MD;  Location: [facility];  Service: General;  Laterality: Right;   INSERTION OF MESH N/A 06/17/2020   Procedure: INSERTION OF MESH;  Surgeon: Abigail Miyamoto, MD;  Location: [facility];  Service: General;  Laterality: N/A;   POLYPECTOMY     TOE OSTEOTOMY Bilateral right 2002/  left 2013   great toe   TOTAL HIP ARTHROPLASTY Left 09/22/2019   Procedure: LEFT TOTAL HIP ARTHROPLASTY ANTERIOR APPROACH;  Surgeon: Kathryne Hitch, MD;  Location: [facility];  Service: Orthopedics;  Laterality: Left;   UMBILICAL HERNIA REPAIR N/A 06/17/2020   Procedure: UMBILICAL HERNIA REPAIR;  Surgeon: Abigail Miyamoto, MD;  Location: [facility];  Service: General;  Laterality: N/A;   Social History   Occupational History   Occupation: self employed  Tobacco Use   Smoking status: Never   Smokeless tobacco: Never  Vaping Use   Vaping Use: Never used  Substance and Sexual Activity   Alcohol use: Yes    Comment: social   Drug use: No   Sexual activity: Yes

## 2023-02-15 ENCOUNTER — Ambulatory Visit: Payer: BC Managed Care – PPO | Admitting: Physician Assistant

## 2023-02-17 ENCOUNTER — Ambulatory Visit (AMBULATORY_SURGERY_CENTER): Payer: BC Managed Care – PPO | Admitting: Internal Medicine

## 2023-02-17 ENCOUNTER — Encounter: Payer: Self-pay | Admitting: Internal Medicine

## 2023-02-17 VITALS — BP 148/85 | HR 67 | Temp 99.6°F | Resp 11 | Ht 67.0 in | Wt 220.0 lb

## 2023-02-17 DIAGNOSIS — D123 Benign neoplasm of transverse colon: Secondary | ICD-10-CM

## 2023-02-17 DIAGNOSIS — Z09 Encounter for follow-up examination after completed treatment for conditions other than malignant neoplasm: Secondary | ICD-10-CM

## 2023-02-17 DIAGNOSIS — Z86010 Personal history of colonic polyps: Secondary | ICD-10-CM

## 2023-02-17 MED ORDER — SODIUM CHLORIDE 0.9 % IV SOLN: 500.0000 mL | Freq: Once | INTRAVENOUS | Status: DC

## 2023-02-17 NOTE — Progress Notes (Signed)
Pt's states no medical or surgical changes since previsit or office visit. 

## 2023-02-17 NOTE — Op Note (Signed)
[facility] Patient Name: Robert Blair Procedure Date: 02/17/2023 9:03 AM MRN: [id number] Endoscopist: Beverley Fiedler , MD, [id number] Age: 67 Referring MD:  Date of Birth: 03/18/1956 Gender: Male Account #: [id number] Procedure:                Colonoscopy Indications:              High risk colon cancer surveillance: Personal                            history of multiple adenomas, Last colonoscopy:                            June 2019 (hyperplastic only); June 2016 (adenoma x                            3) Medicines:                Monitored Anesthesia Care Procedure:                Pre-Anesthesia Assessment:                           - Prior to the procedure, a History and Physical                            was performed, and patient medications and                            allergies were reviewed. The patient's tolerance of                            previous anesthesia was also reviewed. The risks                            and benefits of the procedure and the sedation                            options and risks were discussed with the patient.                            All questions were answered, and informed consent                            was obtained. Prior Anticoagulants: The patient has                            taken no anticoagulant or antiplatelet agents. ASA                            Grade Assessment: II - A patient with mild systemic                            disease. After reviewing the risks and benefits,  the patient was deemed in satisfactory condition to                            undergo the procedure.                           After obtaining informed consent, the colonoscope                            was passed under direct vision. Throughout the                            procedure, the patient's blood pressure, pulse, and                            oxygen saturations were monitored continuously. The                             Olympus CF-HQ190L [id number]) Colonoscope was                            introduced through the anus and advanced to the                            cecum, identified by appendiceal orifice and                            ileocecal valve. The colonoscopy was performed                            without difficulty. The patient tolerated the                            procedure well. The quality of the bowel                            preparation was good. The ileocecal valve,                            appendiceal orifice, and rectum were photographed. Scope In: 9:15:12 AM Scope Out: 9:30:24 AM Scope Withdrawal Time: 0 hours 13 minutes 28 seconds  Total Procedure Duration: 0 hours 15 minutes 12 seconds  Findings:                 The digital rectal exam was normal.                           Two sessile polyps were found in the transverse                            colon. The polyps were 3 to 4 mm in size. These                            polyps were removed with a cold snare. Resection  and retrieval were complete.                           Multiple medium-mouthed and small-mouthed                            diverticula were found in the sigmoid colon and                            descending colon.                           Internal hemorrhoids were found during                            retroflexion. The hemorrhoids were small. Complications:            No immediate complications. Estimated Blood Loss:     Estimated blood loss: none. Impression:               - Two 3 to 4 mm polyps in the transverse colon,                            removed with a cold snare. Resected and retrieved.                           - Moderate diverticulosis in the sigmoid colon and                            in the descending colon.                           - Small internal hemorrhoids. Recommendation:           - Patient has a contact number available for                             emergencies. The signs and symptoms of potential                            delayed complications were discussed with the                            patient. Return to normal activities tomorrow.                            Written discharge instructions were provided to the                            patient.                           - Resume previous diet.                           - Continue present medications.                           -  Await pathology results.                           - Repeat colonoscopy is recommended for                            surveillance. The colonoscopy date will be                            determined after pathology results from today's                            exam become available for review. Beverley Fiedler, MD 02/17/2023 9:34:20 AM This report has been signed electronically.

## 2023-02-17 NOTE — Patient Instructions (Signed)
Resume all of your previous medications.  Read all of the handouts given to you by your recovery room nurse.  YOU HAD AN ENDOSCOPIC PROCEDURE TODAY AT THE [facility]:   Refer to the procedure report that was given to you for any specific questions about what was found during the examination.  If the procedure report does not answer your questions, please call your gastroenterologist to clarify.  If you requested that your care partner not be given the details of your procedure findings, then the procedure report has been included in a sealed envelope for you to review at your convenience later.  YOU SHOULD EXPECT: Some feelings of bloating in the abdomen. Passage of more gas than usual.  Walking can help get rid of the air that was put into your GI tract during the procedure and reduce the bloating. If you had a lower endoscopy (such as a colonoscopy or flexible sigmoidoscopy) you may notice spotting of blood in your stool or on the toilet paper. If you underwent a bowel prep for your procedure, you may not have a normal bowel movement for a few days.  Please Note:  You might notice some irritation and congestion in your nose or some drainage.  This is from the oxygen used during your procedure.  There is no need for concern and it should clear up in a day or so.  SYMPTOMS TO REPORT IMMEDIATELY:  Following lower endoscopy (colonoscopy or flexible sigmoidoscopy):  Excessive amounts of blood in the stool  Significant tenderness or worsening of abdominal pains  Swelling of the abdomen that is new, acute  Fever of 100F or higher   For urgent or emergent issues, a gastroenterologist can be reached at any hour by calling [phone]. Do not use MyChart messaging for urgent concerns.    DIET:  We do recommend a small meal at first, but then you may proceed to your regular diet.  Drink plenty of fluids but you should avoid alcoholic beverages for 24 hours. Try to increase the fiber in  your diet, and drink plenty of water.  ACTIVITY:  You should plan to take it easy for the rest of today and you should NOT DRIVE or use heavy machinery until tomorrow (because of the sedation medicines used during the test).    FOLLOW UP: Our staff will call the number listed on your records the next business day following your procedure.  We will call around 7:15- 8:00 am to check on you and address any questions or concerns that you may have regarding the information given to you following your procedure. If we do not reach you, we will leave a message.     If any biopsies were taken you will be contacted by phone or by letter within the next 1-3 weeks.  Please call us at [phone] if you have not heard about the biopsies in 3 weeks.    SIGNATURES/CONFIDENTIALITY: You and/or your care partner have signed paperwork which will be entered into your electronic medical record.  These signatures attest to the fact that that the information above on your After Visit Summary has been reviewed and is understood.  Full responsibility of the confidentiality of this discharge information lies with you and/or your care-partner.

## 2023-02-17 NOTE — Progress Notes (Signed)
GASTROENTEROLOGY PROCEDURE H&P NOTE   Primary Care Physician: Burton Apley, MD    Reason for Procedure:   Hx of polyps  Plan:    colonoscopy  Patient is appropriate for endoscopic procedure(s) in the ambulatory ([facility]) setting.  The nature of the procedure, as well as the risks, benefits, and alternatives were carefully and thoroughly reviewed with the patient. Ample time for discussion and questions allowed. The patient understood, was satisfied, and agreed to proceed.     HPI: Robert Blair is a 67 y.o. male who presents for colonoscopy.  Medical history as below.  Tolerated the prep.  No recent chest pain or shortness of breath.  No abdominal pain today.  Past Medical History:  Diagnosis Date   Arthritis    Cancer (HCC) 2016   Basal cell   Complication of anesthesia    URINARY RETENTION   Depression    History of adenomatous polyp of colon    2008/  2013/  2016   History of basal cell carcinoma excision    2015  nose   History of urinary retention    10-30-2014  POST OP   Hypothyroidism    Seasonal allergies    Unspecified essential hypertension     Past Surgical History:  Procedure Laterality Date   ARTHRODESIS METATARSALPHALANGEAL JOINT (MTPJ) Right 08/13/2020   Procedure: FUSION RIGHT GREAT TOE METATARSALPHALANGEAL JOINT (MTPJ);  Surgeon: Nadara Mustard, MD;  Location: [facility];  Service: Orthopedics;  Laterality: Right;   CHOLECYSTECTOMY N/A 10/02/2022   Procedure: LAPAROSCOPIC CHOLECYSTECTOMY;  Surgeon: Axel Filler, MD;  Location: [facility];  Service: General;  Laterality: N/A;   CHOLECYSTECTOMY     COLONOSCOPY  last one 02-25-2015   GANGLION CYST EXCISION Right 08/04/2021   Procedure: EXCISION MASS RIGHT MIDDLE FINGER;  Surgeon: Marlyne Beards, MD;  Location: [facility];  Service: Orthopedics;  Laterality: Right;   HYDROCELE EXCISION Right 08/26/2015   Procedure: RIGHT HYDROCELECTOMY ADULT;  Surgeon: Marcine Matar, MD;  Location: [facility];  Service: Urology;  Laterality: Right;   INGUINAL HERNIA REPAIR Right 10/29/2014   Procedure: LAPAROSCOPIC REPAIR RECURRENT RIGHT INGUINAL ;  Surgeon: Claud Kelp, MD;  Location: [facility];  Service: General;  Laterality: Right;   INGUINAL HERNIA REPAIR Right 1984   INSERTION OF MESH Right 10/29/2014   Procedure: INSERTION OF MESH;  Surgeon: Claud Kelp, MD;  Location: [facility];  Service: General;  Laterality: Right;   INSERTION OF MESH N/A 06/17/2020   Procedure: INSERTION OF MESH;  Surgeon: Abigail Miyamoto, MD;  Location: [facility];  Service: General;  Laterality: N/A;   POLYPECTOMY     TOE OSTEOTOMY Bilateral right 2002/  left 2013   great toe   TOTAL HIP ARTHROPLASTY Left 09/22/2019   Procedure: LEFT TOTAL HIP ARTHROPLASTY ANTERIOR APPROACH;  Surgeon: Kathryne Hitch, MD;  Location: [facility];  Service: Orthopedics;  Laterality: Left;   UMBILICAL HERNIA REPAIR N/A 06/17/2020   Procedure: UMBILICAL HERNIA REPAIR;  Surgeon: Abigail Miyamoto, MD;  Location: [facility];  Service: General;  Laterality: N/A;    Prior to Admission medications   Medication Sig Start Date End Date Taking? Authorizing Provider  amLODipine (NORVASC) 10 MG tablet Take 10 mg by mouth daily.    Yes [provider]  Cholecalciferol (VITAMIN D3) 50 MCG (2000 UT) TABS Take 10,000 Units by mouth daily.   Yes [provider]  clomiPHENE (CLOMID) 50 MG tablet Take 50  mg by mouth every other day. In the morning   Yes [provider]  diclofenac (VOLTAREN) 75 MG EC tablet TAKE ONE TABLET BY MOUTH TWICE A DAY 11/01/21  Yes Benfield, Billey Gosling, MD  doxazosin (CARDURA) 1 MG tablet Take 1 mg by mouth daily. 01/28/23  Yes [provider]  EX-LAX ULTRA 5 MG EC tablet Take 10 mg by mouth 2 (two) times daily.   Yes [provider]  levothyroxine (SYNTHROID, LEVOTHROID) 112 MCG tablet Take 112 mcg by  mouth daily before breakfast.  01/10/18  Yes [provider]  MAGNESIUM PO Take 400 mg by mouth daily.    Yes [provider]  metoprolol succinate (TOPROL-XL) 100 MG 24 hr tablet Take 100 mg by mouth daily.   Yes [provider]  naltrexone (DEPADE) 50 MG tablet Take 50 mg by mouth daily. 11/09/22  Yes [provider]  valsartan-hydrochlorothiazide (DIOVAN-HCT) 320-25 MG per tablet Take 1 tablet by mouth daily.    Yes [provider]  VYVANSE 70 MG capsule Take 70 mg by mouth daily as needed (ADD).  08/25/19  Yes [provider]  fexofenadine (ALLEGRA) 180 MG tablet Take 180 mg by mouth daily as needed for allergies or rhinitis.    [provider]  fluticasone (FLONASE) 50 MCG/ACT nasal spray Place 1-2 sprays into both nostrils daily as needed for allergies.  02/12/15   [provider]  Multiple Vitamin (MULTIVITAMIN WITH MINERALS) TABS tablet Take 1 tablet by mouth 2 (two) times a week. Patient not taking: Reported on 01/21/2023    [provider]  naproxen sodium (ALEVE) 220 MG tablet Take 440-660 mg by mouth 2 (two) times daily as needed (pain.).    [provider]  POTASSIUM PO Take 1 tablet by mouth daily.     [provider]  Psyllium (METAMUCIL MULTIHEALTH FIBER) 63 % POWD Take 1 Dose by mouth daily as needed (constipation.). Patient not taking: Reported on 01/21/2023    [provider]  TURMERIC PO Take 400 mg by mouth daily.     [provider]    Current Outpatient Medications  Medication Sig Dispense Refill   amLODipine (NORVASC) 10 MG tablet Take 10 mg by mouth daily.      Cholecalciferol (VITAMIN D3) 50 MCG (2000 UT) TABS Take 10,000 Units by mouth daily.     clomiPHENE (CLOMID) 50 MG tablet Take 50 mg by mouth every other day. In the morning     diclofenac (VOLTAREN) 75 MG EC tablet TAKE ONE TABLET BY MOUTH TWICE A DAY 60 tablet 0   doxazosin (CARDURA) 1 MG tablet Take 1  mg by mouth daily.     EX-LAX ULTRA 5 MG EC tablet Take 10 mg by mouth 2 (two) times daily.     levothyroxine (SYNTHROID, LEVOTHROID) 112 MCG tablet Take 112 mcg by mouth daily before breakfast.   2   MAGNESIUM PO Take 400 mg by mouth daily.      metoprolol succinate (TOPROL-XL) 100 MG 24 hr tablet Take 100 mg by mouth daily.     naltrexone (DEPADE) 50 MG tablet Take 50 mg by mouth daily.     valsartan-hydrochlorothiazide (DIOVAN-HCT) 320-25 MG per tablet Take 1 tablet by mouth daily.      VYVANSE 70 MG capsule Take 70 mg by mouth daily as needed (ADD).      fexofenadine (ALLEGRA) 180 MG tablet Take 180 mg by mouth daily as needed for allergies or rhinitis.  fluticasone (FLONASE) 50 MCG/ACT nasal spray Place 1-2 sprays into both nostrils daily as needed for allergies.   0   Multiple Vitamin (MULTIVITAMIN WITH MINERALS) TABS tablet Take 1 tablet by mouth 2 (two) times a week. (Patient not taking: Reported on 01/21/2023)     naproxen sodium (ALEVE) 220 MG tablet Take 440-660 mg by mouth 2 (two) times daily as needed (pain.).     POTASSIUM PO Take 1 tablet by mouth daily.      Psyllium (METAMUCIL MULTIHEALTH FIBER) 63 % POWD Take 1 Dose by mouth daily as needed (constipation.). (Patient not taking: Reported on 01/21/2023)     TURMERIC PO Take 400 mg by mouth daily.      Current Facility-Administered Medications  Medication Dose Route Frequency Provider Last Rate Last Admin   0.9 %  sodium chloride infusion  500 mL Intravenous Once Anmol Fleck, Carie Caddy, MD       0.9 %  sodium chloride infusion  500 mL Intravenous Once Tesneem Dufrane, Carie Caddy, MD        Allergies as of 02/17/2023   (No Known Allergies)    Family History  Problem Relation Age of Onset   Breast cancer Mother    Hypertension Mother    Depression Mother    Cancer - Other Mother        unknown GI cancer   Colon polyps Father    Stroke Father    Alzheimer's disease Father    Colon cancer Neg Hx    Rectal cancer Neg Hx    Stomach cancer Neg  Hx    Liver disease Neg Hx     Social History   Socioeconomic History   Marital status: Married    Spouse name: Not on file   Number of children: Not on file   Years of education: Not on file   Highest education level: Not on file  Occupational History   Occupation: self employed  Tobacco Use   Smoking status: Never   Smokeless tobacco: Never  Vaping Use   Vaping Use: Never used  Substance and Sexual Activity   Alcohol use: Yes    Comment: social   Drug use: No   Sexual activity: Yes  Other Topics Concern   Not on file  Social History Narrative   Not on file   Social Determinants of Health   Financial Resource Strain: Not on file  Food Insecurity: Not on file  Transportation Needs: Not on file  Physical Activity: Not on file  Stress: Not on file  Social Connections: Not on file  Intimate Partner Violence: Not on file    Physical Exam: Vital signs in last 24 hours: @BP  (!) 164/94   Pulse 68   Temp 99.6 F (37.6 C)   Resp 15   Ht 5\' 7"  (1.702 m)   Wt 220 lb (99.8 kg)   SpO2 96%   BMI 34.46 kg/m  GEN: NAD EYE: Sclerae anicteric ENT: MMM CV: Non-tachycardic Pulm: CTA b/l GI: Soft, NT/ND NEURO:  Alert & Oriented x 3   Erick Blinks, MD [facility]  02/17/2023 9:06 AM

## 2023-02-17 NOTE — Progress Notes (Signed)
Called to room to assist during endoscopic procedure.  Patient ID and intended procedure confirmed with present staff. Received instructions for my participation in the procedure from the performing physician.  

## 2023-02-18 ENCOUNTER — Telehealth: Payer: Self-pay

## 2023-02-18 NOTE — Telephone Encounter (Signed)
  Follow up Call-     02/17/2023    8:11 AM  Call back number  Post procedure Call Back phone  # [phone]  Permission to leave phone message Yes     Patient questions:  Do you have a fever, pain , or abdominal swelling? No. Pain Score  0 *  Have you tolerated food without any problems? Yes.    Have you been able to return to your normal activities? Yes.    Do you have any questions about your discharge instructions: Diet   No. Medications  No. Follow up visit  No.  Do you have questions or concerns about your Care? No.  Actions: * If pain score is 4 or above: No action needed, pain <4.

## 2023-02-20 ENCOUNTER — Ambulatory Visit
Admission: RE | Admit: 2023-02-20 | Discharge: 2023-02-20 | Disposition: A | Discharge status: Home / Self Care | Payer: BC Managed Care – PPO | Source: Ambulatory Visit | Attending: Physician Assistant | Admitting: Physician Assistant

## 2023-02-20 DIAGNOSIS — M21371 Foot drop, right foot: Secondary | ICD-10-CM

## 2023-02-22 ENCOUNTER — Encounter: Payer: Self-pay | Admitting: Internal Medicine

## 2023-02-24 ENCOUNTER — Encounter: Payer: Self-pay | Admitting: Orthopedic Surgery

## 2023-02-24 ENCOUNTER — Other Ambulatory Visit (INDEPENDENT_AMBULATORY_CARE_PROVIDER_SITE_OTHER): Payer: BC Managed Care – PPO

## 2023-02-24 ENCOUNTER — Ambulatory Visit: Payer: BC Managed Care – PPO | Admitting: Orthopedic Surgery

## 2023-02-24 VITALS — BP 146/95 | HR 76 | Ht 67.0 in | Wt 223.0 lb

## 2023-02-24 DIAGNOSIS — M545 Low back pain, unspecified: Secondary | ICD-10-CM

## 2023-02-24 DIAGNOSIS — G8929 Other chronic pain: Secondary | ICD-10-CM

## 2023-02-24 NOTE — Progress Notes (Signed)
Orthopedic Spine Surgery Office Note  Assessment: Patient is a 67 y.o. male who had right foot drop and numbness along the lateral aspect of right leg. 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 Discussed that the conservative treatments include:  -activity modification  -physical therapy  -over the counter pain medications  -medrol dosepak  -lumbar steroid injections -Patient has tried medrol dosepak, cone strengthening, intramuscular steroid injection -Patient has noticed improvement in his foot drop and his numbness has resolved, so I said we can do a medrol dosepak or lumbar steroid injection in the future if he has return of symptoms  -Patient should return to office on an as needed basis   Patient expressed understanding of the plan and all questions were answered to the patient's satisfaction.   ___________________________________________________________________________   History:  Patient is a 67 y.o. male who presents today for lumbar spine. Patient noticed numbness and pain along the lateral aspect of his right leg starting in April 2024. He says around that time he had a deep tissue muscle massage around his piriformis to help with some tightness he was experiencing. He also was do a lot of lifting of mulch around that time. He developed a foot drop around that time. He has noticed improvement in his symptoms since doing a medrol dosepak. He is now just experiencing numbness and paresthesias in his bilateral feet. His foot drop also improved and he is back to playing tennis.    Weakness: ye,s has noticed right foot drop. No other weakness noticed Symptoms of imbalance: denies Paresthesias and numbness: yes, in bilateral feet. No, other numbness or paresthesias noted.  Bowel or bladder incontinence: denies  Saddle  anesthesia: denies  Treatments tried: medrol dosepak, cone strengthening, intramuscular steroid injection  Review of systems: Denies fevers and chills, night sweats, unexplained weight loss, pain that wakes them at night.  Has a history of basal cell carcinoma  Past medical history: Basal cell carcinoma Depression Hypothyroidism HTN  Allergies: NKDA  Past surgical history:  Basal cell carcinoma excision MTP right MTP fusion Cholecystectomy Right hand ganglion cyst excision Hernia repair Polypectomy Left THA  Social history: Denies use of nicotine product (smoking, vaping, patches, smokeless) Alcohol use: yes, rare Denies recreational drug use   Physical Exam:  General: no acute distress, appears stated age Neurologic: alert, answering questions appropriately, following commands Respiratory: unlabored breathing on room air, symmetric chest rise Psychiatric: appropriate affect, normal cadence to speech   MSK (spine):  -Strength exam      Left  Right EHL    5/5  -/5 TA    5/5  4+/5 GSC    5/5  5/5 Knee extension  5/5  5/5 Hip flexion   5/5  5/5  Has MTP fusion on the right  -Sensory exam    Sensation intact to light touch in L3-S1 nerve distributions of bilateral lower extremities  -Straight leg raise: negative bilaterally -Clonus: no beats bilaterally  -Left hip exam: no pain through range of motion -Right hip exam: no pain through range of motion  Imaging: XR of the lumbar spine from 02/12/2023 and 02/24/2023 was independently reviewed and interpreted, showing disc height loss at L5/S1.  No other significant degenerative changes.  No fracture or dislocation seen.  No evidence of instability on flexion/extension views.  MRI of the lumbar spine from 02/20/2023 was independently reviewed  and interpreted, showing central and lateral recess stenosis at L4/5.  Disc bulging and hypertrophic ligamentum flavum at L4/5.   Patient name: Robert Blair Patient  MRN: [id number] Date of visit: 02/24/23

## 2023-05-28 ENCOUNTER — Telehealth: Payer: Self-pay | Admitting: Internal Medicine

## 2023-05-28 NOTE — Telephone Encounter (Signed)
Pt scheduled to see Hyacinth Meeker PA 06/07/23 at 1:30pm. Pt aware of appt.

## 2023-05-28 NOTE — Telephone Encounter (Signed)
Inbound call from patient stating that he had his gallbladder out on January 16th and since then he has experiencing nausea without vomiting and diarrhea. Patient is wanting to discuss. Please advise.

## 2023-06-07 ENCOUNTER — Ambulatory Visit: Payer: BC Managed Care – PPO | Admitting: Physician Assistant

## 2023-06-07 ENCOUNTER — Encounter: Payer: Self-pay | Admitting: Physician Assistant

## 2023-06-07 VITALS — BP 140/80 | HR 100 | Ht 68.0 in | Wt 220.2 lb

## 2023-06-07 DIAGNOSIS — N210 Calculus in bladder: Secondary | ICD-10-CM

## 2023-06-07 DIAGNOSIS — Z9049 Acquired absence of other specified parts of digestive tract: Secondary | ICD-10-CM | POA: Diagnosis not present

## 2023-06-07 DIAGNOSIS — R110 Nausea: Secondary | ICD-10-CM

## 2023-06-07 DIAGNOSIS — R194 Change in bowel habit: Secondary | ICD-10-CM | POA: Diagnosis not present

## 2023-06-07 DIAGNOSIS — R1030 Lower abdominal pain, unspecified: Secondary | ICD-10-CM

## 2023-06-07 MED ORDER — ONDANSETRON 4 MG PO TBDP: 4.0000 mg | ORAL_TABLET | Freq: Four times a day (QID) | ORAL | 2 refills | Status: AC | PRN

## 2023-06-07 NOTE — Progress Notes (Signed)
Addendum: Reviewed and agree with assessment and management plan. Kadijah Shamoon M, MD  

## 2023-06-07 NOTE — Progress Notes (Signed)
Chief Complaint: Abdominal Pain, Nausea, Change in bowel habits  HPI:    Robert Blair is a 67 year old male with a past medical history as listed below, known to Dr. Rhea Belton, who was referred to me by Burton Apley, MD for a complaint of abdominal pain, nausea and change in bowel habits after cholecystectomy.      10/02/2022 lap cholecystectomy.    02/17/2023 colonoscopy done for history of polyps with 2 sessile polyps in the transverse colon 3-4 mm and moderate diverticulosis in the sigmoid and descending colon as well as small internal hemorrhoids.  Pathology showed adenomatous polyps and repeat recommended in 7 years.    Today, patient presents to clinic accompanied by his wife and they both explained that since he had his gallbladder out in January he has been having issues with daily nausea and some lower abdominal cramping as well as a change in bowel habits.  Prior to all of this was extremely constipated and regularly uses 2 Ex-Lax and 2 Colace every evening as well as being on a magnesium supplement in order to have regular bowel movements.  Since January though he has been waking up nauseous in the morning and often times feels so bad that he just lays back down to go back to sleep and can sleep for 4-5 more hours.  Along with this some lower abdominal cramping and a change in his bowel habits noting that sometimes he will have diarrhea now maybe once or twice a week and just irregularity, where previously his medicines worked well for him.  He did start Doxazosin about 2 months ago and all of his symptoms seemed to worsen after that.  He occasionally uses Aleve for pain relief and is on daily Turmeric for "thick blood".  Tells me for the most part he tries to take his medications with food in order to decrease the chance that this is causing symptoms but again wakes up nauseous in the morning prior to taking anything.  He does uses Ex-Lax though and Colace when he lays down to go to sleep.  Also  occasional heartburn/reflux.    He is battling with some hematuria and following with urology currently.  He is supposed to follow-up with them after recent CT.  He is unsure of results.    Denies fever, chills, weight loss or blood in his stool.  Past Medical History:  Diagnosis Date   Arthritis    Cancer (HCC) 2016   Basal cell   Complication of anesthesia    URINARY RETENTION   Depression    History of adenomatous polyp of colon    2008/  2013/  2016   History of basal cell carcinoma excision    2015  nose   History of urinary retention    10-30-2014  POST OP   Hypothyroidism    Seasonal allergies    Unspecified essential hypertension     Past Surgical History:  Procedure Laterality Date   ARTHRODESIS METATARSALPHALANGEAL JOINT (MTPJ) Right 08/13/2020   Procedure: FUSION RIGHT GREAT TOE METATARSALPHALANGEAL JOINT (MTPJ);  Surgeon: Nadara Mustard, MD;  Location: [facility];  Service: Orthopedics;  Laterality: Right;   CHOLECYSTECTOMY N/A 10/02/2022   Procedure: LAPAROSCOPIC CHOLECYSTECTOMY;  Surgeon: Axel Filler, MD;  Location: [facility];  Service: General;  Laterality: N/A;   COLONOSCOPY  last one 02-25-2015   GANGLION CYST EXCISION Right 08/04/2021   Procedure: EXCISION MASS RIGHT MIDDLE FINGER;  Surgeon: Marlyne Beards, MD;  Location: [facility];  Service: Orthopedics;  Laterality: Right;   HYDROCELE EXCISION Right 08/26/2015   Procedure: RIGHT HYDROCELECTOMY ADULT;  Surgeon: Marcine Matar, MD;  Location: [facility];  Service: Urology;  Laterality: Right;   INGUINAL HERNIA REPAIR Right 10/29/2014   Procedure: LAPAROSCOPIC REPAIR RECURRENT RIGHT INGUINAL ;  Surgeon: Claud Kelp, MD;  Location: [facility];  Service: General;  Laterality: Right;   INGUINAL HERNIA REPAIR Right 1984   INSERTION OF MESH Right 10/29/2014   Procedure: INSERTION OF MESH;  Surgeon: Claud Kelp, MD;  Location: [facility];  Service: General;   Laterality: Right;   INSERTION OF MESH N/A 06/17/2020   Procedure: INSERTION OF MESH;  Surgeon: Abigail Miyamoto, MD;  Location: [facility];  Service: General;  Laterality: N/A;   POLYPECTOMY     TOE OSTEOTOMY Bilateral right 2002/  left 2013   great toe   TOTAL HIP ARTHROPLASTY Left 09/22/2019   Procedure: LEFT TOTAL HIP ARTHROPLASTY ANTERIOR APPROACH;  Surgeon: Kathryne Hitch, MD;  Location: [facility];  Service: Orthopedics;  Laterality: Left;   UMBILICAL HERNIA REPAIR N/A 06/17/2020   Procedure: UMBILICAL HERNIA REPAIR;  Surgeon: Abigail Miyamoto, MD;  Location: [facility];  Service: General;  Laterality: N/A;    Current Outpatient Medications  Medication Sig Dispense Refill   amLODipine (NORVASC) 10 MG tablet Take 10 mg by mouth daily.      Cholecalciferol (VITAMIN D3) 50 MCG (2000 UT) TABS Take 10,000 Units by mouth daily.     clomiPHENE (CLOMID) 50 MG tablet Take 50 mg by mouth every other day. In the morning     diclofenac (VOLTAREN) 75 MG EC tablet TAKE ONE TABLET BY MOUTH TWICE A DAY 60 tablet 0   doxazosin (CARDURA) 1 MG tablet Take 1 mg by mouth daily.     EX-LAX ULTRA 5 MG EC tablet Take 10 mg by mouth 2 (two) times daily.     fexofenadine (ALLEGRA) 180 MG tablet Take 180 mg by mouth daily as needed for allergies or rhinitis.     fluticasone (FLONASE) 50 MCG/ACT nasal spray Place 1-2 sprays into both nostrils daily as needed for allergies.   0   levothyroxine (SYNTHROID, LEVOTHROID) 112 MCG tablet Take 112 mcg by mouth daily before breakfast.   2   MAGNESIUM PO Take 400 mg by mouth daily.      metoprolol succinate (TOPROL-XL) 50 MG 24 hr tablet Take 50 mg by mouth daily.     Multiple Vitamin (MULTIVITAMIN WITH MINERALS) TABS tablet Take 1 tablet by mouth 2 (two) times a week.     naltrexone (DEPADE) 50 MG tablet Take 50 mg by mouth daily.     naproxen sodium (ALEVE) 220 MG tablet Take 440-660 mg by mouth 2 (two) times daily as needed  (pain.).     POTASSIUM PO Take 1 tablet by mouth daily.      TURMERIC PO Take 400 mg by mouth daily.      valsartan-hydrochlorothiazide (DIOVAN-HCT) 320-25 MG per tablet Take 1 tablet by mouth daily.      VYVANSE 70 MG capsule Take 70 mg by mouth daily as needed (ADD).      No current facility-administered medications for this visit.    Allergies as of 06/07/2023   (No Known Allergies)    Family History  Problem Relation Age of Onset   Breast cancer Mother    Hypertension Mother    Depression Mother    Cancer - Other Mother  unknown GI cancer   Colon polyps Father    Stroke Father    Alzheimer's disease Father    Colon cancer Neg Hx    Rectal cancer Neg Hx    Stomach cancer Neg Hx    Liver disease Neg Hx     Social History   Socioeconomic History   Marital status: Married    Spouse name: Not on file   Number of children: Not on file   Years of education: Not on file   Highest education level: Not on file  Occupational History   Occupation: self employed  Tobacco Use   Smoking status: Never   Smokeless tobacco: Never  Vaping Use   Vaping status: Never Used  Substance and Sexual Activity   Alcohol use: Yes    Comment: social   Drug use: No   Sexual activity: Yes  Other Topics Concern   Not on file  Social History Narrative   Not on file   Social Determinants of Health   Financial Resource Strain: Not on file  Food Insecurity: Not on file  Transportation Needs: Not on file  Physical Activity: Not on file  Stress: Not on file  Social Connections: Not on file  Intimate Partner Violence: Not on file    Review of Systems:    Constitutional: No weight loss, fever or chills Cardiovascular: No chest pain Respiratory: No SOB  Gastrointestinal: See HPI and otherwise negative   Physical Exam:  Vital signs: BP (!) 140/80 (BP Location: Left Arm, Patient Position: Sitting, Cuff Size: Large)   Pulse 100   Ht 5\' 8"  (1.727 m)   Wt 220 lb 4 oz (99.9 kg)    BMI 33.49 kg/m   Constitutional:   Pleasant Overweight Caucasian male appears to be in NAD, Well developed, Well nourished, alert and cooperative Head:  Normocephalic and atraumatic. Eyes:   PEERL, EOMI. No icterus. Conjunctiva pink. Ears:  Normal auditory acuity. Neck:  Supple Throat: Oral cavity and pharynx without inflammation, swelling or lesion.  Respiratory: Respirations even and unlabored. Lungs clear to auscultation bilaterally.   No wheezes, crackles, or rhonchi.  Cardiovascular: Normal S1, S2. No MRG. Regular rate and rhythm. No peripheral edema, cyanosis or pallor.  Gastrointestinal:  Soft, nondistended, nontender. No rebound or guarding. Normal bowel sounds. No appreciable masses or hepatomegaly. Rectal:  Not performed.  Msk:  Symmetrical without gross deformities. Without edema, no deformity or joint abnormality.  Neurologic:  Alert and  oriented x4;  grossly normal neurologically.  Skin:   Dry and intact without significant lesions or rashes. Psychiatric:Demonstrates good judgement and reason without abnormal affect or behaviors.  RELEVANT LABS AND IMAGING: CBC    Component Value Date/Time   WBC 9.0 10/02/2022 0806   RBC 5.56 10/02/2022 0806   HGB 16.9 10/02/2022 0806   HCT 49.6 10/02/2022 0806   PLT 250 10/02/2022 0806   MCV 89.2 10/02/2022 0806   MCH 30.4 10/02/2022 0806   MCHC 34.1 10/02/2022 0806   RDW 12.9 10/02/2022 0806   LYMPHSABS 2.3 10/02/2022 0806   MONOABS 0.6 10/02/2022 0806   EOSABS 0.2 10/02/2022 0806   BASOSABS 0.0 10/02/2022 0806    CMP     Component Value Date/Time   NA 136 10/02/2022 0806   K 3.2 (L) 10/02/2022 0806   CL 102 10/02/2022 0806   CO2 26 10/02/2022 0806   GLUCOSE 106 (H) 10/02/2022 0806   BUN 20 10/02/2022 0806   CREATININE 0.94 10/02/2022 0806   CALCIUM 9.0  10/02/2022 0806   PROT 7.2 10/02/2022 0806   ALBUMIN 4.2 10/02/2022 0806   AST 31 10/02/2022 0806   ALT 39 10/02/2022 0806   ALKPHOS 51 10/02/2022 0806   BILITOT  0.7 10/02/2022 0806   GFRNONAA >60 10/02/2022 0806   GFRAA >60 06/17/2020 2536    Assessment: 1.  Nausea: Consider relation to gastritis versus constipation versus bile induced gastritis versus meds versus possibly/most likely bladder stones as below 2.  Generalized abdominal discomfort: This is typically lower, likely related to bladder stones which I noted on CT, though this needs to be formally reviewed by radiologist 3.  Change in bowel habits: With below, could also have to do with new medication including Doxazosin and laxatives 4.  Status post cholecystectomy: In January when a lot of his problem started, could be bile induced gastritis +/- bile salt induced diarrhea  Plan: 1.  Recommend the patient do a trial with no laxatives given that he is having some diarrhea on Ex-Lax 2 at night and Colace 2 at night.  I would like to see what his "normal" bowel movements are.  Told him if he does not have a bowel movement at all for 3 to 4 days and he can go ahead and take them again.  Otherwise I want to see what his regular is. 2.  Would hold on any of the supplements that he shows me today including probiotic and something for having no gallbladder which looks somewhat comparable to an over-the-counter version of may be colestipol. 3.  If the above is not helpful with his morning nausea then would recommend taking a daily Prilosec 20 to 30 minutes before bedtime for the next 2 weeks to see if this helps with any gastritis which may be leading to his issues. 4.  Also prescribed Zofran 4 mg 1-2 tabs every 6 hours for nausea #30 with 1 refill. 5.  If the above measures are not helpful then would recommend an endoscopy for further evaluation of this ongoing nausea. 6.  We also called over to the urologist today.  I briefly looked at CT which looks like patient has many bladder stones which are likely the cause of his hematuria and could be adding to his lower abdominal discomfort and nausea.  He needs  to be seen by them sooner than the end of October.  Did call him, he was able to make an appointment for Wednesday, 06/09/2023. 7.  Patient to follow in clinic with [place] at my next available appointment in January or sooner as indicated by symptoms above.  Could likely get him in for an EGD prior to that if it is necessary.  Hyacinth Meeker, PA-C [facility] 06/07/2023, 1:31 PM  Cc: Burton Apley, MD

## 2023-06-07 NOTE — Patient Instructions (Addendum)
Hold laxative for 3-4 day to see what normal bowel movements are.   If that does help start OTC Prilosec daily at bedtime.   We have sent the following medications to your pharmacy for you to pick up at your convenience: Zofran 4 mg ODT 1-2 tablets every 6 hours as needed for nausea.   _______________________________________________________  If your blood pressure at your visit was 140/90 or greater, please contact your primary care physician to follow up on this.  _______________________________________________________  If you are age 27 or older, your body mass index should be between 23-30. Your Body mass index is 33.49 kg/m. If this is out of the aforementioned range listed, please consider follow up with your Primary Care Provider.  If you are age 51 or younger, your body mass index should be between 19-25. Your Body mass index is 33.49 kg/m. If this is out of the aformentioned range listed, please consider follow up with your Primary Care Provider.   ________________________________________________________  The [facility] providers would like to encourage you to use [facility] to communicate with providers for non-urgent requests or questions.  Due to long hold times on the telephone, sending your provider a message by [facility] may be a faster and more efficient way to get a response.  Please allow 48 business hours for a response.  Please remember that this is for non-urgent requests.  _______________________________________________________

## 2023-06-30 ENCOUNTER — Other Ambulatory Visit: Payer: Self-pay | Admitting: Ophthalmology

## 2023-07-01 LAB — SURGICAL PATHOLOGY

## 2023-07-16 HISTORY — PX: OTHER SURGICAL HISTORY: SHX169

## 2023-08-02 ENCOUNTER — Other Ambulatory Visit: Payer: Self-pay

## 2023-08-02 ENCOUNTER — Emergency Department (HOSPITAL_COMMUNITY)
Admission: EM | Admit: 2023-08-02 | Discharge: 2023-08-02 | Disposition: A | Discharge status: Home / Self Care | Payer: BC Managed Care – PPO | Attending: Emergency Medicine | Admitting: Emergency Medicine

## 2023-08-02 ENCOUNTER — Encounter (HOSPITAL_COMMUNITY): Payer: Self-pay | Admitting: Emergency Medicine

## 2023-08-02 ENCOUNTER — Emergency Department (HOSPITAL_COMMUNITY): Payer: BC Managed Care – PPO

## 2023-08-02 DIAGNOSIS — Y92312 Tennis court as the place of occurrence of the external cause: Secondary | ICD-10-CM | POA: Insufficient documentation

## 2023-08-02 DIAGNOSIS — S61111A Laceration without foreign body of right thumb with damage to nail, initial encounter: Secondary | ICD-10-CM | POA: Diagnosis present

## 2023-08-02 DIAGNOSIS — W232XXA Caught, crushed, jammed or pinched between a moving and stationary object, initial encounter: Secondary | ICD-10-CM | POA: Diagnosis not present

## 2023-08-02 MED ORDER — LIDOCAINE HCL (PF) 1 % IJ SOLN
10.0000 mL | Freq: Once | INTRAMUSCULAR | Status: AC
  Administered 2023-08-02: 10 mL
  Filled 2023-08-02: qty 30

## 2023-08-02 MED ORDER — CEPHALEXIN 500 MG PO CAPS: 500.0000 mg | ORAL_CAPSULE | Freq: Two times a day (BID) | ORAL | 0 refills | Status: AC

## 2023-08-02 NOTE — ED Provider Notes (Signed)
[place] EMERGENCY DEPARTMENT AT [facility] Provider Note   CSN: [id number] Arrival date & time: 08/02/23  1358     History  Chief Complaint  Patient presents with   Laceration    Robert Blair is a 67 y.o. male with overall noncontributory past medical history who presents with concern for laceration to the top of the right thumb.  Patient reports that he was closing a gate at his tennis court when it got stuck and took off the tip of the right finger.  Tetanus up-to-date within the last year.   Laceration      Home Medications Prior to Admission medications   Medication Sig Start Date End Date Taking? Authorizing Provider  amLODipine (NORVASC) 10 MG tablet Take 10 mg by mouth daily.     [provider]  Cholecalciferol (VITAMIN D3) 50 MCG (2000 UT) TABS Take 10,000 Units by mouth daily.    [provider]  clomiPHENE (CLOMID) 50 MG tablet Take 50 mg by mouth every other day. In the morning    [provider]  diclofenac (VOLTAREN) 75 MG EC tablet TAKE ONE TABLET BY MOUTH TWICE A DAY 11/01/21   Marlyne Beards, MD  doxazosin (CARDURA) 1 MG tablet Take 1 mg by mouth daily. 01/28/23   [provider]  EX-LAX ULTRA 5 MG EC tablet Take 10 mg by mouth 2 (two) times daily.    [provider]  fexofenadine (ALLEGRA) 180 MG tablet Take 180 mg by mouth daily as needed for allergies or rhinitis.    [provider]  fluticasone (FLONASE) 50 MCG/ACT nasal spray Place 1-2 sprays into both nostrils daily as needed for allergies.  02/12/15   [provider]  levothyroxine (SYNTHROID, LEVOTHROID) 112 MCG tablet Take 112 mcg by mouth daily before breakfast.  01/10/18   [provider]  MAGNESIUM PO Take 400 mg by mouth daily.     [provider]  metoprolol succinate (TOPROL-XL) 50 MG 24 hr tablet Take 50 mg by mouth daily. 03/21/23   [provider]  Multiple Vitamin (MULTIVITAMIN WITH  MINERALS) TABS tablet Take 1 tablet by mouth 2 (two) times a week.    [provider]  naltrexone (DEPADE) 50 MG tablet Take 50 mg by mouth daily. 11/09/22   [provider]  naproxen sodium (ALEVE) 220 MG tablet Take 440-660 mg by mouth 2 (two) times daily as needed (pain.).    [provider]  ondansetron (ZOFRAN-ODT) 4 MG disintegrating tablet Take 1 tablet (4 mg total) by mouth every 6 (six) hours as needed for nausea or vomiting. 06/07/23   Unk Lightning, PA  POTASSIUM PO Take 1 tablet by mouth daily.     [provider]  TURMERIC PO Take 400 mg by mouth daily.     [provider]  valsartan-hydrochlorothiazide (DIOVAN-HCT) 320-25 MG per tablet Take 1 tablet by mouth daily.     [provider]  VYVANSE 70 MG capsule Take 70 mg by mouth daily as needed (ADD).  08/25/19   [provider]      Allergies    Patient has no known allergies.    Review of Systems   Review of Systems  All other systems reviewed and are negative.   Physical Exam Updated Vital Signs BP (!) 155/113   Pulse 93   Temp 98.2 F (36.8 C) (Oral)   Resp 18   SpO2 99%  Physical Exam Vitals and nursing note reviewed.  Constitutional:      General: He is not in acute distress.    Appearance: Normal appearance.  HENT:     Head: Normocephalic and atraumatic.  Eyes:     General:        Right eye: No discharge.        Left eye: No discharge.  Cardiovascular:     Rate and Rhythm: Normal rate and regular rhythm.     Pulses: Normal pulses.  Pulmonary:     Effort: Pulmonary effort is normal. No respiratory distress.  Musculoskeletal:        General: No deformity.  Skin:    General: Skin is warm and dry.     Capillary Refill: Capillary refill takes less than 2 seconds.     Comments: Deformity of thumbnail, clean removal of tip of thumbnail, lateral laceration with bleeding noted along the nail line. No foreign body noted. No ttp along rest of  nail bed, base of nail is totally intact.  Neurological:     Mental Status: He is alert and oriented to person, place, and time.  Psychiatric:        Mood and Affect: Mood normal.        Behavior: Behavior normal.     ED Results / Procedures / Treatments   Labs (all labs ordered are listed, but only abnormal results are displayed) Labs Reviewed - No data to display  EKG None  Radiology No results found.  Procedures .Nerve Block  Date/Time: 08/02/2023 3:26 PM  Performed by: Olene Floss, PA-C Authorized by: Olene Floss, PA-C   Consent:    Consent obtained:  Verbal   Consent given by:  Patient   Risks, benefits, and alternatives were discussed: yes     Risks discussed:  Allergic reaction, infection, nerve damage, unsuccessful block, pain, swelling, intravenous injection and bleeding   Alternatives discussed:  No treatment Universal protocol:    Procedure explained and questions answered to patient or proxy's satisfaction: yes     Patient identity confirmed:  Verbally with patient Indications:    Indications:  Procedural anesthesia Location:    Body area:  Upper extremity   Upper extremity nerve blocked: digital nerve block, ring block of thumb.   Laterality:  Right Pre-procedure details:    Skin preparation:  Alcohol Skin anesthesia:    Skin anesthesia method:  Local infiltration   Local anesthetic:  Lidocaine 1% w/o epi Procedure details:    Block needle gauge:  25 G   Anesthetic injected:  Lidocaine 1% WITH epi   Injection procedure:  Anatomic landmarks identified, incremental injection, negative aspiration for blood, introduced needle and anatomic landmarks palpated   Paresthesia:  None Post-procedure details:    Outcome:  Anesthesia achieved   Procedure completion:  Tolerated     Medications Ordered in ED Medications  lidocaine (PF) (XYLOCAINE) 1 % injection 10 mL (has no administration in time range)    ED Course/ Medical Decision  Making/ A&P                                 Medical Decision Making Amount and/or Complexity of Data Reviewed Radiology: ordered.  Risk Prescription drug management.   This is an overall well-appearing 67 yo male who presents with a laceration of the distal right thumb and nail.  The wound was explored through full range of motion, there is no evidence of foreign body, fascia rupture,  damage to the deep vessels, capillary refill is impaired distal to site laceration, tip of thumb becoming dusky.  Patient is up-to-date on their tetanus.  Otherwise neurovascularly intact throughout on my exam, intact strength of the affected extremity.   I requested consultation with the orthopedic physician, spoke with Earney Hamburg PA-C who after evaluation of the physical exam and photos of the wounds recommended total or partial removal of nail followed by attempted repair of the laceration with vicryl sutures.  I performed a digital nerve block, and removed around 0.5cm of nail proximal to the site of injury. The tissue underlying was loose and friable. I did not feel that suture repair on this foundation was likely to provide and closure of the affected wound site. My attending physician, Dr. Dalene Seltzer at this time will attempt repair of the site. Patient will follow up with hand surgery after ED repair of laceration and nail bed.  I independently interpreted imaging including plain film radiograph of the right thumb which shows no evidence of tuft fracture.Radiology interpretation pending at time of handoff.  I discussed this case with my attending physician who cosigned this note including patient's presenting symptoms, physical exam, and planned diagnostics and interventions. Attending physician stated agreement with plan or made changes to plan which were implemented.   Attending physician assessed patient at bedside.   Final Clinical Impression(s) / ED Diagnoses Final diagnoses:  None    Rx  / DC Orders ED Discharge Orders     None         [place] 08/02/23 1530    Alvira Monday, MD 08/03/23 2312

## 2023-08-02 NOTE — ED Provider Notes (Signed)
  Physical Exam  BP (!) 155/113   Pulse 93   Temp 98.2 F (36.8 C) (Oral)   Resp 18   SpO2 99%   Physical Exam Vitals and nursing note reviewed.  Constitutional:      General: He is not in acute distress.    Appearance: Normal appearance. He is not ill-appearing, toxic-appearing or diaphoretic.  HENT:     Head: Normocephalic.  Eyes:     Conjunctiva/sclera: Conjunctivae normal.  Cardiovascular:     Rate and Rhythm: Normal rate and regular rhythm.     Pulses: Normal pulses.  Pulmonary:     Effort: Pulmonary effort is normal. No respiratory distress.  Musculoskeletal:        General: No deformity or signs of injury.     Cervical back: No rigidity.  Skin:    General: Skin is warm and dry.     Coloration: Skin is not jaundiced or pale.  Neurological:     General: No focal deficit present.     Mental Status: He is alert and oriented to person, place, and time.     Procedures  .Marland KitchenLaceration Repair  Date/Time: 08/03/2023 10:40 PM  Performed by: Alvira Monday, MD Authorized by: Alvira Monday, MD   Consent:    Consent obtained:  Verbal   Consent given by:  Patient   Risks, benefits, and alternatives were discussed: yes     Risks discussed:  Infection, need for additional repair, pain, poor cosmetic result and poor wound healing   Alternatives discussed:  No treatment Universal protocol:    Patient identity confirmed:  Verbally with patient Anesthesia:    Anesthesia method:  Nerve block and local infiltration Laceration details:    Location:  Finger   Finger location:  R thumb   Length (cm):  2 Pre-procedure details:    Preparation:  Patient was prepped and draped in usual sterile fashion Treatment:    Area cleansed with:  Povidone-iodine and saline   Amount of cleaning:  Standard   Irrigation solution:  Sterile saline Skin repair:    Repair method:  Sutures   Suture size:  5-0   Suture material:  Fast-absorbing gut (vicryl)   Suture technique:  Simple  interrupted   Number of sutures:  5 Repair type:    Repair type:  Simple Post-procedure details:    Dressing:  Non-adherent dressing, sterile dressing and splint for protection Comments:     Dermabond used at lateral side given thin tissue.   ED Course / MDM     Presents with concern for thumb laceration. XR personally reviewed and see no signs of fracture-radiology read later also agrees no fracture.    Part of distal nail removed by Prosperi PA-C after discussion with on call hand provider.  Repair performed as above, placed xeroform, bulky dressing.  Recommend close follow up with Dr. Frazier Butt.  Given keflex rx. Patient discharged in stable condition with understanding of reasons to return.     Alvira Monday, MD 08/03/23 2253

## 2023-08-02 NOTE — ED Triage Notes (Signed)
While closing a gate, the patient sliced the top of his thumb. Bleeding has been controlled.

## 2023-08-25 ENCOUNTER — Other Ambulatory Visit: Payer: Self-pay

## 2023-08-25 ENCOUNTER — Encounter (HOSPITAL_BASED_OUTPATIENT_CLINIC_OR_DEPARTMENT_OTHER): Payer: Self-pay | Admitting: Orthopedic Surgery

## 2023-08-27 ENCOUNTER — Encounter (HOSPITAL_BASED_OUTPATIENT_CLINIC_OR_DEPARTMENT_OTHER)
Admission: RE | Admit: 2023-08-27 | Discharge: 2023-08-27 | Disposition: A | Discharge status: Home / Self Care | Payer: BC Managed Care – PPO | Source: Ambulatory Visit | Attending: Orthopedic Surgery | Admitting: Orthopedic Surgery

## 2023-08-27 DIAGNOSIS — Z01818 Encounter for other preprocedural examination: Secondary | ICD-10-CM | POA: Diagnosis present

## 2023-08-27 LAB — BASIC METABOLIC PANEL
Anion gap: 12 (ref 5–15)
BUN: 23 mg/dL (ref 8–23)
CO2: 26 mmol/L (ref 22–32)
Calcium: 10.2 mg/dL (ref 8.9–10.3)
Chloride: 102 mmol/L (ref 98–111)
Creatinine, Ser: 1.29 mg/dL — ABNORMAL HIGH (ref 0.61–1.24)
GFR, Estimated: >60 mL/min (ref >60)
Glucose, Bld: 106 mg/dL — ABNORMAL HIGH (ref 70–99)
Potassium: 3.7 mmol/L (ref 3.5–5.1)
Sodium: 140 mmol/L (ref 135–145)

## 2023-08-27 NOTE — Progress Notes (Signed)

## 2023-08-31 NOTE — Anesthesia Preprocedure Evaluation (Signed)
Anesthesia Evaluation  Patient identified by MRN, date of birth, ID band Patient awake    Reviewed: Allergy & Precautions, NPO status , Patient's Chart, lab work & pertinent test results  Airway Mallampati: III  TM Distance: >3 FB Neck ROM: Full    Dental  (+) Dental Advisory Given, Teeth Intact   Pulmonary neg pulmonary ROS   Pulmonary exam normal breath sounds clear to auscultation       Cardiovascular hypertension, Pt. on medications and Pt. on home beta blockers Normal cardiovascular exam Rhythm:Regular Rate:Normal     Neuro/Psych  PSYCHIATRIC DISORDERS Anxiety Depression    negative neurological ROS     GI/Hepatic negative GI ROS, Neg liver ROS,,,  Endo/Other  Hypothyroidism    Renal/GU negative Renal ROS     Musculoskeletal  (+) Arthritis ,    Abdominal  (+) + obese  Peds  Hematology negative hematology ROS (+)   Anesthesia Other Findings   Reproductive/Obstetrics                             Anesthesia Physical Anesthesia Plan  ASA: 3  Anesthesia Plan: Regional   Post-op Pain Management: Tylenol PO (pre-op)* and Regional block*   Induction: Intravenous  PONV Risk Score and Plan: Ondansetron, Dexamethasone, Treatment may vary due to age or medical condition, Propofol infusion and TIVA  Airway Management Planned: Natural Airway  Additional Equipment:   Intra-op Plan:   Post-operative Plan:   Informed Consent: I have reviewed the patients History and Physical, chart, labs and discussed the procedure including the risks, benefits and alternatives for the proposed anesthesia with the patient or authorized representative who has indicated his/her understanding and acceptance.     Dental advisory given  Plan Discussed with: CRNA  Anesthesia Plan Comments: (Surgeon present in preop at [phone]. First pt not properly NPO. Dr. Emilee Hero in room with Mr. Grandinetti until ~0836. H&P in  chart at 0845. Block delayed for H&P.)        Anesthesia Quick Evaluation

## 2023-09-01 ENCOUNTER — Encounter (HOSPITAL_BASED_OUTPATIENT_CLINIC_OR_DEPARTMENT_OTHER)
Admission: RE | Disposition: A | Discharge status: Home / Self Care | Payer: Self-pay | Source: Home / Self Care | Attending: Orthopedic Surgery

## 2023-09-01 ENCOUNTER — Ambulatory Visit (HOSPITAL_BASED_OUTPATIENT_CLINIC_OR_DEPARTMENT_OTHER): Payer: Self-pay | Admitting: Anesthesiology

## 2023-09-01 ENCOUNTER — Other Ambulatory Visit: Payer: Self-pay

## 2023-09-01 ENCOUNTER — Ambulatory Visit (HOSPITAL_BASED_OUTPATIENT_CLINIC_OR_DEPARTMENT_OTHER): Payer: BC Managed Care – PPO

## 2023-09-01 ENCOUNTER — Ambulatory Visit (HOSPITAL_BASED_OUTPATIENT_CLINIC_OR_DEPARTMENT_OTHER)
Admission: RE | Admit: 2023-09-01 | Discharge: 2023-09-01 | Disposition: A | Discharge status: Home / Self Care | Payer: BC Managed Care – PPO | Attending: Orthopedic Surgery | Admitting: Orthopedic Surgery

## 2023-09-01 ENCOUNTER — Encounter (HOSPITAL_BASED_OUTPATIENT_CLINIC_OR_DEPARTMENT_OTHER): Payer: Self-pay | Admitting: Orthopedic Surgery

## 2023-09-01 DIAGNOSIS — F32A Depression, unspecified: Secondary | ICD-10-CM | POA: Diagnosis not present

## 2023-09-01 DIAGNOSIS — F419 Anxiety disorder, unspecified: Secondary | ICD-10-CM | POA: Diagnosis not present

## 2023-09-01 DIAGNOSIS — M19041 Primary osteoarthritis, right hand: Secondary | ICD-10-CM | POA: Insufficient documentation

## 2023-09-01 DIAGNOSIS — I10 Essential (primary) hypertension: Secondary | ICD-10-CM | POA: Diagnosis not present

## 2023-09-01 DIAGNOSIS — I1A0 Resistant hypertension: Secondary | ICD-10-CM

## 2023-09-01 DIAGNOSIS — E039 Hypothyroidism, unspecified: Secondary | ICD-10-CM | POA: Insufficient documentation

## 2023-09-01 DIAGNOSIS — Z79899 Other long term (current) drug therapy: Secondary | ICD-10-CM | POA: Insufficient documentation

## 2023-09-01 HISTORY — DX: Attention-deficit hyperactivity disorder, unspecified type: F90.9

## 2023-09-01 HISTORY — DX: Anxiety disorder, unspecified: F41.9

## 2023-09-01 HISTORY — PX: FINGER ARTHRODESIS: SHX5000

## 2023-09-01 SURGERY — FUSION, JOINT, FINGER
Anesthesia: Regional | Site: Finger | Laterality: Right

## 2023-09-01 MED ORDER — PROPOFOL 500 MG/50ML IV EMUL
INTRAVENOUS | Status: DC | PRN
  Administered 2023-09-01: 150 ug/kg/min via INTRAVENOUS

## 2023-09-01 MED ORDER — FENTANYL CITRATE (PF) 100 MCG/2ML IJ SOLN
INTRAMUSCULAR | Status: DC | PRN
  Administered 2023-09-01: 50 ug via INTRAVENOUS

## 2023-09-01 MED ORDER — PHENYLEPHRINE HCL (PRESSORS) 10 MG/ML IV SOLN
INTRAVENOUS | Status: AC
Start: 2023-09-01 — End: ?
  Filled 2023-09-01: qty 1

## 2023-09-01 MED ORDER — PHENYLEPHRINE HCL-NACL 20-0.9 MG/250ML-% IV SOLN
INTRAVENOUS | Status: DC | PRN
  Administered 2023-09-01: 40 ug/min via INTRAVENOUS

## 2023-09-01 MED ORDER — PHENYLEPHRINE HCL (PRESSORS) 10 MG/ML IV SOLN
INTRAVENOUS | Status: DC | PRN
  Administered 2023-09-01 (×2): 80 ug via INTRAVENOUS

## 2023-09-01 MED ORDER — OXYCODONE HCL 5 MG PO TABS: 5.0000 mg | ORAL_TABLET | ORAL | 0 refills | Status: AC | PRN

## 2023-09-01 MED ORDER — LIDOCAINE HCL (CARDIAC) PF 100 MG/5ML IV SOSY
PREFILLED_SYRINGE | INTRAVENOUS | Status: DC | PRN
  Administered 2023-09-01: 20 mg via INTRAVENOUS

## 2023-09-01 MED ORDER — FENTANYL CITRATE (PF) 100 MCG/2ML IJ SOLN
INTRAMUSCULAR | Status: AC
  Filled 2023-09-01: qty 2

## 2023-09-01 MED ORDER — LACTATED RINGERS IV SOLN: INTRAVENOUS | Status: DC

## 2023-09-01 MED ORDER — MIDAZOLAM HCL 2 MG/2ML IJ SOLN
1.0000 mg | Freq: Once | INTRAMUSCULAR | Status: AC
  Administered 2023-09-01: 2 mg via INTRAVENOUS

## 2023-09-01 MED ORDER — CEFAZOLIN SODIUM-DEXTROSE 2-4 GM/100ML-% IV SOLN
INTRAVENOUS | Status: AC
  Filled 2023-09-01: qty 100

## 2023-09-01 MED ORDER — 0.9 % SODIUM CHLORIDE (POUR BTL) OPTIME
TOPICAL | Status: DC | PRN
  Administered 2023-09-01: 500 mL

## 2023-09-01 MED ORDER — PHENYLEPHRINE HCL-NACL 20-0.9 MG/250ML-% IV SOLN: INTRAVENOUS | Status: DC | PRN

## 2023-09-01 MED ORDER — CEFAZOLIN SODIUM-DEXTROSE 2-4 GM/100ML-% IV SOLN
2.0000 g | INTRAVENOUS | Status: AC
  Administered 2023-09-01: 2 g via INTRAVENOUS

## 2023-09-01 MED ORDER — SODIUM CHLORIDE 0.9 % IV SOLN: INTRAVENOUS | Status: DC | PRN

## 2023-09-01 MED ORDER — MIDAZOLAM HCL 2 MG/2ML IJ SOLN
INTRAMUSCULAR | Status: AC
  Filled 2023-09-01: qty 2

## 2023-09-01 MED ORDER — ROPIVACAINE HCL 5 MG/ML IJ SOLN
INTRAMUSCULAR | Status: DC | PRN
  Administered 2023-09-01: 35 mL via PERINEURAL

## 2023-09-01 MED ORDER — DEXAMETHASONE SODIUM PHOSPHATE 4 MG/ML IJ SOLN
INTRAMUSCULAR | Status: DC | PRN
  Administered 2023-09-01: 5 mg via PERINEURAL

## 2023-09-01 MED ORDER — MIDAZOLAM HCL 5 MG/5ML IJ SOLN
INTRAMUSCULAR | Status: DC | PRN
  Administered 2023-09-01: 1 mg via INTRAVENOUS

## 2023-09-01 MED ORDER — CLONIDINE HCL (ANALGESIA) 100 MCG/ML EP SOLN
EPIDURAL | Status: DC | PRN
  Administered 2023-09-01: 80 ug

## 2023-09-01 MED ORDER — ONDANSETRON HCL 4 MG/2ML IJ SOLN
INTRAMUSCULAR | Status: DC | PRN
  Administered 2023-09-01: 4 mg via INTRAVENOUS

## 2023-09-01 MED ORDER — MIDAZOLAM HCL 2 MG/2ML IJ SOLN
INTRAMUSCULAR | Status: AC
Start: 2023-09-01 — End: ?
  Filled 2023-09-01: qty 2

## 2023-09-01 MED ORDER — FENTANYL CITRATE (PF) 100 MCG/2ML IJ SOLN
50.0000 ug | Freq: Once | INTRAMUSCULAR | Status: AC
  Administered 2023-09-01: 50 ug via INTRAVENOUS

## 2023-09-01 SURGICAL SUPPLY — 60 items
BIT DRILL 2X3.5 HAND QK RELEAS (BIT) IMPLANT
BIT DRILL 2X3.5 QUICK RELEASE (BIT) ×1
BLADE SURG 15 STRL LF DISP TIS (BLADE) ×1 IMPLANT
BNDG COHESIVE 1X5 TAN STRL LF (GAUZE/BANDAGES/DRESSINGS) IMPLANT
BNDG ELASTIC 3INX 5YD STR LF (GAUZE/BANDAGES/DRESSINGS) ×1 IMPLANT
BNDG ELASTIC 4INX 5YD STR LF (GAUZE/BANDAGES/DRESSINGS) IMPLANT
BNDG ESMARK 4X9 LF (GAUZE/BANDAGES/DRESSINGS) ×1 IMPLANT
BNDG GAUZE DERMACEA FLUFF 4 (GAUZE/BANDAGES/DRESSINGS) ×1 IMPLANT
BUR PEAR (BURR) IMPLANT
BUR SABER DIAMOND 3.0 (BURR) IMPLANT
CHLORAPREP W/TINT 26 (MISCELLANEOUS) ×1 IMPLANT
CORD BIPOLAR FORCEPS 12FT (ELECTRODE) ×1 IMPLANT
COVER BACK TABLE 60X90IN (DRAPES) ×1 IMPLANT
CUFF TOURN SGL QUICK 18X4 (TOURNIQUET CUFF) ×1 IMPLANT
CUFF TRNQT CYL 24X4X16.5-23 (TOURNIQUET CUFF) IMPLANT
DRAPE EXTREMITY T 121X128X90 (DISPOSABLE) ×1 IMPLANT
DRAPE OEC MINIVIEW 54X84 (DRAPES) ×1 IMPLANT
DRAPE SURG 17X23 STRL (DRAPES) ×1 IMPLANT
GAUZE SPONGE 4X4 12PLY STRL (GAUZE/BANDAGES/DRESSINGS) IMPLANT
GAUZE XEROFORM 1X8 LF (GAUZE/BANDAGES/DRESSINGS) ×1 IMPLANT
GLOVE BIO SURGEON STRL SZ7 (GLOVE) ×1 IMPLANT
GLOVE BIOGEL PI IND STRL 6.5 (GLOVE) IMPLANT
GLOVE BIOGEL PI IND STRL 7.0 (GLOVE) IMPLANT
GLOVE SURG SS PI 6.5 STRL IVOR (GLOVE) IMPLANT
GOWN STRL REUS W/ TWL LRG LVL3 (GOWN DISPOSABLE) ×2 IMPLANT
GUIDEWIRE ORTHO MINI ACTK .045 (WIRE) IMPLANT
NDL HYPO 25X1 1.5 SAFETY (NEEDLE) IMPLANT
NEEDLE HYPO 25X1 1.5 SAFETY (NEEDLE) IMPLANT
NS IRRIG 1000ML POUR BTL (IV SOLUTION) ×1 IMPLANT
PACK BASIN DAY SURGERY FS (CUSTOM PROCEDURE TRAY) ×1 IMPLANT
PAD CAST 3X4 CTTN HI CHSV (CAST SUPPLIES) IMPLANT
PLATE STRAIGHT 10HOLE 1.3MM (Plate) IMPLANT
PLATE TACK (WIRE) ×3
PUTTY DBM STAGRAFT 2CC (Putty) IMPLANT
SCREW BONE LAG 2.3X12MM HEXA (Screw) IMPLANT
SCREW BONE LOCK 2.3X14MM HEXA (Screw) IMPLANT
SCREW BONE NL 2.3X14MM HEXA (Screw) IMPLANT
SCREW LAG 2.3X12MM (Screw) ×1 IMPLANT
SCREW LOCK 2.3X14 (Screw) ×1 IMPLANT
SCREW LOCK HEX MULTI 2.3X10 (Screw) IMPLANT
SCREW LOCK HEX MULTI 2.3X12 (Screw) IMPLANT
SCREW LOCK HEX MULTI 2.3X9 (Screw) IMPLANT
SCREW NON LOCK 2.3X14 (Screw) ×1 IMPLANT
SCREW NONLOCK 2.3X13MM (Screw) IMPLANT
SHEET MEDIUM DRAPE 40X70 STRL (DRAPES) ×1 IMPLANT
SLEEVE SCD COMPRESS KNEE MED (STOCKING) IMPLANT
SLING ARM FOAM STRAP LRG (SOFTGOODS) IMPLANT
SPLINT FIBERGLASS 4X30 (CAST SUPPLIES) IMPLANT
SPLINT PLASTER CAST XFAST 4X15 (CAST SUPPLIES) ×10 IMPLANT
SUCTION TUBE FRAZIER 10FR DISP (SUCTIONS) IMPLANT
SUT ETHIBOND 3-0 V-5 (SUTURE) IMPLANT
SUT ETHILON 4 0 PS 2 18 (SUTURE) ×1 IMPLANT
SUT MNCRL AB 3-0 PS2 18 (SUTURE) ×1 IMPLANT
SUT MNCRL AB 4-0 PS2 18 (SUTURE) IMPLANT
SUT VIC AB 4-0 PS2 18 (SUTURE) IMPLANT
SYR BULB EAR ULCER 3OZ GRN STR (SYRINGE) IMPLANT
SYR CONTROL 10ML LL (SYRINGE) IMPLANT
TACK PLATE ORTHO 40MM (WIRE) IMPLANT
TOWEL GREEN STERILE FF (TOWEL DISPOSABLE) ×2 IMPLANT
UNDERPAD 30X36 HEAVY ABSORB (UNDERPADS AND DIAPERS) ×1 IMPLANT

## 2023-09-01 NOTE — Op Note (Signed)
Date of Surgery: 09/01/2023  INDICATIONS: Patient is a 67 y.o.-year-old male with severe right index MCP osteoarthritis that has failed several years of extensive conservative management including bracing, activity modification, oral anti-inflammatory medication, and multiple corticosteroid injections.  The pain at the index MCP joint is significantly interfering with his daily activities and quality of life.  Given his very high activity level with multiple sports including tennis and golf, we were concerned about the longevity of a Silastic arthroplasty given the potential lateral stresses on the index MCP joint with his activities.  We therefore planned for index MCP arthrodesis.  Risks, benefits, and alternatives to surgery were again discussed with the patient in the preoperative area. The patient wishes to proceed with surgery.  Informed consent was signed after our discussion.   PREOPERATIVE DIAGNOSIS:  Index MCP osteoarthritis  POSTOPERATIVE DIAGNOSIS: Same.  PROCEDURE:  Index MCP arthroplasty   SURGEON: Waylan Rocher, M.D.  ASSIST: None  ANESTHESIA: Regional, MAC  IV FLUIDS AND URINE: See anesthesia.  ESTIMATED BLOOD LOSS: 10 mL.  IMPLANTS:  Implant Name Type Inv. Item Serial No. Manufacturer Lot No. LRB No. Used Action  PLATE STRAIGHT [id number] 1.3MM - [id number] Plate PLATE STRAIGHT [id number] 1.3MM  [employer] ON STERILE SET Right 1 Implanted  SCREW LOCK HEX MULTI 2.3X9 - [id number] Screw SCREW LOCK HEX MULTI 2.3X9  [employer] ON STERILE SET Right 2 Implanted  SCREW NONLOCK 2.3X13MM - [id number] Screw SCREW NONLOCK 2.3X13MM  [employer] ON STERILE SET Right 1 Implanted  SCREW NON LOCK 2.3X14 - [id number] Screw SCREW NON LOCK 2.3X14  [employer] ON STERILE SET Right 1 Implanted  SCREW LAG 2.3X12MM - [id number] Screw SCREW LAG 2.3X12MM  [employer] ON STERILE SET Right 1 Implanted and Explanted  SCREW LOCK HEX MULTI 2.3X10 - [id number] Screw SCREW LOCK HEX MULTI 2.3X10  [employer] ON STERILE SET Right 1 Implanted  SCREW LOCK HEX MULTI 2.3X12 - [id number] Screw SCREW LOCK HEX MULTI 2.3X12  [employer] ON STERILE SET Right 2 Implanted  SCREW LOCK 2.3X14 - [id number] Screw SCREW LOCK 2.3X14  [employer] ON STERILE SET Right 1 Implanted  PUTTY DBM STAGRAFT 2CC - [id number] Putty PUTTY DBM STAGRAFT 2CC  [employer](ORTH,TRAU,BIO,SG) [id number] Right 1 Implanted     DRAINS: None  COMPLICATIONS: None  DESCRIPTION OF PROCEDURE: The patient was met in the preoperative holding area where the surgical site was marked and the consent form was signed.  Patient remained on the stretcher.  A hand table was placed adjacent to the right upper extremity and locked into place.  All bony prominences were well padded.  A tourniquet was applied to the right upper arm.  Monitored anesthesia was induced.  The operative extremity was prepped and draped in the usual and sterile fashion.  A formal time-out was performed to confirm that this was the correct patient, surgery, side, and site.   Following formal timeout, the limb was gently exsanguinated with an Esmarch bandage and the tourniquet inflated to 250 mmHg.  I began by making a curvilinear incision along the dorsal aspect of the index finger over the MCP joint such that the split in the extensor apparatus would not be in line with the skin incision.  Full-thickness skin flaps were developed.  Small crossing vessels were coagulated as needed.  The extensor apparatus was divided from distal to proximal over the MCP joint continuing into the interval between the [facility] and EIP tendons.  The MCP capsule was divided.  The  MCP joint was encountered.  There was severe osteoarthritis present with large osteophytes and complete eburnation of both the head of the metacarpal and the base of the proximal phalanx.  The collateral ligaments were released allowing access to both the metacarpal head and proximal phalangeal base.  A rondure was used initially to remove  the articular cartilage from the metacarpal head.  A bur was then used to further around the metacarpal head and ensure good bleeding cancellous bone was exposed.  A similar process was performed at the proximal phalangeal base.  Given the significant eburnation, a bur was used to create a cup shape of the proximal phalangeal base with bleeding cancellous bone exposed circumferentially.  Following this bony work, the joint was reduced and pinned in place using two 0.045 inch K wires.  The finger was held in approximately 25 degrees of flexion at the MCP joint.  This was confirmed using a goniometer.  The joint appropriately reduced, a 10 hole compression plate was selected.  This was bent to approximately 30 degrees using the goniometer as a template.  The plate was applied to the dorsal aspect of the metacarpal and proximal phalanx and held in place with 2 olive wires to assess the plate position.  The plate seem to be appropriately seated with 4 holes proximal and distal to allow for adequate cortical fixation.  The plate was then fixed distally using bicortical locking screws.  I then drilled into the oblong hole proximally to allow for compression at the arthrodesis site.  A bicortical nonlocking screw was then advanced in the oblong hole proximally.  Before the screw engaged the plate, the 2 K wires were removed and manual compression held across the arthrodesis site.  The cortical screw was then advanced to compress through the plate.  The remaining holes in the proximal portion of the plate over the metacarpal were then filled with bicortical locking and nonlocking screws taking care that the screws were not too long so as to irritate the flexor tendons.  The distal portion of the plate over the proximal phalanx was similarly filled with bicortical locking screws again taking care that the screws were not too long and did not interfere with the flexor apparatus.  AP and lateral view show that the plate was  appropriately positioned.  The finger was not malrotated clinically on close inspection.  There is a very small gap at the radial aspect of the joint likely secondary to the bony work.  This was filled using an ostial inductive demineralized bone matrix product.  Arthrodesis site seemed appropriately compressed and well aligned.  The wound was then thoroughly irrigated with copious sterile saline.  The extensor apparatus was closed in a combination of figure-of-eight and running fashion using a 3-0 Ethibond suture.  The skin was closed using a 4-0 Monocryl suture in buried interrupted fashion followed by a 4-0 nylon suture in horizontal mattress fashion.  The tourniquet was deflated and hemostasis achieved with direct pressure over the wound.  All fingertips were warm and well-perfused at the end of the procedure.  The wound was then dressed with Xeroform, folded 4 x 4's, cast padding, and a well-padded radial gutter splint was applied.  The patient was reversed from sedation.  All counts were correct x 2 at the end of the procedure.  The patient was then taken to the PACU in stable condition.   POSTOPERATIVE PLAN: Will be discharged to home with appropriate pain medication and discharge instructions.  I  will see him back in 10 to 14 days for his first postop visit with repeat x-rays out of the splint.  Waylan Rocher, MD 11:50 AM

## 2023-09-01 NOTE — Interval H&P Note (Signed)
History and Physical Interval Note:  09/01/2023 8:45 AM  Robert Blair  has presented today for surgery, with the diagnosis of Right index metacarpal phalangeal  osteoarthritis.  The various methods of treatment have been discussed with the patient and family. After consideration of risks, benefits and other options for treatment, the patient has consented to  Procedure(s) with comments: Right index metacarpal phalangeal  arthrodesis (Right) - regional as a surgical intervention.  The patient's history has been reviewed, patient examined, no change in status, stable for surgery.  I have reviewed the patient's chart and labs.  Questions were answered to the patient's satisfaction.     Avrielle Fry

## 2023-09-01 NOTE — Anesthesia Postprocedure Evaluation (Signed)
Anesthesia Post Note  Patient: Robert Blair  Procedure(s) Performed: Right index metacarpal phalangeal  arthrodesis (Right: Finger)     Patient location during evaluation: PACU Anesthesia Type: Regional Level of consciousness: awake and alert Pain management: pain level controlled Vital Signs Assessment: post-procedure vital signs reviewed and stable Respiratory status: spontaneous breathing Cardiovascular status: stable Anesthetic complications: no   No notable events documented.  Last Vitals:  Vitals:   09/01/23 1215 09/01/23 1248  BP:  113/78  Pulse: 93 96  Resp: 20 18  Temp:  37.2 C  SpO2: 93% 94%    Last Pain:  Vitals:   09/01/23 1248  TempSrc: Oral  PainSc: 0-No pain                 Lewie Loron

## 2023-09-01 NOTE — H&P (Signed)
HAND SURGERY   HPI: Patient is a 67 y.o. male who presents with severe osteoarthritis of the right index MCPJ that has failed extensive nonsurgical management for several years including activity modification, bracing, anti-inflammatory medications, and multiple corticosteroid injections.  He still describes severe pain in the index MCPJ with any activity.  This is severely interfering with his daily activities and quality of life.  He presents today for surgical management.  Patient denies any changes to their medical history or new systemic symptoms today.    Past Medical History:  Diagnosis Date   ADHD (attention deficit hyperactivity disorder)    Anxiety    Arthritis    Cancer (HCC) 2016   Basal cell   Complication of anesthesia    URINARY RETENTION   Depression    History of adenomatous polyp of colon    2008/  2013/  2016   History of basal cell carcinoma excision    2015  nose   History of urinary retention    10-30-2014  POST OP   Hypothyroidism    Seasonal allergies    Unspecified essential hypertension    Past Surgical History:  Procedure Laterality Date   ARTHRODESIS METATARSALPHALANGEAL JOINT (MTPJ) Right 08/13/2020   Procedure: FUSION RIGHT GREAT TOE METATARSALPHALANGEAL JOINT (MTPJ);  Surgeon: Nadara Mustard, MD;  Location:  [facility];  Service: Orthopedics;  Laterality: Right;   bladder stones  07/2023   bladder stones   CHOLECYSTECTOMY N/A 10/02/2022   Procedure: LAPAROSCOPIC CHOLECYSTECTOMY;  Surgeon: Axel Filler, MD;  Location: [facility];  Service: General;  Laterality: N/A;   COLONOSCOPY  last one 02-25-2015   GANGLION CYST EXCISION Right 08/04/2021   Procedure: EXCISION MASS RIGHT MIDDLE FINGER;  Surgeon: Marlyne Beards, MD;  Location: [facility];  Service: Orthopedics;  Laterality: Right;   HYDROCELE EXCISION Right 08/26/2015   Procedure: RIGHT HYDROCELECTOMY ADULT;  Surgeon: Marcine Matar, MD;  Location: [facility];  Service: Urology;  Laterality: Right;   INGUINAL HERNIA REPAIR Right 10/29/2014   Procedure: LAPAROSCOPIC REPAIR RECURRENT RIGHT INGUINAL ;  Surgeon: Claud Kelp, MD;  Location: [facility];  Service: General;  Laterality: Right;   INGUINAL HERNIA REPAIR Right 1984   INSERTION OF MESH Right 10/29/2014   Procedure: INSERTION OF MESH;  Surgeon: Claud Kelp, MD;  Location: [facility];  Service: General;  Laterality: Right;   INSERTION OF MESH N/A 06/17/2020   Procedure: INSERTION OF MESH;  Surgeon: Abigail Miyamoto, MD;  Location: [facility];  Service: General;  Laterality: N/A;   POLYPECTOMY     TOE OSTEOTOMY Bilateral right 2002/  left 2013   great toe   TOTAL HIP ARTHROPLASTY Left 09/22/2019   Procedure: LEFT TOTAL HIP ARTHROPLASTY ANTERIOR APPROACH;  Surgeon: Kathryne Hitch, MD;  Location: [facility];  Service: Orthopedics;  Laterality: Left;   UMBILICAL HERNIA REPAIR N/A 06/17/2020   Procedure: UMBILICAL HERNIA REPAIR;  Surgeon: Abigail Miyamoto, MD;  Location: [facility];  Service: General;  Laterality: N/A;   Social History   Socioeconomic History   Marital status: Married    Spouse name: Not on file   Number of children: Not on file   Years of education: Not on file   Highest education level: Not on file  Occupational History   Occupation: self employed  Tobacco Use   Smoking status: Never   Smokeless tobacco: Never  Vaping Use   Vaping status: Never Used  Substance and Sexual Activity  Alcohol use: Yes    Comment: social   Drug use: No   Sexual activity: Yes  Other Topics Concern   Not on file  Social History Narrative   Not on file   Social Drivers of Health   Financial Resource Strain: Not on file  Food Insecurity: Not on file  Transportation Needs: Not on file  Physical Activity: Not on file  Stress: Not on file  Social Connections: Not on file   Family History  Problem Relation Age of Onset   Breast  cancer Mother    Hypertension Mother    Depression Mother    Cancer - Other Mother        unknown GI cancer   Colon polyps Father    Stroke Father    Alzheimer's disease Father    Colon cancer Neg Hx    Rectal cancer Neg Hx    Stomach cancer Neg Hx    Liver disease Neg Hx    - negative except otherwise stated in the family history section No Known Allergies Prior to Admission medications   Medication Sig Start Date End Date Taking? Authorizing Provider  amLODipine (NORVASC) 10 MG tablet Take 10 mg by mouth daily.    Yes [provider]  busPIRone (BUSPAR) 10 MG tablet Take 10 mg by mouth 3 (three) times daily.   Yes [provider]  Cholecalciferol (VITAMIN D3) 50 MCG (2000 UT) TABS Take 10,000 Units by mouth daily.   Yes [provider]  clomiPHENE (CLOMID) 50 MG tablet Take 50 mg by mouth every other day. In the morning   Yes [provider]  diclofenac (VOLTAREN) 75 MG EC tablet TAKE ONE TABLET BY MOUTH TWICE A DAY 11/01/21  Yes Philana Younis, Billey Gosling, MD  doxazosin (CARDURA) 1 MG tablet Take 1 mg by mouth daily. 01/28/23  Yes [provider]  EX-LAX ULTRA 5 MG EC tablet Take 10 mg by mouth 2 (two) times daily.   Yes [provider]  fexofenadine (ALLEGRA) 180 MG tablet Take 180 mg by mouth daily as needed for allergies or rhinitis.   Yes [provider]  fluticasone (FLONASE) 50 MCG/ACT nasal spray Place 1-2 sprays into both nostrils daily as needed for allergies.  02/12/15  Yes [provider]  levothyroxine (SYNTHROID, LEVOTHROID) 112 MCG tablet Take 112 mcg by mouth daily before breakfast.  01/10/18  Yes [provider]  MAGNESIUM PO Take 400 mg by mouth daily.    Yes [provider]  metoprolol succinate (TOPROL-XL) 50 MG 24 hr tablet Take 50 mg by mouth daily. 03/21/23  Yes [provider]  naltrexone (DEPADE) 50 MG tablet Take 50 mg by mouth daily. 11/09/22  Yes [provider]   naproxen sodium (ALEVE) 220 MG tablet Take 440-660 mg by mouth 2 (two) times daily as needed (pain.).   Yes [provider]  ondansetron (ZOFRAN-ODT) 4 MG disintegrating tablet Take 1 tablet (4 mg total) by mouth every 6 (six) hours as needed for nausea or vomiting. 06/07/23  Yes Unk Lightning, PA  POTASSIUM PO Take 1 tablet by mouth daily.    Yes [provider]  tamsulosin (FLOMAX) 0.4 MG CAPS capsule Take 0.4 mg by mouth daily.   Yes [provider]  TURMERIC PO Take 400 mg by mouth daily.    Yes [provider]  valsartan-hydrochlorothiazide (DIOVAN-HCT) 320-25 MG per tablet Take 1 tablet by mouth daily.    Yes [provider]  VYVANSE 70  MG capsule Take 70 mg by mouth daily as needed (ADD).  08/25/19  Yes [provider]   DG MINI C-ARM IMAGE ONLY Result Date: 09/01/2023 There is no interpretation for this exam.  This order is for images obtained during a surgical procedure.  Please See "Surgeries" Tab for more information regarding the procedure.   - Positive ROS: All other systems have been reviewed and were otherwise negative with the exception of those mentioned in the HPI and as above.  Physical Exam: General: No acute distress, resting comfortably Cardiovascular: BUE warm and well perfused, normal rate Respiratory: Normal WOB on RA Skin: Warm and dry Neurologic: Sensation intact distally Psychiatric: Patient is at baseline mood and affect  Right Upper Extremity  There is a healing wound at the tip of the thumb.  There appears to be healthy appearing granulation tissue at the radial aspect of the wound bed.  The remainder of the wound has healed.  There is no erythema.  There is no drainage.  He has persistent and significant stiffness of the index finger at the MCP joint active range of motion of approximately 10 to 45 degrees with discomfort.  Active range of motion of the PIP joint is approximately 0 to 75 degrees.   He has pain and crepitus with grind test at the index MCP joint.  He has full and painless range of motion of the adjacent fingers.  Sensation is intact light touch throughout the hand.  His hand is warm and well-perfused with brisk capillary fill.  Assessment: Patient is a 67 year old male with right index finger MCP osteoarthritis that has failed extensive conservative management for several years including activity modification, bracing, anti-inflammatory medications, and multiple corticosteroid injections.  Plan: Plan for right index finger MCP arthrodesis using a locking plate construct.  Regarding the wound on the patient's thumb, we did discuss delaying the surgery until this wound heals completely, however, patient is not interested in any further delay and would like to proceed.  We discussed that there is a small risk of infection given this wound although the wound appears clean and healthy.  We reviewed the other risks of surgery which include nonunion, malunion, stiffness of the PIP or DIP joints, delayed wound healing, persistent pain, hardware complication, need for additional surgery.  Also reviewed the expected postoperative course and nature of bone healing.  We reviewed the need for diligent postoperative rehab.  Questionss were encouraged and answered.  He has elected to proceed.  Informed consent was signed.  Marlyne Beards, M.D. [facility] 8:38 AM

## 2023-09-01 NOTE — Transfer of Care (Signed)
Immediate Anesthesia Transfer of Care Note  Patient: Robert Blair  Procedure(s) Performed: Right index metacarpal phalangeal  arthrodesis (Right: Finger)  Patient Location: PACU  Anesthesia Type:MAC combined with regional for post-op pain  Level of Consciousness: sedated  Airway & Oxygen Therapy: Patient Spontanous Breathing and Patient connected to face mask oxygen  Post-op Assessment: Report given to RN and Post -op Vital signs reviewed and stable  Post vital signs: Reviewed and stable  Last Vitals:  Vitals Value Taken Time  BP    Temp    Pulse 85 09/01/23 1147  Resp    SpO2 91 % 09/01/23 1147  Vitals shown include unfiled device data.  Last Pain:  Vitals:   09/01/23 0759  PainSc: 0-No pain      Patients Stated Pain Goal: 6 (09/01/23 0759)  Complications: No notable events documented.

## 2023-09-01 NOTE — Progress Notes (Signed)
 Assisted Dr. Renold Don with right, axillary, ultrasound guided block. Side rails up, monitors on throughout procedure. See vital signs in flow sheet. Tolerated Procedure well.

## 2023-09-01 NOTE — Discharge Instructions (Addendum)
Robert Blair, M.D. Hand Surgery  POST-OPERATIVE DISCHARGE INSTRUCTIONS   PRESCRIPTIONS: You may have been given a prescription to be taken as directed for post-operative pain control.  You may also take over the counter ibuprofen/aleve and tylenol for pain. Take this as directed on the packaging. Do not exceed 3000 mg tylenol/acetaminophen in 24 hours.  Ibuprofen 600-800 mg (3-4) tablets by mouth every 6 hours as needed for pain.  OR Aleve 2 tablets by mouth every 12 hours (twice daily) as needed for pain.  AND/OR Tylenol 1000 mg (2 tablets) every 8 hours as needed for pain.  Please use your pain medication carefully, as refills are limited and you may not be provided with one.  As stated above, please use over the counter pain medicine - it will also be helpful with decreasing your swelling.    ANESTHESIA: After your surgery, post-surgical discomfort or pain is likely. This discomfort can last several days to a few weeks. At certain times of the day your discomfort may be more intense.   Did you receive a nerve block?  A nerve block can provide pain relief for one hour to two days after your surgery. As long as the nerve block is working, you will experience little or no sensation in the area the surgeon operated on.  As the nerve block wears off, you will begin to experience pain or discomfort. It is very important that you begin taking your prescribed pain medication before the nerve block fully wears off. Treating your pain at the first sign of the block wearing off will ensure your pain is better controlled and more tolerable when full-sensation returns. Do not wait until the pain is intolerable, as the medicine will be less effective. It is better to treat pain in advance than to try and catch up.   General Anesthesia:  If you did not receive a nerve block during your surgery, you will need to start taking your pain medication shortly after your surgery and should continue  to do so as prescribed by your surgeon.     ICE AND ELEVATION: You may use ice for the first 48-72 hours, but it is not critical.   Motion of your fingers is very important to decrease the swelling.  Elevation, as much as possible for the next 48 hours, is critical for decreasing swelling as well as for pain relief. Elevation means when you are seated or lying down, you hand should be at or above your heart. When walking, the hand needs to be at or above the level of your elbow.  If the bandage gets too tight, it may need to be loosened. Please contact our office and we will instruct you in how to do this.    SURGICAL BANDAGES:  Keep your dressing and/or splint clean and dry at all times.  Do not remove until you are seen again in the office.  If careful, you may place a plastic bag over your bandage and tape the end to shower, but be careful, do not get your bandages wet.     HAND THERAPY:  You will be contacted to set up your first hand therapy appointment.   ACTIVITY AND WORK: You are encouraged to move any fingers which are not in the bandage.  Light use of the fingers is allowed to assist the other hand with daily hygiene and eating, but strong gripping or lifting is often uncomfortable and should be avoided.  You might miss a variable  period of time from work and hopefully this issue has been discussed prior to surgery. You may not do any heavy work with your affected hand for about 2 weeks.    [facility] Second Floor, [address] [place], [place] [address] [phone]      Post Anesthesia Home Care Instructions  Activity: Get plenty of rest for the remainder of the day. A responsible individual must stay with you for 24 hours following the procedure.  For the next 24 hours, DO NOT: -Drive a car -Advertising copywriter -Drink alcoholic beverages -Take any medication unless instructed by your physician -Make any legal decisions or sign important  papers.  Meals: Start with liquid foods such as gelatin or soup. Progress to regular foods as tolerated. Avoid greasy, spicy, heavy foods. If nausea and/or vomiting occur, drink only clear liquids until the nausea and/or vomiting subsides. Call your physician if vomiting continues.  Special Instructions/Symptoms: Your throat may feel dry or sore from the anesthesia or the breathing tube placed in your throat during surgery. If this causes discomfort, gargle with warm salt water. The discomfort should disappear within 24 hours.  If you had a scopolamine patch placed behind your ear for the management of post- operative nausea and/or vomiting:  1. The medication in the patch is effective for 72 hours, after which it should be removed.  Wrap patch in a tissue and discard in the trash. Wash hands thoroughly with soap and water. 2. You may remove the patch earlier than 72 hours if you experience unpleasant side effects which may include dry mouth, dizziness or visual disturbances. 3. Avoid touching the patch. Wash your hands with soap and water after contact with the patch.

## 2023-09-01 NOTE — Anesthesia Procedure Notes (Signed)
Anesthesia Regional Block: Axillary brachial plexus block   Pre-Anesthetic Checklist: , timeout performed,  Correct Patient, Correct Site, Correct Laterality,  Correct Procedure, Correct Position, site marked,  Risks and benefits discussed,  Surgical consent,  Pre-op evaluation,  At surgeon's request and post-op pain management  Laterality: Upper and Right  Prep: chloraprep       Needles:  Injection technique: Single-shot  Needle Type: Stimiplex          Additional Needles:   Procedures:,,,, ultrasound used (permanent image in chart),,    Narrative:  Start time: 09/01/2023 8:35 AM End time: 09/01/2023 8:55 AM Injection made incrementally with aspirations every 5 mL.  Performed by: Personally  Anesthesiologist: Lewie Loron, MD  Additional Notes: BP cuff, SpO2 and EKG monitors applied. Sedation begun. Nerve location verified with ultrasound. Anesthetic injected incrementally, slowly, and after neg aspirations under direct u/s guidance. Good perineural spread. Tolerated well.

## 2023-09-02 ENCOUNTER — Encounter (HOSPITAL_BASED_OUTPATIENT_CLINIC_OR_DEPARTMENT_OTHER): Payer: Self-pay | Admitting: Orthopedic Surgery

## 2023-09-23 ENCOUNTER — Other Ambulatory Visit: Payer: Self-pay | Admitting: Medical Genetics

## 2023-09-26 ENCOUNTER — Encounter (HOSPITAL_COMMUNITY): Payer: Self-pay

## 2023-09-26 ENCOUNTER — Other Ambulatory Visit: Payer: Self-pay

## 2023-09-26 ENCOUNTER — Emergency Department (HOSPITAL_COMMUNITY)
Admission: EM | Admit: 2023-09-26 | Discharge: 2023-09-26 | Disposition: A | Discharge status: Home / Self Care | Payer: Medicare Other | Attending: Emergency Medicine | Admitting: Emergency Medicine

## 2023-09-26 DIAGNOSIS — R339 Retention of urine, unspecified: Secondary | ICD-10-CM | POA: Diagnosis present

## 2023-09-26 MED ORDER — LIDOCAINE HCL URETHRAL/MUCOSAL 2 % EX GEL
1.0000 | Freq: Once | CUTANEOUS | Status: DC
  Filled 2023-09-26: qty 11

## 2023-09-26 NOTE — ED Triage Notes (Addendum)
 Pt has been having issues urinating for 5 days, now is having just a trickle and feels like he can't urinate since 1100. Pt is currently on abx for UTI, awaiting urine culture results. Pain with urination

## 2023-09-26 NOTE — ED Provider Notes (Signed)
 [place] [facility] AT [facility] Provider Note   CSN: [id number] Arrival date & time: 09/26/23  1400     History  Chief Complaint  Patient presents with   Urinary Retention    Robert Blair is a 68 y.o. male, history of bladder stones, who presents to the ED secondary to difficulty urinating that is been going on for the last 10 days.  He has a history of bladder stones, for which he sees Dr. Carolee for, he was seen in a few days ago and started on antibiotic, Augmentin , and given a shot of ceftriaxone , but he states his urinary hesitancy/difficulty with stream has persisted, and now he is having leakage, difficulty urinating.  He states it feels very similar to when he had bladder stones, except for this time he has pain around the penile head.  And he has attempted to try to urinate, but now it is just dripping out.  Denies any abdominal pain, blood in his urine.     Home Medications Prior to Admission medications   Medication Sig Start Date End Date Taking? Authorizing Provider  amLODipine (NORVASC) 10 MG tablet Take 10 mg by mouth daily.     [provider]  busPIRone (BUSPAR) 10 MG tablet Take 10 mg by mouth 3 (three) times daily.    [provider]  Cholecalciferol (VITAMIN D3) 50 MCG (2000 UT) TABS Take 10,000 Units by mouth daily.    [provider]  clomiPHENE (CLOMID) 50 MG tablet Take 50 mg by mouth every other day. In the morning    [provider]  diclofenac  (VOLTAREN ) 75 MG EC tablet TAKE ONE TABLET BY MOUTH TWICE A DAY 11/01/21   Romona Harari, MD  doxazosin (CARDURA) 1 MG tablet Take 1 mg by mouth daily. 01/28/23   [provider]  EX-LAX ULTRA 5 MG EC tablet Take 10 mg by mouth 2 (two) times daily.    [provider]  fexofenadine (ALLEGRA) 180 MG tablet Take 180 mg by mouth daily as needed for allergies or rhinitis.    [provider]  fluticasone (FLONASE) 50 MCG/ACT nasal spray  Place 1-2 sprays into both nostrils daily as needed for allergies.  02/12/15   [provider]  levothyroxine (SYNTHROID, LEVOTHROID) 112 MCG tablet Take 112 mcg by mouth daily before breakfast.  01/10/18   [provider]  MAGNESIUM PO Take 400 mg by mouth daily.     [provider]  metoprolol succinate (TOPROL-XL) 50 MG 24 hr tablet Take 50 mg by mouth daily. 03/21/23   [provider]  naltrexone (DEPADE) 50 MG tablet Take 50 mg by mouth daily. 11/09/22   [provider]  naproxen sodium (ALEVE) 220 MG tablet Take 440-660 mg by mouth 2 (two) times daily as needed (pain.).    [provider]  ondansetron  (ZOFRAN -ODT) 4 MG disintegrating tablet Take 1 tablet (4 mg total) by mouth every 6 (six) hours as needed for nausea or vomiting. 06/07/23   Beather Delon Gibson, PA  POTASSIUM PO Take 1 tablet by mouth daily.     [provider]  tamsulosin  (FLOMAX ) 0.4 MG CAPS capsule Take 0.4 mg by mouth daily.    [provider]  TURMERIC PO Take 400 mg by mouth daily.     [provider]  valsartan-hydrochlorothiazide (DIOVAN-HCT) 320-25 MG per tablet Take 1 tablet by mouth daily.     [provider]  VYVANSE 70 MG capsule Take 70  mg by mouth daily as needed (ADD).  08/25/19   [provider]      Allergies    Patient has no known allergies.    Review of Systems   Review of Systems  Genitourinary:  Positive for penile pain. Negative for hematuria.    Physical Exam Updated Vital Signs BP (!) 182/117 (BP Location: Left Arm)   Pulse (!) 109   Temp 98 F (36.7 C) (Oral)   Resp 18   Ht 5' 8 (1.727 m)   Wt 104.3 kg   SpO2 97%   BMI 34.97 kg/m  Physical Exam Vitals and nursing note reviewed.  Constitutional:      General: He is not in acute distress.    Appearance: He is well-developed.  HENT:     Head: Normocephalic and atraumatic.  Eyes:     Conjunctiva/sclera: Conjunctivae normal.   Cardiovascular:     Rate and Rhythm: Normal rate and regular rhythm.     Heart sounds: No murmur heard. Pulmonary:     Effort: Pulmonary effort is normal. No respiratory distress.     Breath sounds: Normal breath sounds.  Abdominal:     Palpations: Abdomen is soft.     Tenderness: There is no abdominal tenderness.  Musculoskeletal:        General: No swelling.     Cervical back: Neck supple.  Skin:    General: Skin is warm and dry.     Capillary Refill: Capillary refill takes less than 2 seconds.  Neurological:     Mental Status: He is alert.  Psychiatric:        Mood and Affect: Mood normal.     ED Results / Procedures / Treatments   Labs (all labs ordered are listed, but only abnormal results are displayed) Labs Reviewed - No data to display  EKG None  Radiology No results found.  Procedures Procedures    Medications Ordered in ED Medications  lidocaine  (XYLOCAINE ) 2 % jelly 1 Application (has no administration in time range)    ED Course/ Medical Decision Making/ A&P                                 Medical Decision Making Patient with history of bladder stones, currently being treated for possible UTI, by Dr. Carolee.  Is having symptoms similar to bladder stones, except for penile pressure now.  Is now having dribbling from the penis, as that he cannot get a full stream.  He has no concern for STDs, or any trauma to the area.  I spoke with Dr. Elisabeth, from urology, she recommends placing an 18 coud in the patient and have him follow-up with urology.  Catheter placed, good urinary output now, patient feels relief.  Will have him follow-up with urology and continue Augmentin .    Final Clinical Impression(s) / ED Diagnoses Final diagnoses:  Urinary retention    Rx / DC Orders ED Discharge Orders     None         Latosha Gaylord, Lyle CROME, PA 09/26/23 1548    Charlyn Sora, MD 09/27/23 [phone]

## 2023-09-26 NOTE — Discharge Instructions (Signed)
 You were retaining you urine, likely secondary to an obstruction such as a bladder stone.  We have placed a catheter, please make sure you are taking care of the catheter as instructed above.  Please follow-up with Dr. Carolee, for further management.  Continue taking your antibiotic.

## 2023-10-01 ENCOUNTER — Ambulatory Visit: Payer: BC Managed Care – PPO | Admitting: Physician Assistant

## 2023-10-09 ENCOUNTER — Other Ambulatory Visit: Payer: Self-pay | Admitting: Urology

## 2023-10-09 MED ORDER — LIDOCAINE HCL 2 % EX GEL: 1.0000 | CUTANEOUS | 0 refills | Status: AC | PRN

## 2024-07-13 ENCOUNTER — Other Ambulatory Visit: Payer: Self-pay | Admitting: Medical Genetics

## 2024-07-13 DIAGNOSIS — Z006 Encounter for examination for normal comparison and control in clinical research program: Secondary | ICD-10-CM

## 2024-07-20 ENCOUNTER — Ambulatory Visit (INDEPENDENT_AMBULATORY_CARE_PROVIDER_SITE_OTHER): Admitting: Orthopedic Surgery

## 2024-07-20 ENCOUNTER — Other Ambulatory Visit (INDEPENDENT_AMBULATORY_CARE_PROVIDER_SITE_OTHER)

## 2024-07-20 DIAGNOSIS — M545 Low back pain, unspecified: Secondary | ICD-10-CM

## 2024-07-20 MED ORDER — METHYLPREDNISOLONE 4 MG PO TBPK: ORAL_TABLET | ORAL | 0 refills | Status: AC

## 2024-07-20 NOTE — Progress Notes (Signed)
 Orthopedic Spine Surgery Office Note   Assessment: Patient is a 68 y.o. male who was previously seen for a right sided foot drop. Comes in today with left buttock pain, possible L5 radiculopathy     Plan: -Patient has tried medrol  dosepak, cone strengthening, intramuscular steroid injection -Intramuscular steroid injection given today in office. Prescribed a medrol  does pak. Referred him to PT -Would consider a transforaminal injection as a next step if pain does not get better -Patient should return to office 6 weeks, x-rays at next visit: none     Left intramuscular injection note: The risks, benefits, alternative of left buttock intramuscular cortisone injection was covered with the patient. After that discussion, the patient elected to proceed. The skin overlying the left gluteus muscle was prepped with an alcohol based prep. The skin was anesthetized with ethyl chloride. A 20 gauge needle was used to inject 80mg  of depomedrol into the gluteus muscle under standard sterile technique. Needle was withdrawn and band aid was applied. Patient tolerated the procedure well.  ___________________________________________________________________________     History:   Patient is a 68 y.o. male who presents today to talk about his lumbar spine again. He has now developed acute onset of left buttock pain. He feels it in the low back and left buttock. No pain radiating past the buttock. No right sided symptoms. There was no trauma or injury that preceded the onset of pain. He notices it more when he is active and gets better with rest. Has not tried any treatments so far. No bowel or bladder incontinence. No saddle anesthesia. No weakness noted.    Treatments tried: medrol  dosepak, cone strengthening, intramuscular steroid injection     Physical Exam:   General: no acute distress, appears stated age Neurologic: alert, answering questions appropriately, following commands Respiratory: unlabored  breathing on room air, symmetric chest rise Psychiatric: appropriate affect, normal cadence to speech     MSK (spine):   -Strength exam                                                   Left                  Right EHL                              5/5                  -/5 TA                                 5/5                  5/5 GSC                             5/5                  5/5 Knee extension            5/5                  5/5 Hip flexion  5/5                  5/5   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 No evidence of instability on flexion/extension views. No fracture or dislocation seen.    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  Disc bulging and hypertrophic ligamentum flavum at L4/5.     Patient name: Robert Blair Patient MRN: [id number] Date of visit: 07/20/24

## 2024-08-08 ENCOUNTER — Ambulatory Visit (INDEPENDENT_AMBULATORY_CARE_PROVIDER_SITE_OTHER): Admitting: Physical Therapy

## 2024-08-08 ENCOUNTER — Encounter: Payer: Self-pay | Admitting: Physical Therapy

## 2024-08-08 ENCOUNTER — Telehealth: Payer: Self-pay | Admitting: Medical Genetics

## 2024-08-08 DIAGNOSIS — M6281 Muscle weakness (generalized): Secondary | ICD-10-CM

## 2024-08-08 DIAGNOSIS — R293 Abnormal posture: Secondary | ICD-10-CM

## 2024-08-08 DIAGNOSIS — M5459 Other low back pain: Secondary | ICD-10-CM

## 2024-08-08 DIAGNOSIS — Z006 Encounter for examination for normal comparison and control in clinical research program: Secondary | ICD-10-CM

## 2024-08-08 LAB — GENECONNECT MOLECULAR SCREEN

## 2024-08-08 NOTE — Therapy (Signed)
 OUTPATIENT PHYSICAL THERAPY THORACOLUMBAR EVALUATION   Patient Name: Robert Blair MRN: [id number] DOB:[birth year], 68 y.o., male Today's Date: 08/08/2024  END OF SESSION:  PT End of Session - 08/08/24 1227     Visit Number 1    Number of Visits 20    Date for Recertification  10/20/24    PT Start Time 0930    PT Stop Time 1010    PT Time Calculation (min) 40 min    Activity Tolerance Patient tolerated treatment well    Behavior During Therapy [facility] for tasks assessed/performed          Past Medical History:  Diagnosis Date   ADHD (attention deficit hyperactivity disorder)    Anxiety    Arthritis    Cancer (HCC) 2016   Basal cell   Complication of anesthesia    URINARY RETENTION   Depression    History of adenomatous polyp of colon    2008/  2013/  2016   History of basal cell carcinoma excision    2015  nose   History of urinary retention    10-30-2014  POST OP   Hypothyroidism    Seasonal allergies    Unspecified essential hypertension    Past Surgical History:  Procedure Laterality Date   ARTHRODESIS METATARSALPHALANGEAL JOINT (MTPJ) Right 08/13/2020   Procedure: FUSION RIGHT GREAT TOE METATARSALPHALANGEAL JOINT (MTPJ);  Surgeon: Harden Jerona GAILS, MD;  Location: [facility];  Service: Orthopedics;  Laterality: Right;   bladder stones  07/2023   bladder stones   CHOLECYSTECTOMY N/A 10/02/2022   Procedure: LAPAROSCOPIC CHOLECYSTECTOMY;  Surgeon: Rubin Calamity, MD;  Location: [facility];  Service: General;  Laterality: N/A;   COLONOSCOPY  last one 02-25-2015   FINGER ARTHRODESIS Right 09/01/2023   Procedure: Right index metacarpal phalangeal  arthrodesis;  Surgeon: Romona Harari, MD;  Location: [facility];  Service: Orthopedics;  Laterality: Right;  regional   GANGLION CYST EXCISION Right 08/04/2021   Procedure: EXCISION MASS RIGHT MIDDLE FINGER;  Surgeon: Romona Harari, MD;  Location: [facility];  Service:  Orthopedics;  Laterality: Right;   HYDROCELE EXCISION Right 08/26/2015   Procedure: RIGHT HYDROCELECTOMY ADULT;  Surgeon: Garnette Shack, MD;  Location: [facility];  Service: Urology;  Laterality: Right;   INGUINAL HERNIA REPAIR Right 10/29/2014   Procedure: LAPAROSCOPIC REPAIR RECURRENT RIGHT INGUINAL ;  Surgeon: Elon Pacini, MD;  Location: [facility];  Service: General;  Laterality: Right;   INGUINAL HERNIA REPAIR Right 1984   INSERTION OF MESH Right 10/29/2014   Procedure: INSERTION OF MESH;  Surgeon: Elon Pacini, MD;  Location: [facility];  Service: General;  Laterality: Right;   INSERTION OF MESH N/A 06/17/2020   Procedure: INSERTION OF MESH;  Surgeon: Vernetta Berg, MD;  Location: [facility];  Service: General;  Laterality: N/A;   POLYPECTOMY     TOE OSTEOTOMY Bilateral right 2002/  left 2013   great toe   TOTAL HIP ARTHROPLASTY Left 09/22/2019   Procedure: LEFT TOTAL HIP ARTHROPLASTY ANTERIOR APPROACH;  Surgeon: Vernetta Lonni GRADE, MD;  Location: [facility];  Service: Orthopedics;  Laterality: Left;   UMBILICAL HERNIA REPAIR N/A 06/17/2020   Procedure: UMBILICAL HERNIA REPAIR;  Surgeon: Vernetta Berg, MD;  Location: [facility];  Service: General;  Laterality: N/A;   Patient Active Problem List   Diagnosis Date Noted   Foot drop, right 02/12/2023   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 06/16/2021  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 06/16/2021   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 06/16/2021   Finger mass, right 06/16/2021   Hallux rigidus, right foot    Pain from implanted hardware    Hallux rigidus, left foot 07/30/2020   Pain in right shoulder 07/30/2020   Great toe pain, right 05/09/2020   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   Unilateral primary osteoarthritis, left hip 09/22/2019    Tendinitis, de Quervain's 06/02/2019   Pain of left hip joint 02/07/2019   Recurrent right inguinal hernia 10/29/2014   Encounter for general adult medical examination without abnormal findings 07/07/2013   Rhytides 01/06/2013   Skin laxity 01/06/2013   Hypertension 01/01/2012   Hypothyroidism 01/01/2012   History of adenomatous polyp of colon 01/01/2012    PCP: Henry Ingle, MD   REFERRING PROVIDER: Georgina Ozell LABOR, MD   REFERRING DIAG:  Diagnosis  M54.50 (ICD-10-CM) - Lumbar pain    Rationale for Evaluation and Treatment: Rehabilitation  THERAPY DIAG:  Muscle weakness (generalized)  Other low back pain  Abnormal posture  ONSET DATE: on/off for years  SUBJECTIVE:                                                                                                                                                                                           SUBJECTIVE STATEMENT: Patient is a 68 y.o. male who presents today c lumbar spine pain. He has now developed acute onset of left buttock pain. He feels it in the low back and left buttock. No pain radiating past the buttock. No right sided symptoms. There was no trauma or injury that preceded the onset of pain.   PERTINENT HISTORY:  Cancer basal cell, ADHD, anxiety, arthritis, depression, hypothyroidism,  Left THA, see other PMH  PAIN:  NPRS scale: no pain today, 9/10 at it's worse 3 weeks ago Pain location: low back, left gluteal/piriformis Pain description: constant, dull Aggravating factors: tennis, sleeping, bending Relieving factors: heat  PRECAUTIONS: None  WEIGHT BEARING RESTRICTIONS: No  FALLS:  Has patient fallen in last 6 months? No  LIVING ENVIRONMENT: Lives with: lives with their family Lives in: House/apartment Stairs: Yes: Internal: 15 steps; on left going up and External: 0 steps; none Has following equipment at home: None  OCCUPATION: retired  PLOF: Independent  PATIENT GOALS: Get back  to playing tennis and golf with no pain  Next MD Visit:    OBJECTIVE:   DIAGNOSTIC FINDINGS: 07/20/24 XRs of the lumbar spine from 07/20/2024 were independently reviewed and  interpreted, showing disc height loss at L4/5 and  L5/S1. No evidence of  instability on flexion/extension views. No fracture or dislocation seen.   PATIENT SURVEYS:  XRs of the lumbar spine from 07/20/2024 were independently reviewed and  interpreted, showing disc height loss at L4/5 and L5/S1. No evidence of  instability on flexion/extension views. No fracture or dislocation seen.   Patient-Specific Activity Scoring Scheme  0 represents "unable to perform." 10 represents "able to perform at prior level. 0 1 2 3 4 5 6 7 8 9  10 (Date and Score)   Activity Eval  08/08/24 When pain was at it's worse    1. golf  0    2. tennis  0    3. Yard- work  0   4. stairs 5   5.    Score 1.25    Total score = sum of the activity scores/number of activities Minimum detectable change (90%CI) for average score = 2 points Minimum detectable change (90%CI) for single activity score = 3 points  SCREENING FOR RED FLAGS: Bowel or bladder incontinence: No Cauda equina syndrome: No  COGNITION: Overall cognitive status: WFL normal      SENSATION: 08/08/24: Rt great toe numbness   POSTURE:  rounded shoulders, forward head, and decreased lumbar lordosis  PALPATION: TTP: lumbar paraspinals, bil QL  LUMBAR ROM:     AROM Eval 08/08/24  Flexion 80  Extension 20  Right lateral flexion 38  Left lateral flexion 40  Right rotation WFL  Left rotation WFL, slightly more rotation on the left   (Blank rows = not tested)  LOWER EXTREMITY MMT:     MMT Right Eval HHD  Left Eval HHD  Hip flexion 36.5, 391 37.3, 38.7  Hip extension    Hip abduction    Hip adduction    Hip internal rotation    Hip external rotation    Knee flexion 46.1, 53.0 53.8, 61.0  Knee extension 76.9, 84.1 84.2, 87.1  Ankle  dorsiflexion    Ankle plantarflexion    Ankle inversion    Ankle eversion     (Blank rows = not tested)  LOWER EXTREMITY MMT:    ROM Right Eval 08/08/24 Left Eval 08/08/24  Hip flexion Active 90 Active 85  Hip extension    Hip abduction    Hip adduction    Hip internal rotation    Hip external rotation    Knee flexion    Knee extension    Ankle dorsiflexion    Ankle plantarflexion    Ankle inversion    Ankle eversion     (Blank rows = not tested)  LUMBAR SPECIAL TESTS:  Straight leg raise test: Negative on Rt and left  TODAY'S TREATMENT:                                                                                                         DATE: 08/08/24  Therex:    HEP instruction/performance c cues for techniques, handout provided.  Trial set performed of each for comprehension and symptom assessment.  See below for exercise list  PATIENT EDUCATION:  Education details: HEP, POC Person educated: Patient Education method: Explanation, Demonstration, Verbal cues, and Handouts Education comprehension: verbalized understanding, returned demonstration, and verbal cues required  HOME EXERCISE PROGRAM: Access Code: [email or web address] URL: [email or web address] Date: 08/08/2024 Prepared by: Delon Lunger  Exercises - Supine Bridge  - 2 x daily - 7 x weekly - 10 reps - 5 seconds hold - Quadruped Full Range Thoracic Rotation with Reach  - 1-2 x daily - 7 x weekly - 5-10 reps - Supine Piriformis Stretch with Foot on Ground  - 1-2 x daily - 7 x weekly - 3 sets - 10 reps - Supine Figure 4 Piriformis Stretch  - 1-2 x daily - 7 x weekly - 3 sets - 10 reps - Standing Quadratus Lumborum Stretch with Doorway  - 1-2 x daily - 7 x weekly - 3 sets - 10 reps - Standing  Hip Hiking  - 1-2 x daily - 7 x weekly - 10 reps - Standing Hip Flexor Stretch  - 1-2 x daily - 7 x weekly - 3 reps - 30 seconds hold  ASSESSMENT:  CLINICAL IMPRESSION: Patient is a 68 y.o. who comes to clinic with complaints of lumbar spine pain and gluteal pain with mobility, strength and movement coordination deficits that impair their ability to perform usual daily and recreational functional activities without increase difficulty/symptoms at this time.  Patient to benefit from skilled PT services to address impairments and limitations to improve to previous level of function without restriction secondary to condition.   OBJECTIVE IMPAIRMENTS: decreased balance, decreased ROM, decreased strength, impaired flexibility, postural dysfunction, and pain.   ACTIVITY LIMITATIONS: lifting, bending, standing, squatting, sleeping, and recreational sports  PARTICIPATION LIMITATIONS: community activity and yard work  PERSONAL FACTORS: 3+ comorbidities: see PMH above are also affecting patient's functional outcome.   REHAB POTENTIAL: Good  CLINICAL DECISION MAKING: Stable/uncomplicated  EVALUATION COMPLEXITY: Low   GOALS: Goals reviewed with patient? Yes  SHORT TERM GOALS: (target date for Short term goals are 3 weeks 08/29/2024)  1. Patient will demonstrate independent use of home exercise program to maintain progress from in clinic treatments.  Goal status: New  LONG TERM GOALS: (target dates for all long term goals are 10 weeks  10/20/24 )   1. Patient will demonstrate/report pain at worst less than or equal to 2/10 to facilitate minimal limitation in daily activity secondary to pain symptoms.  Goal status: New   2. Patient will demonstrate independent use of home exercise program to facilitate ability to maintain/progress functional gains from skilled physical therapy services.  Goal status: New   3. Patient will demonstrate Patient specific functional scale avg > or = 3.25 to  indicate reduced disability due to condition.   Goal status: New   4. Pt will be able to report returning to playing tennis with low back and hip pain of </= 3/10 at it's worse.   Goal status: New   5.  Patient will improve his bilateral active hip flexion to >/= 100 degrees for improved mobility.  Goal status: New   6.  Pt will demonstrate ascending and descending 1 flight of stairs with single hand rail with no pain noted.  Goal status: New    PLAN:  PT FREQUENCY: 1-2x/week  PT DURATION: 10 weeks  PLANNED INTERVENTIONS: Can include 02853- PT Re-evaluation, 97110-Therapeutic exercises, 97530- Therapeutic activity, [id number]- Neuromuscular re-education, 97535- Self Care, 97140- Manual therapy, [phone]- Gait training, [phone]- Orthotic Fit/training, [phone]- Canalith repositioning, [id number]- Aquatic Therapy, [phone]- Electrical stimulation (unattended), [id number] Physical performance testing, [id number]- Vasopneumatic device, [id number]- Ultrasound, [id number]- Traction (mechanical), [id number]- Ionotophoresis 4mg /ml Dexamethasone ,  [id number] - Needle insertion w/o injection 1 or 2 muscles, 20561 - Needle insertion w/o injection 3 or more muscles.    Patient/Family education, Balance training, Stair training, Taping, Dry Needling, Joint mobilization, Joint manipulation, Spinal manipulation, Spinal mobilization, Scar mobilization, Vestibular training, Visual/preceptual remediation/compensation, DME instructions, Cryotherapy, and Moist heat.  All performed as medically necessary.  All included unless contraindicated  PLAN FOR NEXT SESSION: Review HEP knowledge/results. Core strengthening, hip stretching (pt with tightness in bil hip rotators) assess SLS balance     Delon JONELLE Lunger, PT, MPT 08/08/2024, 12:28 PM

## 2024-08-09 NOTE — Telephone Encounter (Signed)
 [facility]  08/09/2024  9:22 AM  Confirmed I was speaking with Robert Blair [id number] by using name and DOB. Informed participant the reason for this call is to follow-up on a recent sample the participant provided at one of the [facility] lab locations. Informed participant the test was not able to be completed with this sample and apologized for the inconvenience. Participant was requested to provide a new sample at one of our participating labs at no cost so that participant can continue participation and receive test results. Informed participant they do not need to be fasting and if there are other samples that need to be drawn, they can be done at the same visit. Participant has not had a blood transfusion or blood product in the last 30 days. Participant agreed to provide another sample. Participant was provided the [employer] program website to learn why this may have happened. Participant was thanked for their time and continued support of the above study.    Jordyn Pennstrom, BS [place]  [facility] Clinical Research Specialist II Direct Dial: [phone]  Fax: [phone]

## 2024-08-17 ENCOUNTER — Ambulatory Visit: Admitting: Orthopedic Surgery

## 2024-08-31 ENCOUNTER — Ambulatory Visit: Admitting: Orthopedic Surgery

## 2024-08-31 DIAGNOSIS — M5416 Radiculopathy, lumbar region: Secondary | ICD-10-CM | POA: Diagnosis not present

## 2024-08-31 NOTE — Progress Notes (Signed)
 Orthopedic Spine Surgery Office Note   Assessment: Patient is a 68 y.o. male who was previously seen for a right sided foot drop. Comes in today with left buttock pain, left lateral thigh, and left medial leg pain - possible radiculopathy     Plan: -Patient has tried PT, medrol  dosepak, cone strengthening, intramuscular steroid injection -Recommended a diagnostic/therapeutic transforaminal injection.  Referral provided to them today -Patient should return to office 5-6 weeks, x-rays at next visit: none   ___________________________________________________________________________     History:   Patient is a 68 y.o. male who comes in today for follow up on his lumbar spine.  Patient continues to have left buttock pain.  He does notice progression of his pain to.  He is now having pain along the left lateral thigh and left medial leg.  He has no pain radiating to the right lower extremity.  He notes the pain on a daily basis.  He does not feel it at all times during the day but at some point that he will have the pain.   Treatments tried: medrol  dosepak, cone strengthening, intramuscular steroid injection     Physical Exam:   General: no acute distress, appears stated age Neurologic: alert, answering questions appropriately, following commands Respiratory: unlabored breathing on room air, symmetric chest rise Psychiatric: appropriate affect, normal cadence to speech     MSK (spine):   -Strength exam                                                   Left                  Right EHL                              5/5                  -/5 TA                                 5/5                  5/5 GSC                             5/5                  5/5 Knee extension            5/5                  5/5 Hip flexion                    5/5                  5/5   Has MTP fusion on the right   -Sensory exam                           Sensation intact to light touch in L3-S1 nerve  distributions of bilateral lower extremities   Imaging: XRs of the lumbar spine from 07/20/2024 were previously independently reviewed and interpreted, showing  disc height loss at L4/5 and L5/S1. No evidence of instability on flexion/extension views. No fracture or dislocation seen.    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  Disc bulging and hypertrophic ligamentum flavum at L4/5.     Patient name: Robert Blair Patient MRN: [id number] Date of visit: 08/31/2024

## 2024-09-20 NOTE — Discharge Instructions (Signed)

## 2024-09-21 ENCOUNTER — Ambulatory Visit
Admission: RE | Admit: 2024-09-21 | Discharge: 2024-09-21 | Disposition: A | Discharge status: Home / Self Care | Source: Ambulatory Visit | Attending: Orthopedic Surgery | Admitting: Orthopedic Surgery

## 2024-09-21 DIAGNOSIS — M5416 Radiculopathy, lumbar region: Secondary | ICD-10-CM

## 2024-09-21 MED ORDER — IOPAMIDOL (ISOVUE-M 200) INJECTION 41%
1.0000 mL | Freq: Once | INTRAMUSCULAR | Status: AC
  Administered 2024-09-21: 1 mL via EPIDURAL

## 2024-09-21 MED ORDER — METHYLPREDNISOLONE ACETATE 40 MG/ML INJ SUSP (RADIOLOG
80.0000 mg | Freq: Once | INTRAMUSCULAR | Status: AC
  Administered 2024-09-21: 80 mg via EPIDURAL
